# Patient Record
Sex: Female | Born: 1960
Health system: Southern US, Community
[De-identification: ages and names within clinical notes are randomized; demographics above are authoritative.]

## PROBLEM LIST (undated history)

## (undated) DIAGNOSIS — C4490 Unspecified malignant neoplasm of skin, unspecified: Secondary | ICD-10-CM

## (undated) DIAGNOSIS — G709 Myoneural disorder, unspecified: Secondary | ICD-10-CM

## (undated) DIAGNOSIS — R2681 Unsteadiness on feet: Secondary | ICD-10-CM

## (undated) DIAGNOSIS — F329 Major depressive disorder, single episode, unspecified: Secondary | ICD-10-CM

## (undated) DIAGNOSIS — F32A Depression, unspecified: Secondary | ICD-10-CM

## (undated) DIAGNOSIS — G35D Multiple sclerosis, unspecified: Secondary | ICD-10-CM

## (undated) DIAGNOSIS — Z9889 Other specified postprocedural states: Secondary | ICD-10-CM

## (undated) DIAGNOSIS — Z92241 Personal history of systemic steroid therapy: Secondary | ICD-10-CM

## (undated) DIAGNOSIS — G35 Multiple sclerosis: Secondary | ICD-10-CM

## (undated) DIAGNOSIS — R112 Nausea with vomiting, unspecified: Secondary | ICD-10-CM

## (undated) DIAGNOSIS — F419 Anxiety disorder, unspecified: Secondary | ICD-10-CM

## (undated) HISTORY — PX: BREAST EXCISIONAL BIOPSY: SUR124

## (undated) HISTORY — DX: Multiple sclerosis, unspecified: G35.D

## (undated) HISTORY — DX: Other specified postprocedural states: Z98.890

## (undated) HISTORY — PX: MELANOMA EXCISION: SHX5266

## (undated) HISTORY — PX: PARTIAL HYSTERECTOMY: SHX80

## (undated) HISTORY — PX: LEG SURGERY: SHX1003

## (undated) HISTORY — DX: Multiple sclerosis: G35

## (undated) HISTORY — DX: Unspecified malignant neoplasm of skin, unspecified: C44.90

## (undated) HISTORY — PX: TONSILLECTOMY: SUR1361

## (undated) HISTORY — PX: ABDOMINAL HYSTERECTOMY: SHX81

---

## 1998-09-11 ENCOUNTER — Other Ambulatory Visit: Admission: RE | Admit: 1998-09-11 | Discharge: 1998-09-11 | Payer: Self-pay | Admitting: *Deleted

## 2000-03-22 ENCOUNTER — Encounter: Admission: RE | Admit: 2000-03-22 | Discharge: 2000-04-21 | Payer: Self-pay | Admitting: Neurology

## 2000-09-02 ENCOUNTER — Other Ambulatory Visit: Admission: RE | Admit: 2000-09-02 | Discharge: 2000-09-02 | Payer: Self-pay | Admitting: *Deleted

## 2002-07-03 ENCOUNTER — Encounter: Admission: RE | Admit: 2002-07-03 | Discharge: 2002-07-03 | Payer: Self-pay | Admitting: Neurology

## 2002-07-03 ENCOUNTER — Encounter: Payer: Self-pay | Admitting: Neurology

## 2003-05-29 ENCOUNTER — Other Ambulatory Visit: Admission: RE | Admit: 2003-05-29 | Discharge: 2003-05-29 | Payer: Self-pay | Admitting: Obstetrics and Gynecology

## 2003-07-13 ENCOUNTER — Observation Stay (HOSPITAL_COMMUNITY): Admission: AD | Admit: 2003-07-13 | Discharge: 2003-07-14 | Payer: Self-pay | Admitting: Orthopedic Surgery

## 2003-07-13 ENCOUNTER — Encounter (INDEPENDENT_AMBULATORY_CARE_PROVIDER_SITE_OTHER): Payer: Self-pay | Admitting: *Deleted

## 2003-07-13 HISTORY — PX: DEBRIDEMENT SKIN / SQ / MUSCLE OF ARM: SUR392

## 2005-07-23 ENCOUNTER — Other Ambulatory Visit: Admission: RE | Admit: 2005-07-23 | Discharge: 2005-07-23 | Payer: Self-pay | Admitting: Obstetrics and Gynecology

## 2005-09-01 ENCOUNTER — Encounter: Admission: RE | Admit: 2005-09-01 | Discharge: 2005-09-01 | Payer: Self-pay | Admitting: Obstetrics and Gynecology

## 2006-08-26 ENCOUNTER — Encounter: Admission: RE | Admit: 2006-08-26 | Discharge: 2006-08-26 | Payer: Self-pay | Admitting: Obstetrics and Gynecology

## 2006-09-17 ENCOUNTER — Encounter: Admission: RE | Admit: 2006-09-17 | Discharge: 2006-09-17 | Payer: Self-pay | Admitting: Obstetrics and Gynecology

## 2007-10-21 ENCOUNTER — Encounter: Admission: RE | Admit: 2007-10-21 | Discharge: 2007-10-21 | Payer: Self-pay | Admitting: Obstetrics and Gynecology

## 2007-10-25 ENCOUNTER — Encounter: Admission: RE | Admit: 2007-10-25 | Discharge: 2007-10-25 | Payer: Self-pay | Admitting: Obstetrics and Gynecology

## 2008-12-11 ENCOUNTER — Encounter: Admission: RE | Admit: 2008-12-11 | Discharge: 2008-12-11 | Payer: Self-pay | Admitting: Obstetrics and Gynecology

## 2010-09-23 ENCOUNTER — Other Ambulatory Visit: Payer: Self-pay | Admitting: Obstetrics and Gynecology

## 2010-09-23 DIAGNOSIS — Z1231 Encounter for screening mammogram for malignant neoplasm of breast: Secondary | ICD-10-CM

## 2010-10-06 ENCOUNTER — Ambulatory Visit
Admission: RE | Admit: 2010-10-06 | Discharge: 2010-10-06 | Disposition: A | Discharge status: Home / Self Care | Payer: Medicare Other | Source: Ambulatory Visit | Attending: Obstetrics and Gynecology | Admitting: Obstetrics and Gynecology

## 2010-10-06 DIAGNOSIS — Z1231 Encounter for screening mammogram for malignant neoplasm of breast: Secondary | ICD-10-CM

## 2010-11-13 ENCOUNTER — Other Ambulatory Visit: Payer: Self-pay | Admitting: Dermatology

## 2010-11-14 NOTE — Op Note (Signed)
NAMEELAJAH, Tonya Powers                          ACCOUNT NO.:  [id number]   MEDICAL RECORD NO.:  [id number]                   PATIENT TYPE:  AMB   LOCATION:  DAY                                  FACILITY:  [facility]   PHYSICIAN:  Madlyn Frankel. Charlann Boxer, M.D.               DATE OF BIRTH:  06/29/1961   DATE OF PROCEDURE:  07/13/2003  DATE OF DISCHARGE:                                 OPERATIVE REPORT   PREOPERATIVE DIAGNOSIS:  Right arm, rule out abscess versus myonecrosis of  the right triceps.   POSTOPERATIVE DIAGNOSIS/FINDINGS:  No evidence of pocketed abscess fluid.  There was, however, significant fibrotic response to the posterior aspect of  the triceps muscle deep to the fascia and incorporating the fascia, most  likely a response to muscle injury and possible myonecrosis.   PROCEDURE:  I&D of right arm with debridement of 6 x 2 cm fibrotic band of  tissue within the triceps muscle.   SURGEON:  Durene Romans, MD   ASSISTANT:  Clarene Reamer, P.A.-C.   ANESTHESIA:  General.   BLOOD LOSS:  Minimal.   IV FLUIDS:  800.   INDICATION FOR PROCEDURE:  Tonya Powers is a very pleasant 50 year old female  with a medical history significant for multiple sclerosis, requiring  intramuscular injections with her current medication regimen.  She developed  in her right arm over the past 4-6 weeks pain and swelling that was not  necessarily associated with fever nor chills.  She had significant reduction  in her range of motion that was associated with the pain.  She was initially  evaluated, and there was concern for infection in the arm given her symptoms  and history.  Based on this, an MRI was ordered and revealed a rim-enhancing  mass, complex fluid collection, that was about 6 x 2 cm.  Based on this  information and lack of healing response, surgical intervention was  discussed for I&D of right triceps.  She consented for I&D.   PROCEDURE IN DETAIL:  The patient was brought to the operative  theatre.  Once adequate anesthesia was established, the patient was positioned in left  lateral position on the beanbag.  Right upper extremity was then prepped and  draped in a sterile fashion.  Based on the position of the mass which  correlated with her physical exam findings, a longitudinal incision was made  posteriorly along this area to allow for exposure.  Blunt dissection using  the Stevens scissors was carried down to the level of the fascia.  It was  immediately evident that there was significantly abnormal fascia at this  point.  An initial area of fascia was excised which allowed for exposure of  the triceps muscle.  Subfascial dissection was carried out to allow for  adequate exposure.  Upon entry of the fascia, a plane was developed in the  fascia and the triceps muscle to further allow  for identification of this  tissue.  Note that there was no release of any fluids indicating abscess.  More of this was a significant fibrotic band.  There was a large band of  this tissue within the triceps muscle itself.  Using the Metzenbaum  scissors, this area was excised and sent to pathology as well as for Gram  stain culture for evaluation.  Following debridement of all the contents and  confirming the location of this on the MRI, the wound was copiously  irrigated with 3 liters of normal saline solution with pulse lavage followed  by 500 mL Bacitracin-laden normal saline solution.  A medium Hemovac drain  was placed and taken out proximal in the arm.  The remaining fascial layer  was loosely reapproximated using a 2-0 PDS followed by further irrigation  and reapproximating the subcutaneous layer with a 2-0 PDS and 4-0 nylon on  the skin.  Based on the lack of significant abscess and concern for  infection, the whole wound was closed with a medium Hemovac drain placed.  The patient's wound was then dressed sterilely with Adaptic dressing sponges  and placed into a posterior splint.  A  posterior splint will be applied to  allow for the surgical wound to heal.  This will be in place for about 10  days.  The drain will be removed on postop day one.  Gram stain culture will  be followed in the hospital.   The patient tolerated the procedure without complication and was transferred  to the recovery room.                                               Madlyn Frankel Charlann Boxer, M.D.    MDO/MEDQ  D:  07/13/2003  T:  07/13/2003  Job:  [id number]

## 2011-05-15 ENCOUNTER — Other Ambulatory Visit: Payer: Self-pay | Admitting: Dermatology

## 2011-10-23 ENCOUNTER — Other Ambulatory Visit: Payer: Self-pay | Admitting: Obstetrics and Gynecology

## 2011-10-23 DIAGNOSIS — Z1231 Encounter for screening mammogram for malignant neoplasm of breast: Secondary | ICD-10-CM

## 2011-11-05 ENCOUNTER — Ambulatory Visit
Admission: RE | Admit: 2011-11-05 | Discharge: 2011-11-05 | Disposition: A | Discharge status: Home / Self Care | Payer: Medicare Other | Source: Ambulatory Visit | Attending: Obstetrics and Gynecology | Admitting: Obstetrics and Gynecology

## 2011-11-05 ENCOUNTER — Other Ambulatory Visit: Payer: Self-pay | Admitting: Obstetrics and Gynecology

## 2011-11-05 DIAGNOSIS — Z1231 Encounter for screening mammogram for malignant neoplasm of breast: Secondary | ICD-10-CM

## 2012-01-06 ENCOUNTER — Other Ambulatory Visit: Payer: Self-pay | Admitting: Obstetrics and Gynecology

## 2012-04-05 ENCOUNTER — Other Ambulatory Visit: Payer: Self-pay | Admitting: Obstetrics and Gynecology

## 2012-04-05 DIAGNOSIS — D249 Benign neoplasm of unspecified breast: Secondary | ICD-10-CM

## 2012-05-10 ENCOUNTER — Ambulatory Visit
Admission: RE | Admit: 2012-05-10 | Discharge: 2012-05-10 | Disposition: A | Discharge status: Home / Self Care | Payer: Medicare Other | Source: Ambulatory Visit | Attending: Obstetrics and Gynecology | Admitting: Obstetrics and Gynecology

## 2012-05-10 DIAGNOSIS — D249 Benign neoplasm of unspecified breast: Secondary | ICD-10-CM

## 2012-06-10 ENCOUNTER — Other Ambulatory Visit: Payer: Self-pay | Admitting: Dermatology

## 2012-08-02 ENCOUNTER — Ambulatory Visit
Admission: RE | Admit: 2012-08-02 | Discharge: 2012-08-02 | Disposition: A | Discharge status: Home / Self Care | Payer: Medicare Other | Source: Ambulatory Visit | Attending: Family Medicine | Admitting: Family Medicine

## 2012-08-02 ENCOUNTER — Other Ambulatory Visit: Payer: Self-pay | Admitting: Family Medicine

## 2012-08-02 DIAGNOSIS — J189 Pneumonia, unspecified organism: Secondary | ICD-10-CM

## 2012-09-05 ENCOUNTER — Ambulatory Visit (INDEPENDENT_AMBULATORY_CARE_PROVIDER_SITE_OTHER): Payer: Medicare Other | Admitting: General Surgery

## 2012-09-05 ENCOUNTER — Encounter (INDEPENDENT_AMBULATORY_CARE_PROVIDER_SITE_OTHER): Payer: Self-pay | Admitting: General Surgery

## 2012-09-05 VITALS — BP 118/76 | HR 82 | Resp 18 | Ht 65.0 in | Wt 131.0 lb

## 2012-09-05 DIAGNOSIS — N63 Unspecified lump in unspecified breast: Secondary | ICD-10-CM | POA: Insufficient documentation

## 2012-09-05 NOTE — Progress Notes (Signed)
Patient ID: Tonya Powers, female   DOB: [birth year], 52 y.o.   MRN: [id number]  Chief Complaint  Patient presents with  . Other    Eval right br mass    HPI Tonya Powers is a 52 y.o. female.  Referred by Dr Vincente Poli HPI This is a 52 year old female with a history of multiple sclerosis. She requires a cane and most of her symptoms are in her lower extremities. She has had a history of a right breast mass. This was noted in the past and she underwent in May of 2013 an ultrasound as well as a biopsy of a 1.6 cm solid mass. This biopsy was a fibroadenoma. A clip was placed at the same time. She then underwent followup ultrasound 6 months later which showed no significant change in the previously biopsied right breast fibroadenoma. She was recommended bilateral screening mammogram 6 months later. This area to her feels like it is gotten bigger and it is causing her some pain. She is also concerned about the clip that is left in place due to the fact that she has multiple sclerosis and she's had problems with any sort of implant in the past. She comes in today to discuss excision and this is what she desires. Past Medical History  Diagnosis Date  . Multiple sclerosis   . Skin cancer   . History of breast biopsy     Past Surgical History  Procedure Laterality Date  . Leg surgery      sx as a child  . Tonsillectomy      as a child  . Scar tissue removed from arm    . Partial hysterectomy      History reviewed. No pertinent family history.  Social History History  Substance Use Topics  . Smoking status: Never Smoker   . Smokeless tobacco: Not on file  . Alcohol Use: Yes    Allergies  Allergen Reactions  . Codeine Nausea And Vomiting    Current Outpatient Prescriptions  Medication Sig Dispense Refill  . ALPRAZolam (XANAX) 0.5 MG tablet       . amphetamine-dextroamphetamine (ADDERALL) 20 MG tablet       . diazepam (VALIUM) 5 MG tablet Take 5 mg by mouth every 6 (six) hours as  needed for anxiety.      . Dimethyl Fumarate (TECFIDERA) 240 MG CPDR Take by mouth.      . estradiol (VIVELLE-DOT) 0.05 MG/24HR Place 1 patch onto the skin once a week.      . gabapentin (NEURONTIN) 600 MG tablet Take 600 mg by mouth 3 (three) times daily.      . promethazine (PHENERGAN) 25 MG tablet Take 25 mg by mouth every 6 (six) hours as needed for nausea.      . tapentadol (NUCYNTA) 50 MG TABS Take by mouth.      . traMADol (ULTRAM) 50 MG tablet       . traZODone (DESYREL) 50 MG tablet Take 50 mg by mouth at bedtime.      Marland Kitchen zolpidem (AMBIEN) 10 MG tablet Take 10 mg by mouth at bedtime as needed for sleep.       No current facility-administered medications for this visit.    Review of Systems Review of Systems  Constitutional: Positive for fatigue. Negative for fever, chills and unexpected weight change.  HENT: Negative for hearing loss, congestion, sore throat, trouble swallowing and voice change.   Eyes: Negative for visual disturbance.  Respiratory: Negative for cough and  wheezing.   Cardiovascular: Negative for chest pain, palpitations and leg swelling.  Gastrointestinal: Negative for nausea, vomiting, abdominal pain, diarrhea, constipation, blood in stool, abdominal distention and anal bleeding.  Genitourinary: Positive for difficulty urinating. Negative for hematuria and vaginal bleeding.  Musculoskeletal: Negative for arthralgias.  Skin: Negative for rash and wound.  Neurological: Positive for dizziness and headaches. Negative for seizures and syncope.  Hematological: Negative for adenopathy. Bruises/bleeds easily.  Psychiatric/Behavioral: Negative for confusion.    Blood pressure 118/76, pulse 82, resp. rate 18, height 5\' 5"  (1.651 m), weight 131 lb (59.421 kg).  Physical Exam Physical Exam  Vitals reviewed. Constitutional: She appears well-developed and well-nourished.  Neck: Neck supple.  Cardiovascular: Normal rate, regular rhythm and normal heart sounds.     Pulmonary/Chest: Effort normal and breath sounds normal. She has no wheezes. She has no rales. Right breast exhibits mass. Right breast exhibits no inverted nipple, no nipple discharge, no skin change and no tenderness. Left breast exhibits no inverted nipple, no mass, no nipple discharge, no skin change and no tenderness. Breasts are symmetrical.    Lymphadenopathy:    She has no cervical adenopathy.    She has no axillary adenopathy.       Right: No supraclavicular adenopathy present.       Left: No supraclavicular adenopathy present.    Data Reviewed RIGHT BREAST ULTRASOUND  Comparison: Previous examinations, including the right breast  ultrasound and ultrasound guided core needle biopsy dated  11/05/2011.  On physical exam, there is an approximately 1.5 x 1.2 cm oval,  palpable mass in the 12:30 o'clock position of the right breast, 6  cm from the nipple.  Findings: Ultrasound is performed, showing a 1.6 x 1.4 x 0.9 cm  oval, horizontally oriented, smoothly marginated, macrolobulated  solid mass in the 12:30 o'clock position of the right breast, 6 cm  from the nipple. This contains two thin internal septations as  well as a biopsy marker clip artifact. This previously measured  1.6 x 1.3 x 0.9 cm.  IMPRESSION:  No significant change in the previously biopsied right breast  fibroadenoma. No evidence of malignancy. Annual screening  mammography is recommended. The patient will be due for her next  screening mammogram in 6 months.  RECOMMENDATION:  Bilateral screening mammogram in 6 months.   Assessment    Right breast mass     Plan    This area does appear to be a stable fibroadenoma. She thinks it has gotten bigger and it is symptomatic however though. She also is concerned that the clip is causing her some trouble. I told her today that I did not think a clip was causing her symptoms and would be very unlikely that this would cause anything. I do think it is reasonable  to consider excising this area as she is 52 years old with a breast mass that she thinks may be enlarging. I think he would be best to use a wire to ensure I removed the clip at the same time and she very much would like this to be removed also. We discussed a right breast wire localized excision of the mass as well as the clip. We discussed risks including bleeding and infection which is somewhat higher for her given her medical history.       WAKEFIELD,MATTHEW 09/05/2012, 3:53 PM

## 2012-09-13 ENCOUNTER — Encounter (HOSPITAL_BASED_OUTPATIENT_CLINIC_OR_DEPARTMENT_OTHER): Payer: Self-pay | Admitting: *Deleted

## 2012-09-14 ENCOUNTER — Other Ambulatory Visit: Payer: Self-pay

## 2012-09-14 ENCOUNTER — Encounter (HOSPITAL_BASED_OUTPATIENT_CLINIC_OR_DEPARTMENT_OTHER): Payer: Self-pay | Admitting: *Deleted

## 2012-09-14 ENCOUNTER — Encounter (HOSPITAL_BASED_OUTPATIENT_CLINIC_OR_DEPARTMENT_OTHER)
Admission: RE | Admit: 2012-09-14 | Discharge: 2012-09-14 | Disposition: A | Discharge status: Home / Self Care | Payer: Medicare Other | Source: Ambulatory Visit | Attending: General Surgery | Admitting: General Surgery

## 2012-09-14 LAB — CBC
HCT: 39.9 % (ref 36.0–46.0)
MCHC: 35.6 g/dL (ref 30.0–36.0)
Platelets: 229 10*3/uL (ref 150–400)
RDW: 12.6 % (ref 11.5–15.5)
WBC: 6.7 10*3/uL (ref 4.0–10.5)

## 2012-09-14 LAB — BASIC METABOLIC PANEL
BUN: 16 mg/dL (ref 6–23)
Chloride: 100 mEq/L (ref 96–112)
Creatinine, Ser: 0.42 mg/dL — ABNORMAL LOW (ref 0.50–1.10)
GFR calc Af Amer: >90 mL/min (ref >90)
GFR calc non Af Amer: >90 mL/min (ref >90)
Potassium: 3.4 mEq/L — ABNORMAL LOW (ref 3.5–5.1)

## 2012-09-20 ENCOUNTER — Encounter (HOSPITAL_BASED_OUTPATIENT_CLINIC_OR_DEPARTMENT_OTHER): Payer: Self-pay

## 2012-09-20 ENCOUNTER — Encounter (HOSPITAL_BASED_OUTPATIENT_CLINIC_OR_DEPARTMENT_OTHER)
Admission: RE | Disposition: A | Discharge status: Home / Self Care | Payer: Self-pay | Source: Ambulatory Visit | Attending: General Surgery

## 2012-09-20 ENCOUNTER — Ambulatory Visit
Admission: RE | Admit: 2012-09-20 | Discharge: 2012-09-20 | Disposition: A | Discharge status: Home / Self Care | Payer: Medicare Other | Source: Ambulatory Visit

## 2012-09-20 ENCOUNTER — Ambulatory Visit (HOSPITAL_BASED_OUTPATIENT_CLINIC_OR_DEPARTMENT_OTHER)
Admission: RE | Admit: 2012-09-20 | Discharge: 2012-09-20 | Disposition: A | Discharge status: Home / Self Care | Payer: Medicare Other | Source: Ambulatory Visit | Attending: General Surgery | Admitting: General Surgery

## 2012-09-20 ENCOUNTER — Ambulatory Visit (HOSPITAL_BASED_OUTPATIENT_CLINIC_OR_DEPARTMENT_OTHER): Payer: Medicare Other | Admitting: Anesthesiology

## 2012-09-20 ENCOUNTER — Other Ambulatory Visit (INDEPENDENT_AMBULATORY_CARE_PROVIDER_SITE_OTHER): Payer: Self-pay | Admitting: General Surgery

## 2012-09-20 ENCOUNTER — Ambulatory Visit
Admission: RE | Admit: 2012-09-20 | Discharge: 2012-09-20 | Disposition: A | Discharge status: Home / Self Care | Payer: Medicare Other | Source: Ambulatory Visit | Attending: General Surgery | Admitting: General Surgery

## 2012-09-20 ENCOUNTER — Encounter (HOSPITAL_BASED_OUTPATIENT_CLINIC_OR_DEPARTMENT_OTHER): Payer: Self-pay | Admitting: Anesthesiology

## 2012-09-20 DIAGNOSIS — N6019 Diffuse cystic mastopathy of unspecified breast: Secondary | ICD-10-CM | POA: Insufficient documentation

## 2012-09-20 DIAGNOSIS — D249 Benign neoplasm of unspecified breast: Secondary | ICD-10-CM | POA: Insufficient documentation

## 2012-09-20 DIAGNOSIS — N63 Unspecified lump in unspecified breast: Secondary | ICD-10-CM

## 2012-09-20 DIAGNOSIS — N6310 Unspecified lump in the right breast, unspecified quadrant: Secondary | ICD-10-CM

## 2012-09-20 DIAGNOSIS — Z79899 Other long term (current) drug therapy: Secondary | ICD-10-CM | POA: Insufficient documentation

## 2012-09-20 DIAGNOSIS — Z885 Allergy status to narcotic agent status: Secondary | ICD-10-CM | POA: Insufficient documentation

## 2012-09-20 DIAGNOSIS — Z85828 Personal history of other malignant neoplasm of skin: Secondary | ICD-10-CM | POA: Insufficient documentation

## 2012-09-20 DIAGNOSIS — G35 Multiple sclerosis: Secondary | ICD-10-CM | POA: Insufficient documentation

## 2012-09-20 HISTORY — DX: Myoneural disorder, unspecified: G70.9

## 2012-09-20 HISTORY — DX: Depression, unspecified: F32.A

## 2012-09-20 HISTORY — PX: BREAST BIOPSY: SHX20

## 2012-09-20 HISTORY — DX: Nausea with vomiting, unspecified: R11.2

## 2012-09-20 HISTORY — DX: Nausea with vomiting, unspecified: Z98.890

## 2012-09-20 HISTORY — DX: Anxiety disorder, unspecified: F41.9

## 2012-09-20 HISTORY — DX: Major depressive disorder, single episode, unspecified: F32.9

## 2012-09-20 SURGERY — BREAST BIOPSY WITH NEEDLE LOCALIZATION
Anesthesia: General | Site: Breast | Laterality: Right | Wound class: Clean

## 2012-09-20 MED ORDER — MEPERIDINE HCL 50 MG PO TABS: 50.0000 mg | ORAL_TABLET | Freq: Once | ORAL | Status: DC

## 2012-09-20 MED ORDER — BUPIVACAINE HCL (PF) 0.25 % IJ SOLN
INTRAMUSCULAR | Status: DC | PRN
  Administered 2012-09-20: 10 mL

## 2012-09-20 MED ORDER — PROMETHAZINE HCL 12.5 MG PO TABS: 12.5000 mg | ORAL_TABLET | Freq: Four times a day (QID) | ORAL | Status: DC | PRN

## 2012-09-20 MED ORDER — DEXAMETHASONE SODIUM PHOSPHATE 4 MG/ML IJ SOLN
INTRAMUSCULAR | Status: DC | PRN
  Administered 2012-09-20: 10 mg via INTRAVENOUS

## 2012-09-20 MED ORDER — MIDAZOLAM HCL 2 MG/2ML IJ SOLN: 1.0000 mg | INTRAMUSCULAR | Status: DC | PRN

## 2012-09-20 MED ORDER — ONDANSETRON HCL 4 MG/2ML IJ SOLN
INTRAMUSCULAR | Status: DC | PRN
  Administered 2012-09-20: 4 mg via INTRAVENOUS

## 2012-09-20 MED ORDER — ONDANSETRON HCL 4 MG/2ML IJ SOLN: 4.0000 mg | Freq: Once | INTRAMUSCULAR | Status: DC | PRN

## 2012-09-20 MED ORDER — LIDOCAINE HCL (CARDIAC) 20 MG/ML IV SOLN
INTRAVENOUS | Status: DC | PRN
  Administered 2012-09-20: 80 mg via INTRAVENOUS

## 2012-09-20 MED ORDER — FENTANYL CITRATE 0.05 MG/ML IJ SOLN
INTRAMUSCULAR | Status: DC | PRN
  Administered 2012-09-20: 50 ug via INTRAVENOUS

## 2012-09-20 MED ORDER — PROPOFOL 10 MG/ML IV BOLUS
INTRAVENOUS | Status: DC | PRN
  Administered 2012-09-20: 180 mg via INTRAVENOUS

## 2012-09-20 MED ORDER — LACTATED RINGERS IV SOLN
INTRAVENOUS | Status: DC
  Administered 2012-09-20 (×2): via INTRAVENOUS

## 2012-09-20 MED ORDER — OXYCODONE HCL 5 MG PO TABS: 5.0000 mg | ORAL_TABLET | Freq: Once | ORAL | Status: DC | PRN

## 2012-09-20 MED ORDER — MIDAZOLAM HCL 5 MG/5ML IJ SOLN
INTRAMUSCULAR | Status: DC | PRN
  Administered 2012-09-20: 2 mg via INTRAVENOUS

## 2012-09-20 MED ORDER — SCOPOLAMINE 1 MG/3DAYS TD PT72
1.0000 | MEDICATED_PATCH | TRANSDERMAL | Status: DC
  Administered 2012-09-20: 1.5 mg via TRANSDERMAL

## 2012-09-20 MED ORDER — EPHEDRINE SULFATE 50 MG/ML IJ SOLN
INTRAMUSCULAR | Status: DC | PRN
  Administered 2012-09-20: 10 mg via INTRAVENOUS

## 2012-09-20 MED ORDER — MEPERIDINE HCL 50 MG PO TABS: 50.0000 mg | ORAL_TABLET | Freq: Four times a day (QID) | ORAL | Status: DC | PRN

## 2012-09-20 MED ORDER — PROMETHAZINE HCL 25 MG PO TABS: 25.0000 mg | ORAL_TABLET | Freq: Four times a day (QID) | ORAL | Status: DC | PRN

## 2012-09-20 MED ORDER — FENTANYL CITRATE 0.05 MG/ML IJ SOLN: 50.0000 ug | INTRAMUSCULAR | Status: DC | PRN

## 2012-09-20 MED ORDER — OXYCODONE HCL 5 MG/5ML PO SOLN: 5.0000 mg | Freq: Once | ORAL | Status: DC | PRN

## 2012-09-20 MED ORDER — HYDROMORPHONE HCL PF 1 MG/ML IJ SOLN: 0.2500 mg | INTRAMUSCULAR | Status: DC | PRN

## 2012-09-20 MED ORDER — CEFAZOLIN SODIUM-DEXTROSE 2-3 GM-% IV SOLR
2.0000 g | INTRAVENOUS | Status: AC
  Administered 2012-09-20: 2 g via INTRAVENOUS

## 2012-09-20 SURGICAL SUPPLY — 54 items
ADH SKN CLS APL DERMABOND .7 (GAUZE/BANDAGES/DRESSINGS) ×1
APL SKNCLS STERI-STRIP NONHPOA (GAUZE/BANDAGES/DRESSINGS)
APPLIER CLIP 9.375 MED OPEN (MISCELLANEOUS)
APR CLP MED 9.3 20 MLT OPN (MISCELLANEOUS)
BENZOIN TINCTURE PRP APPL 2/3 (GAUZE/BANDAGES/DRESSINGS) ×1 IMPLANT
BINDER BREAST LRG (GAUZE/BANDAGES/DRESSINGS) IMPLANT
BINDER BREAST MEDIUM (GAUZE/BANDAGES/DRESSINGS) ×1 IMPLANT
BINDER BREAST XLRG (GAUZE/BANDAGES/DRESSINGS) IMPLANT
BINDER BREAST XXLRG (GAUZE/BANDAGES/DRESSINGS) IMPLANT
BLADE SURG 15 STRL LF DISP TIS (BLADE) ×1 IMPLANT
BLADE SURG 15 STRL SS (BLADE) ×2
CANISTER SUCTION 1200CC (MISCELLANEOUS) ×1 IMPLANT
CHLORAPREP W/TINT 26ML (MISCELLANEOUS) ×2 IMPLANT
CLIP APPLIE 9.375 MED OPEN (MISCELLANEOUS) IMPLANT
CLOTH BEACON ORANGE TIMEOUT ST (SAFETY) ×2 IMPLANT
COVER MAYO STAND STRL (DRAPES) ×2 IMPLANT
COVER TABLE BACK 60X90 (DRAPES) ×2 IMPLANT
DECANTER SPIKE VIAL GLASS SM (MISCELLANEOUS) IMPLANT
DERMABOND ADVANCED (GAUZE/BANDAGES/DRESSINGS) ×1
DERMABOND ADVANCED .7 DNX12 (GAUZE/BANDAGES/DRESSINGS) IMPLANT
DEVICE DUBIN W/COMP PLATE 8390 (MISCELLANEOUS) ×1 IMPLANT
DRAPE PED LAPAROTOMY (DRAPES) ×2 IMPLANT
DRSG TEGADERM 4X4.75 (GAUZE/BANDAGES/DRESSINGS) ×1 IMPLANT
ELECT COATED BLADE 2.86 ST (ELECTRODE) ×2 IMPLANT
ELECT REM PT RETURN 9FT ADLT (ELECTROSURGICAL) ×2
ELECTRODE REM PT RTRN 9FT ADLT (ELECTROSURGICAL) ×1 IMPLANT
GAUZE SPONGE 4X4 12PLY STRL LF (GAUZE/BANDAGES/DRESSINGS) ×2 IMPLANT
GLOVE BIO SURGEON STRL SZ7 (GLOVE) ×3 IMPLANT
GLOVE BIOGEL PI IND STRL 7.5 (GLOVE) ×1 IMPLANT
GLOVE BIOGEL PI INDICATOR 7.5 (GLOVE) ×1
GOWN PREVENTION PLUS XLARGE (GOWN DISPOSABLE) ×2 IMPLANT
GOWN PREVENTION PLUS XXLARGE (GOWN DISPOSABLE) ×1 IMPLANT
NDL HYPO 25X1 1.5 SAFETY (NEEDLE) ×1 IMPLANT
NEEDLE HYPO 25X1 1.5 SAFETY (NEEDLE) ×2 IMPLANT
NS IRRIG 1000ML POUR BTL (IV SOLUTION) IMPLANT
PACK BASIN DAY SURGERY FS (CUSTOM PROCEDURE TRAY) ×2 IMPLANT
PENCIL BUTTON HOLSTER BLD 10FT (ELECTRODE) ×2 IMPLANT
SLEEVE SCD COMPRESS KNEE MED (MISCELLANEOUS) ×2 IMPLANT
SPONGE LAP 4X18 X RAY DECT (DISPOSABLE) ×2 IMPLANT
STRIP CLOSURE SKIN 1/2X4 (GAUZE/BANDAGES/DRESSINGS) ×2 IMPLANT
SUT MNCRL AB 4-0 PS2 18 (SUTURE) IMPLANT
SUT MON AB 5-0 PS2 18 (SUTURE) IMPLANT
SUT SILK 2 0 SH (SUTURE) ×2 IMPLANT
SUT VIC AB 2-0 SH 27 (SUTURE) ×2
SUT VIC AB 2-0 SH 27XBRD (SUTURE) ×1 IMPLANT
SUT VIC AB 3-0 SH 27 (SUTURE) ×2
SUT VIC AB 3-0 SH 27X BRD (SUTURE) ×1 IMPLANT
SUT VIC AB 5-0 PS2 18 (SUTURE) IMPLANT
SUT VICRYL AB 3 0 TIES (SUTURE) IMPLANT
SYR CONTROL 10ML LL (SYRINGE) ×2 IMPLANT
TOWEL OR 17X24 6PK STRL BLUE (TOWEL DISPOSABLE) ×2 IMPLANT
TOWEL OR NON WOVEN STRL DISP B (DISPOSABLE) ×2 IMPLANT
TUBE CONNECTING 20X1/4 (TUBING) ×1 IMPLANT
YANKAUER SUCT BULB TIP NO VENT (SUCTIONS) ×1 IMPLANT

## 2012-09-20 NOTE — Anesthesia Preprocedure Evaluation (Addendum)
Anesthesia Evaluation  Patient identified by MRN, date of birth, ID band Patient awake    Reviewed: Allergy & Precautions, H&P , NPO status , Patient's Chart, lab work & pertinent test results  History of Anesthesia Complications (+) PONV  Airway Mallampati: I TM Distance: >3 FB Neck ROM: Full    Dental  (+) Teeth Intact and Dental Advisory Given   Pulmonary  breath sounds clear to auscultation        Cardiovascular Rhythm:Regular Rate:Normal     Neuro/Psych    GI/Hepatic   Endo/Other    Renal/GU      Musculoskeletal   Abdominal   Peds  Hematology   Anesthesia Other Findings   Reproductive/Obstetrics                           Anesthesia Physical Anesthesia Plan  ASA: II  Anesthesia Plan: General   Post-op Pain Management:    Induction: Intravenous  Airway Management Planned: LMA  Additional Equipment:   Intra-op Plan:   Post-operative Plan: Extubation in OR  Informed Consent: I have reviewed the patients History and Physical, chart, labs and discussed the procedure including the risks, benefits and alternatives for the proposed anesthesia with the patient or authorized representative who has indicated his/her understanding and acceptance.   Dental advisory given  Plan Discussed with: CRNA, Anesthesiologist and Surgeon  Anesthesia Plan Comments:         Anesthesia Quick Evaluation  

## 2012-09-20 NOTE — Anesthesia Postprocedure Evaluation (Signed)
  Anesthesia Post-op Note  Patient: Tonya Powers  Procedure(s) Performed: Procedure(s): BREAST BIOPSY WITH NEEDLE LOCALIZATION (Right)  Patient Location: PACU  Anesthesia Type:General  Level of Consciousness: awake, alert  and oriented  Airway and Oxygen Therapy: Patient Spontanous Breathing  Post-op Pain: mild  Post-op Assessment: Post-op Vital signs reviewed  Post-op Vital Signs: Reviewed  Complications: No apparent anesthesia complications

## 2012-09-20 NOTE — Interval H&P Note (Signed)
History and Physical Interval Note:  09/20/2012 10:51 AM  Tonya Powers  has presented today for surgery, with the diagnosis of right breast mass  The various methods of treatment have been discussed with the patient and family. After consideration of risks, benefits and other options for treatment, the patient has consented to  Procedure(s): BREAST BIOPSY WITH NEEDLE LOCALIZATION (Right) as a surgical intervention .  The patient's history has been reviewed, patient examined, no change in status, stable for surgery.  I have reviewed the patient's chart and labs.  Questions were answered to the patient's satisfaction.     Manas Hickling

## 2012-09-20 NOTE — Transfer of Care (Signed)
Immediate Anesthesia Transfer of Care Note  Patient: Tonya Powers  Procedure(s) Performed: Procedure(s): BREAST BIOPSY WITH NEEDLE LOCALIZATION (Right)  Patient Location: PACU  Anesthesia Type:General  Level of Consciousness: awake, oriented and patient cooperative  Airway & Oxygen Therapy: Patient Spontanous Breathing and Patient connected to face mask oxygen  Post-op Assessment: Report given to PACU RN and Post -op Vital signs reviewed and stable  Post vital signs: Reviewed and stable  Complications: No apparent anesthesia complications

## 2012-09-20 NOTE — Anesthesia Procedure Notes (Signed)
Procedure Name: LMA Insertion Date/Time: 09/20/2012 11:17 AM Performed by: Gar Gibbon Pre-anesthesia Checklist: Patient identified, Emergency Drugs available, Suction available and Patient being monitored Patient Re-evaluated:Patient Re-evaluated prior to inductionOxygen Delivery Method: Circle System Utilized Preoxygenation: Pre-oxygenation with 100% oxygen Intubation Type: IV induction Ventilation: Mask ventilation without difficulty LMA: LMA inserted LMA Size: 4.0 Number of attempts: 1 Airway Equipment and Method: bite block Placement Confirmation: positive ETCO2 Tube secured with: Tape Dental Injury: Teeth and Oropharynx as per pre-operative assessment

## 2012-09-20 NOTE — Op Note (Signed)
Preoperative diagnosis: Right breast mass with prior core biopsy Postoperative diagnosis: Same as above Procedure: Right breast mass wire-guided excisional biopsy Surgeon: Dr. Harden Mo Anesthesia: Gen. With LMA Estimated blood loss: Minimal Specimens: Right breast tissue with a short stitch superior, long stitch lateral, double stitch deep Complications: None Drains: None Sponge needle count correct at end of operation Disposition to recovery room in stable condition  Indications: This is a 52 year old female had a right breast mass noted as a prior benign biopsy with clip placement. She also has a history of multiple sclerosis and was concerned about the clip being in her We had a long discussion about her options and she very much would like both the mass and the clip removed at the same time. We elected to do a wire-guided biopsy to insure that the clip was removed at the same time.  Procedure: After informed consent was obtained the patient first had a wire placed at the breast center. I had these mammograms available for my review. She was administered 2 g of intravenous cefazolin. Sequential compression devices were placed on her legs. She then underwent general anesthesia with an LMA. Her right breast was prepped and draped in the standard sterile surgical fashion. Surgical timeout was performed.  I made a curvilinear incision overlying the mass. I then brought the wire into about 5 cm from away. I then used cautery to remove the mass and the surrounding tissue. The wire I cut off at the specimen. Once I had removed this I obtained a mammogram. Radiology also reviewed this. The clip, mass, and wire were all present in the sample. Hemostasis is then obtained. I closed the breast tissue with 2-0 Vicryl. The dermis was closed with 3-0 Vicryl and the skin with 4-0 Monocryl. I infiltrated 10 cc of quarter percent Marcaine. I then placed Dermabond and Steri-Strips. She tolerated this well was  extubated and transferred to the recovery room in stable condition.

## 2012-09-20 NOTE — H&P (View-Only) (Signed)
Patient ID: Tonya Powers, female   DOB: [birth year], 52 y.o.   MRN: [id number]  Chief Complaint  Patient presents with  . Other    Eval right br mass    HPI Tonya Powers is a 52 y.o. female.  Referred by Dr Vincente Poli HPI This is a 52 year old female with a history of multiple sclerosis. She requires a cane and most of her symptoms are in her lower extremities. She has had a history of a right breast mass. This was noted in the past and she underwent in May of 2013 an ultrasound as well as a biopsy of a 1.6 cm solid mass. This biopsy was a fibroadenoma. A clip was placed at the same time. She then underwent followup ultrasound 6 months later which showed no significant change in the previously biopsied right breast fibroadenoma. She was recommended bilateral screening mammogram 6 months later. This area to her feels like it is gotten bigger and it is causing her some pain. She is also concerned about the clip that is left in place due to the fact that she has multiple sclerosis and she's had problems with any sort of implant in the past. She comes in today to discuss excision and this is what she desires. Past Medical History  Diagnosis Date  . Multiple sclerosis   . Skin cancer   . History of breast biopsy     Past Surgical History  Procedure Laterality Date  . Leg surgery      sx as a child  . Tonsillectomy      as a child  . Scar tissue removed from arm    . Partial hysterectomy      History reviewed. No pertinent family history.  Social History History  Substance Use Topics  . Smoking status: Never Smoker   . Smokeless tobacco: Not on file  . Alcohol Use: Yes    Allergies  Allergen Reactions  . Codeine Nausea And Vomiting    Current Outpatient Prescriptions  Medication Sig Dispense Refill  . ALPRAZolam (XANAX) 0.5 MG tablet       . amphetamine-dextroamphetamine (ADDERALL) 20 MG tablet       . diazepam (VALIUM) 5 MG tablet Take 5 mg by mouth every 6 (six) hours as  needed for anxiety.      . Dimethyl Fumarate (TECFIDERA) 240 MG CPDR Take by mouth.      . estradiol (VIVELLE-DOT) 0.05 MG/24HR Place 1 patch onto the skin once a week.      . gabapentin (NEURONTIN) 600 MG tablet Take 600 mg by mouth 3 (three) times daily.      . promethazine (PHENERGAN) 25 MG tablet Take 25 mg by mouth every 6 (six) hours as needed for nausea.      . tapentadol (NUCYNTA) 50 MG TABS Take by mouth.      . traMADol (ULTRAM) 50 MG tablet       . traZODone (DESYREL) 50 MG tablet Take 50 mg by mouth at bedtime.      Marland Kitchen zolpidem (AMBIEN) 10 MG tablet Take 10 mg by mouth at bedtime as needed for sleep.       No current facility-administered medications for this visit.    Review of Systems Review of Systems  Constitutional: Positive for fatigue. Negative for fever, chills and unexpected weight change.  HENT: Negative for hearing loss, congestion, sore throat, trouble swallowing and voice change.   Eyes: Negative for visual disturbance.  Respiratory: Negative for cough and  wheezing.   Cardiovascular: Negative for chest pain, palpitations and leg swelling.  Gastrointestinal: Negative for nausea, vomiting, abdominal pain, diarrhea, constipation, blood in stool, abdominal distention and anal bleeding.  Genitourinary: Positive for difficulty urinating. Negative for hematuria and vaginal bleeding.  Musculoskeletal: Negative for arthralgias.  Skin: Negative for rash and wound.  Neurological: Positive for dizziness and headaches. Negative for seizures and syncope.  Hematological: Negative for adenopathy. Bruises/bleeds easily.  Psychiatric/Behavioral: Negative for confusion.    Blood pressure 118/76, pulse 82, resp. rate 18, height 5\' 5"  (1.651 m), weight 131 lb (59.421 kg).  Physical Exam Physical Exam  Vitals reviewed. Constitutional: She appears well-developed and well-nourished.  Neck: Neck supple.  Cardiovascular: Normal rate, regular rhythm and normal heart sounds.     Pulmonary/Chest: Effort normal and breath sounds normal. She has no wheezes. She has no rales. Right breast exhibits mass. Right breast exhibits no inverted nipple, no nipple discharge, no skin change and no tenderness. Left breast exhibits no inverted nipple, no mass, no nipple discharge, no skin change and no tenderness. Breasts are symmetrical.    Lymphadenopathy:    She has no cervical adenopathy.    She has no axillary adenopathy.       Right: No supraclavicular adenopathy present.       Left: No supraclavicular adenopathy present.    Data Reviewed RIGHT BREAST ULTRASOUND  Comparison: Previous examinations, including the right breast  ultrasound and ultrasound guided core needle biopsy dated  11/05/2011.  On physical exam, there is an approximately 1.5 x 1.2 cm oval,  palpable mass in the 12:30 o'clock position of the right breast, 6  cm from the nipple.  Findings: Ultrasound is performed, showing a 1.6 x 1.4 x 0.9 cm  oval, horizontally oriented, smoothly marginated, macrolobulated  solid mass in the 12:30 o'clock position of the right breast, 6 cm  from the nipple. This contains two thin internal septations as  well as a biopsy marker clip artifact. This previously measured  1.6 x 1.3 x 0.9 cm.  IMPRESSION:  No significant change in the previously biopsied right breast  fibroadenoma. No evidence of malignancy. Annual screening  mammography is recommended. The patient will be due for her next  screening mammogram in 6 months.  RECOMMENDATION:  Bilateral screening mammogram in 6 months.   Assessment    Right breast mass     Plan    This area does appear to be a stable fibroadenoma. She thinks it has gotten bigger and it is symptomatic however though. She also is concerned that the clip is causing her some trouble. I told her today that I did not think a clip was causing her symptoms and would be very unlikely that this would cause anything. I do think it is reasonable  to consider excising this area as she is 52 years old with a breast mass that she thinks may be enlarging. I think he would be best to use a wire to ensure I removed the clip at the same time and she very much would like this to be removed also. We discussed a right breast wire localized excision of the mass as well as the clip. We discussed risks including bleeding and infection which is somewhat higher for her given her medical history.       WAKEFIELD,MATTHEW 09/05/2012, 3:53 PM

## 2012-09-21 ENCOUNTER — Other Ambulatory Visit (INDEPENDENT_AMBULATORY_CARE_PROVIDER_SITE_OTHER): Payer: Self-pay | Admitting: General Surgery

## 2012-09-21 ENCOUNTER — Encounter (HOSPITAL_BASED_OUTPATIENT_CLINIC_OR_DEPARTMENT_OTHER): Payer: Self-pay | Admitting: General Surgery

## 2012-09-21 DIAGNOSIS — N63 Unspecified lump in unspecified breast: Secondary | ICD-10-CM

## 2012-09-22 ENCOUNTER — Other Ambulatory Visit (INDEPENDENT_AMBULATORY_CARE_PROVIDER_SITE_OTHER): Payer: Self-pay | Admitting: General Surgery

## 2012-09-22 ENCOUNTER — Telehealth (INDEPENDENT_AMBULATORY_CARE_PROVIDER_SITE_OTHER): Payer: Self-pay

## 2012-09-22 DIAGNOSIS — N6310 Unspecified lump in the right breast, unspecified quadrant: Secondary | ICD-10-CM

## 2012-09-22 NOTE — Telephone Encounter (Signed)
Called pt to let her know that her pathology report shows a fibroadenoma per Dr Dwain Sarna.

## 2012-09-30 ENCOUNTER — Ambulatory Visit (INDEPENDENT_AMBULATORY_CARE_PROVIDER_SITE_OTHER): Payer: Medicare Other | Admitting: General Surgery

## 2012-09-30 ENCOUNTER — Encounter (INDEPENDENT_AMBULATORY_CARE_PROVIDER_SITE_OTHER): Payer: Self-pay | Admitting: General Surgery

## 2012-09-30 VITALS — BP 112/70 | HR 84 | Temp 98.4°F | Resp 16 | Ht 65.0 in | Wt 130.0 lb

## 2012-09-30 DIAGNOSIS — Z09 Encounter for follow-up examination after completed treatment for conditions other than malignant neoplasm: Secondary | ICD-10-CM

## 2012-09-30 NOTE — Patient Instructions (Signed)

## 2012-09-30 NOTE — Progress Notes (Signed)
Subjective:     Patient ID: Tonya Powers, female   DOB: [birth year], 52 y.o.   MRN: [id number]  HPI 69 yof with right breast mass and clip from prior biopsy with history of ms.  I did wire localized biopsy of this area with removal of mass and clip.  She returns doing well today without any real complaints.  Her ms has flared postop.  Review of Systems     Objective:   Physical Exam Healing incision without infection    Assessment:     S/p right breast biopsy     Plan:     We discussed pathology and that clip was removed.  She can do all normal activity. Continue regular breast cancer screening and I will see back as needed

## 2013-01-19 ENCOUNTER — Other Ambulatory Visit: Payer: Self-pay | Admitting: Dermatology

## 2013-05-04 ENCOUNTER — Ambulatory Visit
Admission: RE | Admit: 2013-05-04 | Discharge: 2013-05-04 | Disposition: A | Discharge status: Home / Self Care | Payer: Medicare Other | Source: Ambulatory Visit | Attending: Family Medicine | Admitting: Family Medicine

## 2013-05-04 ENCOUNTER — Other Ambulatory Visit: Payer: Self-pay | Admitting: Family Medicine

## 2013-05-04 DIAGNOSIS — R05 Cough: Secondary | ICD-10-CM

## 2013-05-04 DIAGNOSIS — R059 Cough, unspecified: Secondary | ICD-10-CM

## 2014-02-02 ENCOUNTER — Other Ambulatory Visit: Payer: Self-pay

## 2014-02-02 DIAGNOSIS — Z1231 Encounter for screening mammogram for malignant neoplasm of breast: Secondary | ICD-10-CM

## 2014-02-09 ENCOUNTER — Ambulatory Visit
Admission: RE | Admit: 2014-02-09 | Discharge: 2014-02-09 | Disposition: A | Discharge status: Home / Self Care | Payer: Medicare Other | Source: Ambulatory Visit

## 2014-02-09 DIAGNOSIS — Z1231 Encounter for screening mammogram for malignant neoplasm of breast: Secondary | ICD-10-CM

## 2014-02-16 ENCOUNTER — Other Ambulatory Visit: Payer: Self-pay | Admitting: Dermatology

## 2014-06-12 ENCOUNTER — Other Ambulatory Visit: Payer: Self-pay | Admitting: Obstetrics and Gynecology

## 2014-06-13 LAB — CYTOLOGY - PAP

## 2014-06-15 ENCOUNTER — Other Ambulatory Visit: Payer: Self-pay | Admitting: Gastroenterology

## 2014-06-28 ENCOUNTER — Encounter (HOSPITAL_COMMUNITY): Payer: Self-pay | Admitting: *Deleted

## 2014-07-02 ENCOUNTER — Encounter (HOSPITAL_COMMUNITY): Payer: Self-pay | Admitting: *Deleted

## 2014-07-03 ENCOUNTER — Encounter (HOSPITAL_COMMUNITY): Payer: Self-pay | Admitting: *Deleted

## 2014-07-13 ENCOUNTER — Ambulatory Visit (HOSPITAL_COMMUNITY)
Admission: RE | Admit: 2014-07-13 | Discharge: 2014-07-13 | Disposition: A | Discharge status: Home / Self Care | Payer: Medicare Other | Source: Ambulatory Visit | Attending: Gastroenterology | Admitting: Gastroenterology

## 2014-07-13 ENCOUNTER — Ambulatory Visit (HOSPITAL_COMMUNITY): Payer: Medicare Other | Admitting: Certified Registered"

## 2014-07-13 ENCOUNTER — Encounter (HOSPITAL_COMMUNITY): Payer: Self-pay | Admitting: *Deleted

## 2014-07-13 ENCOUNTER — Encounter (HOSPITAL_COMMUNITY)
Admission: RE | Disposition: A | Discharge status: Home / Self Care | Payer: Self-pay | Source: Ambulatory Visit | Attending: Gastroenterology

## 2014-07-13 DIAGNOSIS — D120 Benign neoplasm of cecum: Secondary | ICD-10-CM | POA: Diagnosis not present

## 2014-07-13 DIAGNOSIS — R195 Other fecal abnormalities: Secondary | ICD-10-CM | POA: Diagnosis present

## 2014-07-13 DIAGNOSIS — K6389 Other specified diseases of intestine: Secondary | ICD-10-CM | POA: Diagnosis not present

## 2014-07-13 DIAGNOSIS — F419 Anxiety disorder, unspecified: Secondary | ICD-10-CM | POA: Diagnosis not present

## 2014-07-13 DIAGNOSIS — G35 Multiple sclerosis: Secondary | ICD-10-CM | POA: Diagnosis not present

## 2014-07-13 DIAGNOSIS — Z885 Allergy status to narcotic agent status: Secondary | ICD-10-CM | POA: Diagnosis not present

## 2014-07-13 DIAGNOSIS — Z85828 Personal history of other malignant neoplasm of skin: Secondary | ICD-10-CM | POA: Insufficient documentation

## 2014-07-13 DIAGNOSIS — F329 Major depressive disorder, single episode, unspecified: Secondary | ICD-10-CM | POA: Diagnosis not present

## 2014-07-13 DIAGNOSIS — D123 Benign neoplasm of transverse colon: Secondary | ICD-10-CM | POA: Diagnosis not present

## 2014-07-13 DIAGNOSIS — R2689 Other abnormalities of gait and mobility: Secondary | ICD-10-CM | POA: Insufficient documentation

## 2014-07-13 HISTORY — DX: Unsteadiness on feet: R26.81

## 2014-07-13 HISTORY — DX: Personal history of systemic steroid therapy: Z92.241

## 2014-07-13 HISTORY — PX: COLONOSCOPY WITH PROPOFOL: SHX5780

## 2014-07-13 SURGERY — COLONOSCOPY WITH PROPOFOL
Anesthesia: Monitor Anesthesia Care

## 2014-07-13 MED ORDER — SODIUM CHLORIDE 0.9 % IV SOLN: INTRAVENOUS | Status: DC

## 2014-07-13 MED ORDER — PROPOFOL 10 MG/ML IV BOLUS
INTRAVENOUS | Status: AC
  Filled 2014-07-13: qty 20

## 2014-07-13 MED ORDER — PROPOFOL 10 MG/ML IV BOLUS
INTRAVENOUS | Status: AC
Start: 2014-07-13 — End: 2014-07-13
  Filled 2014-07-13: qty 20

## 2014-07-13 MED ORDER — LIDOCAINE HCL (PF) 2 % IJ SOLN
INTRAMUSCULAR | Status: DC | PRN
  Administered 2014-07-13: 20 mg via INTRADERMAL

## 2014-07-13 MED ORDER — LACTATED RINGERS IV SOLN
INTRAVENOUS | Status: DC
  Administered 2014-07-13: 1000 mL via INTRAVENOUS

## 2014-07-13 MED ORDER — PROPOFOL 10 MG/ML IV BOLUS
INTRAVENOUS | Status: DC | PRN
  Administered 2014-07-13: 100 mg via INTRAVENOUS
  Administered 2014-07-13 (×2): 50 mg via INTRAVENOUS

## 2014-07-13 SURGICAL SUPPLY — 22 items

## 2014-07-13 NOTE — Anesthesia Postprocedure Evaluation (Signed)
  Anesthesia Post-op Note  Patient: Tonya Powers  Procedure(s) Performed: Procedure(s): COLONOSCOPY WITH PROPOFOL (N/A) Patient is awake and responsive. Pain and nausea are reasonably well controlled. Vital signs are stable and clinically acceptable. Oxygen saturation is clinically acceptable. There are no apparent anesthetic complications at this time. Patient is ready for discharge.

## 2014-07-13 NOTE — H&P (Signed)
  Tonya Powers HPI: This is a 54 year old female who was identified to have a positive stool DNA test on routine testing.  She is here today to undergo further evaluation with a colonoscopy.  Past Medical History  Diagnosis Date  . Multiple sclerosis   . Skin cancer   . History of breast biopsy   . PONV (postoperative nausea and vomiting)   . Anxiety   . Depression   . Neuromuscular disorder     MS  . Gait instability     uses wheelchair or assistance  . H/O steroid therapy     IV infusion every 6 to 8 weeks- last 05-31-14 [facility]    Past Surgical History  Procedure Laterality Date  . Leg surgery      sx as a child  . Tonsillectomy      as a child  . Partial hysterectomy    . Debridement skin / sq / muscle of arm Right 07/13/2003    I & D; debridement of tissue within triceps muscle  . Abdominal hysterectomy    . Melanoma excision    . Cesarean section    . Breast biopsy Right 09/20/2012    Procedure: BREAST BIOPSY WITH NEEDLE LOCALIZATION;  Surgeon: Rolm Bookbinder, MD;  Location: [place];  Service: General;  Laterality: Right;    History reviewed. No pertinent family history.  Social History:  reports that she has never smoked. She does not have any smokeless tobacco history on file. She reports that she drinks alcohol. She reports that she does not use illicit drugs.  Allergies:  Allergies  Allergen Reactions  . Codeine Nausea And Vomiting    Medications:  Scheduled:  Continuous: . sodium chloride    . lactated ringers 1,000 mL (07/13/14 0941)    No results found for this or any previous visit (from the past 24 hour(s)).   No results found.  ROS:  As stated above in the HPI otherwise negative.  Blood pressure 144/79, pulse 67, temperature 98.2 F (36.8 C), temperature source Oral, resp. rate 13, height 5\' 5"  (1.651 m), weight 58.968 kg (130 lb), SpO2 99 %.    PE: Gen: NAD, Alert and Oriented HEENT:  [place]/AT, EOMI Neck:  Supple, no LAD Lungs: CTA Bilaterally CV: RRR without M/G/R ABM: Soft, NTND, +BS Ext: No C/C/E  Assessment/Plan: 1) Positive stool DNA test.  Plan: 1) Colonoscopy.  Zackarey Holleman D 07/13/2014, 9:57 AM

## 2014-07-13 NOTE — Transfer of Care (Signed)
Immediate Anesthesia Transfer of Care Note  Patient: Tonya Powers  Procedure(s) Performed: Procedure(s) (LRB): COLONOSCOPY WITH PROPOFOL (N/A)  Patient Location: PACU  Anesthesia Type: MAC  Level of Consciousness: sedated, patient cooperative and responds to stimulation  Airway & Oxygen Therapy: Patient Spontanous Breathing and Patient connected to face mask oxgen  Post-op Assessment: Report given to PACU RN and Post -op Vital signs reviewed and stable  Post vital signs: Reviewed and stable  Complications: No apparent anesthesia complications

## 2014-07-13 NOTE — Op Note (Signed)
[place], [id number]   COLONOSCOPY PROCEDURE REPORT  PATIENT: [name], [name]  MR#: [id number] BIRTHDATE: [birth year] , 72  yrs. old GENDER: female ENDOSCOPIST: Carol Ada, MD REFERRED BY: PROCEDURE DATE:  2014/07/21 PROCEDURE:   Colonoscopy with snare polypectomy ASA CLASS:   Class III INDICATIONS: Positive Stool DNA MEDICATIONS: Monitored anesthesia care  DESCRIPTION OF PROCEDURE:   After the risks and benefits and of the procedure were explained, informed consent was obtained.  revealed no abnormalities of the rectum.    The EC-[id number] ([id number]) endoscope was introduced through the anus and advanced to the cecum, which was identified by both the appendix and ileocecal valve .  The quality of the prep was excellent. .  The instrument was then slowly withdrawn as the colon was fully examined.     FINDINGS: A 4 mm sessile cecal polyp was removed with a cold snare. A 2-3 mm sessile transverse colon polyp was removed with a cold snare.  There was evidence of a mild melanosis coli.  No evidence of any masses, ulcerations, erosions, or vascular abnormalities. Retroflexed views revealed no abnormalities.     The scope was then withdrawn from the patient and the procedure completed.  WITHDRAWAL TIME: 15 minutes 0 seconds  COMPLICATIONS: There were no immediate complications. ENDOSCOPIC IMPRESSION: 1) Polyps. 2) Mild melanosis coli.  RECOMMENDATIONS: 1) Await biopsy results. 2) Repeat the colonoscopy in 5 years.  REPEAT EXAM:  cc:  _______________________________ eSignedCarol Ada, MD 21-Jul-2014 10:39 AM   CPT CODES: ICD CODES:  The ICD and CPT codes recommended by this software are interpretations from the data that the clinical staff has captured with the software.  The verification of the translation of this report to the ICD and CPT codes and modifiers is the sole responsibility of the health care institution and  practicing physician where this report was generated.  [facility]. will not be held responsible for the validity of the ICD and CPT codes included on this report.  AMA assumes no liability for data contained or not contained herein. CPT is a Designer, television/film set of the [employer].   PATIENT NAME:  Tonya Powers, Tonya Powers MR#: [id number]

## 2014-07-13 NOTE — Anesthesia Preprocedure Evaluation (Signed)
Anesthesia Evaluation  Patient identified by MRN, date of birth, ID band Patient awake    Reviewed: Allergy & Precautions, H&P , NPO status , Patient's Chart, lab work & pertinent test results, reviewed documented beta blocker date and time   Airway Mallampati: II  TM Distance: >3 FB Neck ROM: full    Dental no notable dental hx. (+) Teeth Intact, Dental Advisory Given   Pulmonary  breath sounds clear to auscultation  Pulmonary exam normal       Cardiovascular Rhythm:regular Rate:Normal     Neuro/Psych  Neuromuscular disease    GI/Hepatic   Endo/Other    Renal/GU      Musculoskeletal   Abdominal   Peds  Hematology   Anesthesia Other Findings MS  Reproductive/Obstetrics                             Anesthesia Physical  Anesthesia Plan  ASA: II  Anesthesia Plan: MAC   Post-op Pain Management:    Induction: Intravenous  Airway Management Planned: Mask and Natural Airway  Additional Equipment:   Intra-op Plan:   Post-operative Plan: Extubation in OR  Informed Consent: I have reviewed the patients History and Physical, chart, labs and discussed the procedure including the risks, benefits and alternatives for the proposed anesthesia with the patient or authorized representative who has indicated his/her understanding and acceptance.   Dental Advisory Given  Plan Discussed with: CRNA and Surgeon  Anesthesia Plan Comments: (Discussed sedation and potential to need to place airway or ETT if warranted by clinical changes intra-operatively. We will start procedure as MAC.)        Anesthesia Quick Evaluation

## 2014-07-16 ENCOUNTER — Encounter (HOSPITAL_COMMUNITY): Payer: Self-pay | Admitting: Gastroenterology

## 2014-07-24 ENCOUNTER — Ambulatory Visit (INDEPENDENT_AMBULATORY_CARE_PROVIDER_SITE_OTHER): Payer: Medicare Other | Admitting: Neurology

## 2014-07-24 ENCOUNTER — Ambulatory Visit (INDEPENDENT_AMBULATORY_CARE_PROVIDER_SITE_OTHER): Payer: Medicare Other | Admitting: *Deleted

## 2014-07-24 ENCOUNTER — Encounter: Payer: Self-pay | Admitting: Neurology

## 2014-07-24 VITALS — BP 106/58 | HR 64 | Resp 12 | Wt 130.0 lb

## 2014-07-24 DIAGNOSIS — G35 Multiple sclerosis: Secondary | ICD-10-CM

## 2014-07-24 DIAGNOSIS — F5104 Psychophysiologic insomnia: Secondary | ICD-10-CM

## 2014-07-24 DIAGNOSIS — G4700 Insomnia, unspecified: Secondary | ICD-10-CM

## 2014-07-24 DIAGNOSIS — R5382 Chronic fatigue, unspecified: Secondary | ICD-10-CM

## 2014-07-24 DIAGNOSIS — F418 Other specified anxiety disorders: Secondary | ICD-10-CM | POA: Diagnosis not present

## 2014-07-24 DIAGNOSIS — R3919 Other difficulties with micturition: Secondary | ICD-10-CM

## 2014-07-24 DIAGNOSIS — R260 Ataxic gait: Secondary | ICD-10-CM | POA: Diagnosis not present

## 2014-07-24 DIAGNOSIS — R39198 Other difficulties with micturition: Secondary | ICD-10-CM

## 2014-07-24 DIAGNOSIS — R531 Weakness: Secondary | ICD-10-CM | POA: Insufficient documentation

## 2014-07-24 MED ORDER — SODIUM CHLORIDE 0.9 % IV SOLN
1000.0000 mg | INTRAVENOUS | Status: DC
  Administered 2014-07-24: 1000 mg via INTRAVENOUS

## 2014-07-24 MED ORDER — AMPHETAMINE-DEXTROAMPHETAMINE 20 MG PO TABS: 20.0000 mg | ORAL_TABLET | Freq: Two times a day (BID) | ORAL | Status: DC

## 2014-07-24 MED ORDER — BACLOFEN 10 MG PO TABS: 10.0000 mg | ORAL_TABLET | Freq: Three times a day (TID) | ORAL | Status: DC

## 2014-07-24 MED ORDER — DIAZEPAM 5 MG PO TABS: 5.0000 mg | ORAL_TABLET | Freq: Four times a day (QID) | ORAL | Status: DC | PRN

## 2014-07-24 NOTE — Patient Instructions (Signed)
Pt tolerated infusion well.  Will call back prn/fim

## 2014-07-24 NOTE — Progress Notes (Signed)
[facility]  PATIENT: Tonya Powers DOB: 1960/07/11  REFERRING CLINICIAN: Mayra Neer is PCP HISTORY FROM: Patient  REASON FOR VISIT: MS and poor gait   HISTORICAL  CHIEF COMPLAINT:  Chief Complaint  Patient presents with  . Multiple Sclerosis    Sts. fatigue, gait/balance, lbp and bilat leg pain are worse.  Also sts. having more frequent "adderall h/a's"/fim    HISTORY OF PRESENT ILLNESS:  Tonya Powers is a 54 year old woman who presented with optic neuritis in 1991 followed shortly by difficulties with leg weakness and by right trigeminal neuralgia. In retrospect, couple years earlier when she had her daughter, has some difficulties with her legs and needed to go on short-term disability. At first she was not diagnosed with MS but after more of the symptoms she underwent MRI testing and had a lumbar puncture by Dr. Johnnye Sima. The imaging and the CSF was consistent with multiple sclerosis. When Betaseron became available she was placed on that. She was on Betaseron for about 10 years. She felt that she did not have too many exacerbations during that time but she had a lot of difficulty tolerating the Betaseron due to skin reactions. Around 12-15 years ago, she started to use a cane and she has had progressive gait disturbance over the last decade. Around the house for short distances, she uses a walker but uses her scooter for longer distances. Outside she uses a wheelchair pushed by others.  Her main problems with the MS include take weakness in her legs leading to a poor gait and pain in the large muscles of the leg. She also has a lot of fatigue and difficulties with bladder and bowels.  Leg issues began in 1989 when she had some difficulties with gait after delivering. Noted that there is stiffness in her legs since that time. The stiffness has progressively worsened over the last decade. Leg weakness and spasticity is fairly symmetric. Pain has escalated quite a  bit over the last 10 years and is located mostly around the hip and buttock regions and down her leg somewhat. When he do the makes pain worse pain increases when she stands and is generally best when she is bearing no weight.  She does take some benzodiazepines. She takes clonazepam 1 mg every night. This helps her leg spasms as well as helps her to fall asleep. She takes Valium only every day or 2 as needed when she has more pain. She takes Xanax 0.5 mg when she has more anxiety. She does not take all 3 medicines in 1 day. Nucynta 50 mg after lunch has helped the pain some. She does not take more because of its expense. She takes tramadol up to 3 times a day. She does not tolerate opiates because of nausea. She has been on gabapentin for many years and is currently on 800 mg 3 or 4 times a day with benefit.  She reports latter frequency and urgency but also has urinary hesitancy and needs to push on her bladder to empty better. She feels she never completely empties. She gets frequent urinary tract infections. Taking desmopressin has greatly helped her nocturia and that helps her sleep better. She has also had constipation for many years. She has tried Linzess for her constipation but finds it only helps her for a few days. Senokot helps him as well.  She does not report any major problems with her vision. She needs to wear reading glasses but does not note any significant difficulty  with acuity. She does not have color desaturation in either eye. She does not have eye pain.  She also has a lot of difficulties with fatigue. This is helped best with Solu-Medrol infusions intermittently.  She also takes Adderall 20 mg, once most days but can take up to 2 a day. She reports that her fatigue is more physical and cognitive.  She notes both depression and anxiety. Her depression is usually worse when she has more pain and she becomes irritable. He has been an issue off and on and is usually worse when there is  more stress going on. Her anxiety was especially bad around the holidays when there were out-of-town guests.  She notes difficulty with insomnia, both sleep onset and sleep maintenance. He used to be able to sleep well with just Ambien but now needs to take both Ambien and clonazepam before bedtime. She does not report any problems with a hangover the next day.   REVIEW OF SYSTEMS:  Constitutional: No fevers, chills, sweats, or change in appetite.  Has Fatigue Eyes: No visual changes, double vision, eye pain Ear, nose and throat: No hearing loss, ear pain, nasal congestion, sore throat Cardiovascular: No chest pain, palpitations Respiratory:  No shortness of breath at rest or with exertion.   No wheezes GastrointestinaI: Mild dysphagia.  Constipation.  No nausea, vomiting, diarrhea.   Genitourinary:  No dysuria but has urinary retention and frequency.  Desmopressin helps nocturia. Musculoskeletal:  No neck pain,   Has lower back pain and buttocks.  Hips ans other joints ok Integumentary: No rash, pruritus, skin lesions Neurological: as above Psychiatric: see above Endocrine: No palpitations, diaphoresis, change in appetite, change in weigh or increased thirst Hematologic/Lymphatic:  No anemia, purpura, petechiae. Allergic/Immunologic: No itchy/runny eyes, nasal congestion, recent allergic reactions, rashes  ALLERGIES: Allergies  Allergen Reactions  . Codeine Nausea And Vomiting    HOME MEDICATIONS: Outpatient Prescriptions Prior to Visit  Medication Sig Dispense Refill  . ALPRAZolam (XANAX) 0.5 MG tablet Take 0.5 mg by mouth daily as needed for anxiety.     Marland Kitchen amphetamine-dextroamphetamine (ADDERALL) 20 MG tablet Take 20 mg by mouth every morning.     . Cholecalciferol (VITAMIN D PO) Take 1 tablet by mouth every morning.    . clonazePAM (KLONOPIN) 1 MG tablet Take 1 mg by mouth at bedtime as needed for anxiety (sleep).    Marland Kitchen desmopressin (DDAVP) 0.1 MG tablet Take 0.1 mg by mouth at  bedtime as needed (bladder.).    Marland Kitchen diazepam (VALIUM) 5 MG tablet Take 5 mg by mouth every 6 (six) hours as needed for anxiety.    . gabapentin (NEURONTIN) 800 MG tablet Take 800 mg by mouth 4 (four) times daily.    . MethylPREDNISolone Sodium Succ (SOLU-MEDROL IJ) Inject 1 each as directed. Infusion every 6-8 weeks    . promethazine (PHENERGAN) 25 MG tablet Take 25 mg by mouth every 6 (six) hours as needed for nausea.    . tapentadol (NUCYNTA) 50 MG TABS Take 50 mg by mouth 2 (two) times daily as needed for moderate pain.     . traMADol (ULTRAM) 50 MG tablet Take 50 mg by mouth 3 (three) times daily as needed for moderate pain.     Marland Kitchen zolpidem (AMBIEN) 10 MG tablet Take 10 mg by mouth at bedtime as needed for sleep.    Marland Kitchen estradiol (VIVELLE-DOT) 0.05 MG/24HR Place 1 patch onto the skin once a week. Tuesday.    Marland Kitchen ibuprofen (ADVIL,MOTRIN) 200 MG  tablet Take 400 mg by mouth every 6 (six) hours as needed.    . meperidine (DEMEROL) 50 MG tablet Take 1 tablet (50 mg total) by mouth every 6 (six) hours as needed for pain. (Patient not taking: Reported on 06/25/2014) 10 tablet 0  . promethazine (PHENERGAN) 12.5 MG tablet Take 1 tablet (12.5 mg total) by mouth every 6 (six) hours as needed for nausea. (Patient not taking: Reported on 06/25/2014) 10 tablet 0   No facility-administered medications prior to visit.    PAST MEDICAL HISTORY: Past Medical History  Diagnosis Date  . Multiple sclerosis   . Skin cancer   . History of breast biopsy   . PONV (postoperative nausea and vomiting)   . Anxiety   . Depression   . Neuromuscular disorder     MS  . Gait instability     uses wheelchair or assistance  . H/O steroid therapy     IV infusion every 6 to 8 weeks- last 05-31-14 [facility]    PAST SURGICAL HISTORY: Past Surgical History  Procedure Laterality Date  . Leg surgery      sx as a child  . Tonsillectomy      as a child  . Partial hysterectomy    . Debridement skin / sq / muscle  of arm Right 07/13/2003    I & D; debridement of tissue within triceps muscle  . Abdominal hysterectomy    . Melanoma excision    . Cesarean section    . Breast biopsy Right 09/20/2012    Procedure: BREAST BIOPSY WITH NEEDLE LOCALIZATION;  Surgeon: Rolm Bookbinder, MD;  Location: [place];  Service: General;  Laterality: Right;  . Colonoscopy with propofol N/A 07/13/2014    Procedure: COLONOSCOPY WITH PROPOFOL;  Surgeon: Beryle Beams, MD;  Location: [facility];  Service: Endoscopy;  Laterality: N/A;    FAMILY HISTORY: Family History  Problem Relation Age of Onset  . Hyperlipidemia Mother   . Hypertension Mother   . Hypertension Father   . Diabetes type II Father     SOCIAL HISTORY:  History   Social History  . Marital Status: Married    Spouse Name: N/A    Number of Children: N/A  . Years of Education: N/A   Occupational History  . Not on file.   Social History Main Topics  . Smoking status: Never Smoker   . Smokeless tobacco: Not on file  . Alcohol Use: Yes     Comment: social  . Drug Use: No  . Sexual Activity: Not on file   Other Topics Concern  . Not on file   Social History Narrative     PHYSICAL EXAM  Filed Vitals:   07/24/14 1542  BP: 106/58  Pulse: 64  Resp: 12  Weight: 130 lb (58.968 kg)    Body mass index is 21.63 kg/(m^2).   General: The patient is well-developed and well-nourished and in no acute distress  Eyes:  Funduscopic exam shows normal optic discs and retinal vessels.  Neck: The neck is supple, no carotid bruits are noted.  The neck is nontender.  Respiratory: The respiratory examination is clear.  Cardiovascular: The cardiovascular examination reveals a regular rate and rhythm, no murmurs, gallops or rubs are noted.  Skin: Extremities are without significant edema.  Neurologic Exam  Mental status: The patient is alert and oriented x 3 at the time of the examination. The patient has apparent normal  recent and remote memory, with an  apparently normal attention span and concentration ability.   Speech is normal.  Cranial nerves: Extraocular movements are full. Pupils are equal, round, and reactive to light and accomodation.  Visual fields are full.  Facial symmetry is present. There is good facial sensation to soft touch bilaterally.Facial strength is normal.  Trapezius and sternocleidomastoid strength is normal. No dysarthria is noted.  The tongue is midline, and the patient has symmetric elevation of the soft palate. No obvious hearing deficits are noted.  Motor:  Muscle bulk and tone are normal. Strength is  3 / 5 in proximal legs and 4/5 distally.   Sensory: Sensory testing is intact to pinprick, soft touch, vibration sensation, and position sense i arms but mild decreased touch in left leg.   .  Coordination: Cerebellar testing reveals reduced finger-nose-finger and poor heel-to-shin bilaterally.  Gait and station: She needs to use arms to stand.    Station is unsteady and gait requires support.  She can not tandem.  Romberg is negative.   Reflexes: Deep tendon reflexes are symmetric and increased in legs bilaterally. Plantar responses are normal.    DIAGNOSTIC DATA (LABS, IMAGING, TESTING) - I reviewed patient records, labs, notes, testing and imaging myself where available.  Lab Results  Component Value Date   WBC 6.7 09/14/2012   HGB 14.2 09/14/2012   HCT 39.9 09/14/2012   MCV 91.3 09/14/2012   PLT 229 09/14/2012      Component Value Date/Time   NA 139 09/14/2012 1500   K 3.4* 09/14/2012 1500   CL 100 09/14/2012 1500   CO2 30 09/14/2012 1500   GLUCOSE 107* 09/14/2012 1500   BUN 16 09/14/2012 1500   CREATININE 0.42* 09/14/2012 1500   CALCIUM 9.9 09/14/2012 1500   GFRNONAA >90 09/14/2012 1500   GFRAA >90 09/14/2012 1500      ASSESSMENT AND PLAN  Multiple sclerosis  Ataxic gait  Chronic fatigue  Urinary dysfunction  Chronic insomnia  Depression with  anxiety  In summary, Laquiesha Piacente is a 54 year old woman with multiple sclerosis who has had worsening fatigue and mood issues over the past few months. In the past, her fatigue has benefited some from a dose of IV Solu-Medrol and I will have her get 1 g of Solu-Medrol while she is here today. She is advised to stay active but to use the walker or her scooter while in the house for safety.    I will refill her medications and she will continue the stimulant for her fatigue and for decreased attention.  She will continue Nucynta for pain and knows not to take more than the prescribed dose. I'll also add baclofen to see if I can spasticity will improve. This may also help her insomnia at night.  She will return to see me in 4 months or sooner if she has new or worsening neurologic symptoms.   Richard A. Felecia Shelling, MD, PhD 9/39/0300, 9:23 PM Certified in Neurology, Clinical Neurophysiology, Sleep Medicine, Pain Medicine and Neuroimaging  [facility] [address] [place], [place] [address] [phone]

## 2014-07-25 ENCOUNTER — Ambulatory Visit: Payer: Self-pay | Admitting: Neurology

## 2014-08-02 ENCOUNTER — Ambulatory Visit: Payer: Self-pay | Admitting: Neurology

## 2014-08-16 ENCOUNTER — Inpatient Hospital Stay (HOSPITAL_COMMUNITY)
Admission: EM | Admit: 2014-08-16 | Discharge: 2014-08-18 | DRG: 392 | Disposition: A | Discharge status: Home / Self Care | Payer: Medicare Other | Attending: Internal Medicine | Admitting: Internal Medicine

## 2014-08-16 ENCOUNTER — Encounter (HOSPITAL_COMMUNITY): Payer: Self-pay | Admitting: Internal Medicine

## 2014-08-16 ENCOUNTER — Telehealth: Payer: Self-pay | Admitting: Neurology

## 2014-08-16 DIAGNOSIS — R32 Unspecified urinary incontinence: Secondary | ICD-10-CM | POA: Diagnosis present

## 2014-08-16 DIAGNOSIS — Z79899 Other long term (current) drug therapy: Secondary | ICD-10-CM | POA: Diagnosis not present

## 2014-08-16 DIAGNOSIS — G35 Multiple sclerosis: Secondary | ICD-10-CM | POA: Diagnosis present

## 2014-08-16 DIAGNOSIS — Z85820 Personal history of malignant melanoma of skin: Secondary | ICD-10-CM | POA: Diagnosis not present

## 2014-08-16 DIAGNOSIS — R197 Diarrhea, unspecified: Secondary | ICD-10-CM

## 2014-08-16 DIAGNOSIS — E860 Dehydration: Secondary | ICD-10-CM | POA: Diagnosis present

## 2014-08-16 DIAGNOSIS — E876 Hypokalemia: Secondary | ICD-10-CM

## 2014-08-16 DIAGNOSIS — Z993 Dependence on wheelchair: Secondary | ICD-10-CM

## 2014-08-16 DIAGNOSIS — R112 Nausea with vomiting, unspecified: Secondary | ICD-10-CM | POA: Diagnosis present

## 2014-08-16 DIAGNOSIS — Z833 Family history of diabetes mellitus: Secondary | ICD-10-CM

## 2014-08-16 DIAGNOSIS — A084 Viral intestinal infection, unspecified: Principal | ICD-10-CM | POA: Diagnosis present

## 2014-08-16 DIAGNOSIS — Z8249 Family history of ischemic heart disease and other diseases of the circulatory system: Secondary | ICD-10-CM | POA: Diagnosis not present

## 2014-08-16 DIAGNOSIS — K529 Noninfective gastroenteritis and colitis, unspecified: Secondary | ICD-10-CM

## 2014-08-16 DIAGNOSIS — R109 Unspecified abdominal pain: Secondary | ICD-10-CM

## 2014-08-16 LAB — BASIC METABOLIC PANEL
Anion gap: 8 (ref 5–15)
BUN: 19 mg/dL (ref 6–23)
CALCIUM: 8.7 mg/dL (ref 8.4–10.5)
CO2: 22 mmol/L (ref 19–32)
Chloride: 110 mmol/L (ref 96–112)
Creatinine, Ser: 0.42 mg/dL — ABNORMAL LOW (ref 0.50–1.10)
GFR calc Af Amer: >90 mL/min (ref >90)
GFR calc non Af Amer: >90 mL/min (ref >90)
GLUCOSE: 122 mg/dL — AB (ref 70–99)
POTASSIUM: 2.8 mmol/L — AB (ref 3.5–5.1)
SODIUM: 140 mmol/L (ref 135–145)

## 2014-08-16 LAB — CBC WITH DIFFERENTIAL/PLATELET
Basophils Absolute: 0 10*3/uL (ref 0.0–0.1)
Basophils Relative: 0 % (ref 0–1)
EOS ABS: 0 10*3/uL (ref 0.0–0.7)
EOS PCT: 0 % (ref 0–5)
HEMATOCRIT: 42.2 % (ref 36.0–46.0)
Hemoglobin: 14.1 g/dL (ref 12.0–15.0)
LYMPHS ABS: 0.3 10*3/uL — AB (ref 0.7–4.0)
LYMPHS PCT: 2 % — AB (ref 12–46)
MCH: 31.3 pg (ref 26.0–34.0)
MCHC: 33.4 g/dL (ref 30.0–36.0)
MCV: 93.6 fL (ref 78.0–100.0)
MONO ABS: 0.3 10*3/uL (ref 0.1–1.0)
MONOS PCT: 3 % (ref 3–12)
Neutro Abs: 10.5 10*3/uL — ABNORMAL HIGH (ref 1.7–7.7)
Neutrophils Relative %: 95 % — ABNORMAL HIGH (ref 43–77)
Platelets: 208 10*3/uL (ref 150–400)
RBC: 4.51 MIL/uL (ref 3.87–5.11)
RDW: 12.4 % (ref 11.5–15.5)
WBC: 11.1 10*3/uL — AB (ref 4.0–10.5)

## 2014-08-16 MED ORDER — SODIUM CHLORIDE 0.9 % IV BOLUS (SEPSIS)
1000.0000 mL | Freq: Once | INTRAVENOUS | Status: AC
  Administered 2014-08-16: 1000 mL via INTRAVENOUS

## 2014-08-16 MED ORDER — ONDANSETRON HCL 4 MG/2ML IJ SOLN
4.0000 mg | Freq: Once | INTRAMUSCULAR | Status: AC
  Administered 2014-08-16: 4 mg via INTRAVENOUS
  Filled 2014-08-16: qty 2

## 2014-08-16 MED ORDER — POTASSIUM CHLORIDE CRYS ER 20 MEQ PO TBCR
40.0000 meq | EXTENDED_RELEASE_TABLET | Freq: Once | ORAL | Status: AC
  Administered 2014-08-16: 40 meq via ORAL
  Filled 2014-08-16: qty 2

## 2014-08-16 MED ORDER — FENTANYL CITRATE 0.05 MG/ML IJ SOLN
50.0000 ug | Freq: Once | INTRAMUSCULAR | Status: AC
  Administered 2014-08-16: 50 ug via INTRAVENOUS
  Filled 2014-08-16: qty 2

## 2014-08-16 MED ORDER — PANTOPRAZOLE SODIUM 40 MG IV SOLR
40.0000 mg | Freq: Once | INTRAVENOUS | Status: AC
  Administered 2014-08-16: 40 mg via INTRAVENOUS
  Filled 2014-08-16: qty 40

## 2014-08-16 NOTE — ED Notes (Signed)
MD at bedside. 

## 2014-08-16 NOTE — ED Notes (Signed)
Pt tolerating fluids.   

## 2014-08-16 NOTE — ED Provider Notes (Addendum)
CSN: [id number]     Arrival date & time 08/16/14  1605 History   First MD Initiated Contact with Patient 08/16/14 1609     Chief Complaint  Patient presents with  . Nausea  . Diarrhea     (Consider location/radiation/quality/duration/timing/severity/associated sxs/prior Treatment) HPI.....multiple bouts of nausea and diarrhea since approximately 0500.  Patient has multiple sclerosis. She has been so weak today it has been difficult for her to stand and walk. Severity is moderate to severe. Nothing makes symptoms better or worse. Filed Vitals:   08/16/14 1605 08/16/14 1610 08/16/14 1847  BP:  130/77 142/77  Pulse:  106 101  Temp:  98.5 F (36.9 C) 98.9 F (37.2 C)  TempSrc:  Oral Oral  Resp:  18 20  SpO2: 94% 96% 95%    Past Medical History  Diagnosis Date  . Multiple sclerosis   . Skin cancer   . History of breast biopsy   . PONV (postoperative nausea and vomiting)   . Anxiety   . Depression   . Neuromuscular disorder     MS  . Gait instability     uses wheelchair or assistance  . H/O steroid therapy     IV infusion every 6 to 8 weeks- last 05-31-14 [facility]   Past Surgical History  Procedure Laterality Date  . Leg surgery      sx as a child  . Tonsillectomy      as a child  . Partial hysterectomy    . Debridement skin / sq / muscle of arm Right 07/13/2003    I & D; debridement of tissue within triceps muscle  . Abdominal hysterectomy    . Melanoma excision    . Cesarean section    . Breast biopsy Right 09/20/2012    Procedure: BREAST BIOPSY WITH NEEDLE LOCALIZATION;  Surgeon: Rolm Bookbinder, MD;  Location: [place];  Service: General;  Laterality: Right;  . Colonoscopy with propofol N/A 07/13/2014    Procedure: COLONOSCOPY WITH PROPOFOL;  Surgeon: Beryle Beams, MD;  Location: [facility];  Service: Endoscopy;  Laterality: N/A;   Family History  Problem Relation Age of Onset  . Hyperlipidemia Mother   . Hypertension Mother    . Hypertension Father   . Diabetes type II Father    History  Substance Use Topics  . Smoking status: Never Smoker   . Smokeless tobacco: Not on file  . Alcohol Use: Yes     Comment: social   OB History    No data available     Review of Systems  All other systems reviewed and are negative.     Allergies  Codeine  Home Medications   Prior to Admission medications   Medication Sig Start Date End Date Taking? Authorizing Provider  acetaminophen (TYLENOL) 500 MG tablet Take 1,000 mg by mouth every 6 (six) hours as needed for headache (headache).   Yes Historical Provider, MD  ALPRAZolam Duanne Moron) 0.5 MG tablet Take 0.5 mg by mouth daily as needed for anxiety (anxiety).  06/24/12  Yes Historical Provider, MD  amphetamine-dextroamphetamine (ADDERALL) 20 MG tablet Take 1 tablet (20 mg total) by mouth 2 (two) times daily. 07/24/14  Yes Richard A. Sater, MD  Cholecalciferol (VITAMIN D PO) Take 1 tablet by mouth every morning.   Yes Historical Provider, MD  clonazePAM (KLONOPIN) 1 MG tablet Take 1 mg by mouth at bedtime as needed for anxiety (sleep).   Yes Historical Provider, MD  desmopressin (DDAVP) 0.1 MG  tablet Take 0.1 mg by mouth at bedtime as needed (bladder.).   Yes Historical Provider, MD  diazepam (VALIUM) 5 MG tablet Take 1 tablet (5 mg total) by mouth every 6 (six) hours as needed for anxiety. 07/24/14  Yes Richard A. Sater, MD  gabapentin (NEURONTIN) 800 MG tablet Take 800 mg by mouth 4 (four) times daily.   Yes Historical Provider, MD  ibuprofen (ADVIL,MOTRIN) 200 MG tablet Take 400 mg by mouth every 6 (six) hours as needed for headache (headache).   Yes Historical Provider, MD  promethazine (PHENERGAN) 25 MG tablet Take 25 mg by mouth every 6 (six) hours as needed for nausea (nausea).    Yes Historical Provider, MD  tapentadol (NUCYNTA) 50 MG TABS Take 50 mg by mouth 2 (two) times daily as needed for moderate pain or severe pain (pain).    Yes Historical Provider, MD   traMADol (ULTRAM) 50 MG tablet Take 50 mg by mouth 3 (three) times daily as needed for moderate pain (pain).  08/01/12  Yes Historical Provider, MD  zolpidem (AMBIEN) 10 MG tablet Take 10 mg by mouth at bedtime as needed for sleep (sleep).    Yes Historical Provider, MD  baclofen (LIORESAL) 10 MG tablet Take 1 tablet (10 mg total) by mouth 3 (three) times daily. Patient not taking: Reported on 08/16/2014 07/24/14   Richard A. Sater, MD  estradiol (CLIMARA - DOSED IN MG/24 HR) 0.075 mg/24hr patch Place 0.075 mg onto the skin once a week. Put on Saturdays. 07/02/14   Historical Provider, MD   BP 142/77 mmHg  Pulse 101  Temp(Src) 98.9 F (37.2 C) (Oral)  Resp 20  SpO2 95% Physical Exam  Constitutional: She is oriented to person, place, and time.  Pale, dehydrated  HENT:  Head: Normocephalic and atraumatic.  Eyes: Conjunctivae and EOM are normal. Pupils are equal, round, and reactive to light.  Neck: Normal range of motion. Neck supple.  Cardiovascular: Normal rate and regular rhythm.   Pulmonary/Chest: Effort normal and breath sounds normal.  Abdominal: Soft. Bowel sounds are normal.  Musculoskeletal: Normal range of motion.  Neurological: She is alert and oriented to person, place, and time.  Skin: Skin is warm and dry.  Psychiatric: She has a normal mood and affect. Her behavior is normal.  Nursing note and vitals reviewed.   ED Course  Procedures (including critical care time) Labs Review Labs Reviewed  BASIC METABOLIC PANEL - Abnormal; Notable for the following:    Potassium 2.8 (*)    Glucose, Bld 122 (*)    Creatinine, Ser 0.42 (*)    All other components within normal limits  CBC WITH DIFFERENTIAL/PLATELET - Abnormal; Notable for the following:    WBC 11.1 (*)    Neutrophils Relative % 95 (*)    Neutro Abs 10.5 (*)    Lymphocytes Relative 2 (*)    Lymphs Abs 0.3 (*)    All other components within normal limits    Imaging Review No results found.   EKG  Interpretation None      MDM   Final diagnoses:  Gastroenteritis  Hypokalemia  Multiple sclerosis    Patient has had several bouts of diarrhea in the emergency department. She is still weak after IV fluids. Hypokalemia noted. Admit.    Nat Christen, MD 08/16/14 3235  Nat Christen, MD 08/16/14 2241

## 2014-08-16 NOTE — ED Notes (Signed)
Bed: MK10 Expected date:  Expected time:  Means of arrival:  Comments: EMS- n/v/d, Hx of MS

## 2014-08-16 NOTE — H&P (Signed)
[facility] History and Physical  Tonya Powers PXT:[id number] DOB: 1961/01/12 DOA: 08/16/2014  Referring physician: ER physician. PCP: Mayra Neer, MD   Chief Complaint: Nausea vomiting and diarrhea.  HPI: Tonya Powers is a 54 y.o. female with history of multiple sclerosis who is largely wheelchair-bound presents to the ER because of persistent nausea and vomiting and diarrhea. Patient has been having these symptoms since morning today. Patient has had multiple bouts of vomiting with diarrhea. Denies any blood in the vomitus or diarrhea. Denies any recent travel sick contacts or eating any old food. In the ER patient was found to be hypokalemic and patient also had at least 3 episodes of diarrhea in the ER. Patient at this time is been admitted for dehydration and possible gastroenteritis. Patient also has crampy abdominal pain during diarrhea.   Review of Systems: As presented in the history of presenting illness, rest negative.  Past Medical History  Diagnosis Date  . Multiple sclerosis   . Skin cancer   . History of breast biopsy   . PONV (postoperative nausea and vomiting)   . Anxiety   . Depression   . Neuromuscular disorder     MS  . Gait instability     uses wheelchair or assistance  . H/O steroid therapy     IV infusion every 6 to 8 weeks- last 05-31-14 [facility]   Past Surgical History  Procedure Laterality Date  . Leg surgery      sx as a child  . Tonsillectomy      as a child  . Partial hysterectomy    . Debridement skin / sq / muscle of arm Right 07/13/2003    I & D; debridement of tissue within triceps muscle  . Abdominal hysterectomy    . Melanoma excision    . Cesarean section    . Breast biopsy Right 09/20/2012    Procedure: BREAST BIOPSY WITH NEEDLE LOCALIZATION;  Surgeon: Rolm Bookbinder, MD;  Location: [place];  Service: General;  Laterality: Right;  . Colonoscopy with propofol N/A 07/13/2014    Procedure:  COLONOSCOPY WITH PROPOFOL;  Surgeon: Beryle Beams, MD;  Location: [facility];  Service: Endoscopy;  Laterality: N/A;   Social History:  reports that she has never smoked. She does not have any smokeless tobacco history on file. She reports that she drinks alcohol. She reports that she does not use illicit drugs. Where does patient live home. Can patient participate in ADLs? No.  Allergies  Allergen Reactions  . Codeine Nausea And Vomiting    Family History:  Family History  Problem Relation Age of Onset  . Hyperlipidemia Mother   . Hypertension Mother   . Hypertension Father   . Diabetes type II Father       Prior to Admission medications   Medication Sig Start Date End Date Taking? Authorizing Provider  acetaminophen (TYLENOL) 500 MG tablet Take 1,000 mg by mouth every 6 (six) hours as needed for headache (headache).   Yes Historical Provider, MD  ALPRAZolam Duanne Moron) 0.5 MG tablet Take 0.5 mg by mouth daily as needed for anxiety (anxiety).  06/24/12  Yes Historical Provider, MD  amphetamine-dextroamphetamine (ADDERALL) 20 MG tablet Take 1 tablet (20 mg total) by mouth 2 (two) times daily. 07/24/14  Yes Richard A. Sater, MD  Cholecalciferol (VITAMIN D PO) Take 1 tablet by mouth every morning.   Yes Historical Provider, MD  clonazePAM (KLONOPIN) 1 MG tablet Take 1 mg by mouth at bedtime  as needed for anxiety (sleep).   Yes Historical Provider, MD  desmopressin (DDAVP) 0.1 MG tablet Take 0.1 mg by mouth at bedtime as needed (bladder.).   Yes Historical Provider, MD  diazepam (VALIUM) 5 MG tablet Take 1 tablet (5 mg total) by mouth every 6 (six) hours as needed for anxiety. 07/24/14  Yes Richard A. Sater, MD  gabapentin (NEURONTIN) 800 MG tablet Take 800 mg by mouth 4 (four) times daily.   Yes Historical Provider, MD  ibuprofen (ADVIL,MOTRIN) 200 MG tablet Take 400 mg by mouth every 6 (six) hours as needed for headache (headache).   Yes Historical Provider, MD  promethazine (PHENERGAN)  25 MG tablet Take 25 mg by mouth every 6 (six) hours as needed for nausea (nausea).    Yes Historical Provider, MD  tapentadol (NUCYNTA) 50 MG TABS Take 50 mg by mouth 2 (two) times daily as needed for moderate pain or severe pain (pain).    Yes Historical Provider, MD  traMADol (ULTRAM) 50 MG tablet Take 50 mg by mouth 3 (three) times daily as needed for moderate pain (pain).  08/01/12  Yes Historical Provider, MD  zolpidem (AMBIEN) 10 MG tablet Take 10 mg by mouth at bedtime as needed for sleep (sleep).    Yes Historical Provider, MD  baclofen (LIORESAL) 10 MG tablet Take 1 tablet (10 mg total) by mouth 3 (three) times daily. Patient not taking: Reported on 08/16/2014 07/24/14   Richard A. Sater, MD  estradiol (CLIMARA - DOSED IN MG/24 HR) 0.075 mg/24hr patch Place 0.075 mg onto the skin once a week. Put on Saturdays. 07/02/14   Historical Provider, MD    Physical Exam: Filed Vitals:   08/16/14 1605 08/16/14 1610 08/16/14 1847 08/16/14 2246  BP:  130/77 142/77 142/82  Pulse:  106 101 105  Temp:  98.5 F (36.9 C) 98.9 F (37.2 C)   TempSrc:  Oral Oral   Resp:  18 20 18   SpO2: 94% 96% 95% 95%     General:  Moderately built and nourished.  Eyes: Anicteric no pallor.  ENT: No discharge from the ears eyes nose and mouth.  Neck: No mass felt.  Cardiovascular: S1-S2 heard.  Respiratory: No rhonchi or crepitations.  Abdomen: Soft nontender bowel sounds present.  Skin: No rash.  Musculoskeletal: No edema.  Psychiatric: Appears normal.  Neurologic: Alert awake oriented to time place and person. Paraplegic.  Labs on Admission:  Basic Metabolic Panel:  Recent Labs Lab 08/16/14 1905  NA 140  K 2.8*  CL 110  CO2 22  GLUCOSE 122*  BUN 19  CREATININE 0.42*  CALCIUM 8.7   Liver Function Tests: No results for input(s): AST, ALT, ALKPHOS, BILITOT, PROT, ALBUMIN in the last 168 hours. No results for input(s): LIPASE, AMYLASE in the last 168 hours. No results for input(s):  AMMONIA in the last 168 hours. CBC:  Recent Labs Lab 08/16/14 1905  WBC 11.1*  NEUTROABS 10.5*  HGB 14.1  HCT 42.2  MCV 93.6  PLT 208   Cardiac Enzymes: No results for input(s): CKTOTAL, CKMB, CKMBINDEX, TROPONINI in the last 168 hours.  BNP (last 3 results) No results for input(s): BNP in the last 8760 hours.  ProBNP (last 3 results) No results for input(s): PROBNP in the last 8760 hours.  CBG: No results for input(s): GLUCAP in the last 168 hours.  Radiological Exams on Admission: No results found.  Assessment/Plan Active Problems:   Multiple sclerosis   Gastroenteritis   Nausea vomiting and diarrhea  Hypokalemia   1. Nausea vomiting and diarrhea - most likely from viral gastroenteritis. At this time we will check stool studies. Patient has been placed on clear liquid diet and gently hydrate. Check x-rays. 2. Hypokalemia from vomiting and diarrhea - replace and recheck. Check magnesium levels. 3. Multiple sclerosis - continue present medications. Patient is not on any specific multiple sclerosis medication at this time.   DVT Prophylaxis Lovenox.  Code Status: Full code.  Family Communication: Patient's daughter at the bedside.  Disposition Plan: Admit to inpatient.    Colum Colt N. [facility] Pager [phone].  If 7PM-7AM, please contact night-coverage [email or web address] Password [facility] 08/16/2014, 10:54 PM

## 2014-08-16 NOTE — ED Notes (Signed)
Per EMS, Pt has hx MS, N/V/D started this am around 5 o'clock. She currently c/o headache. Pt has taken phenergan for nausea which has not relieved nausea. Zofran 4 mg IV given at 1531

## 2014-08-16 NOTE — Telephone Encounter (Signed)
Pt's sister is calling stating pt has a virus and MS and she is really sick she can hardly manage her.  She would like to speak with the nurse.  She has diarrhea, throwing up and can't keep anything or her medicines down.  She can't stand up.  This all started about 5 am this morning.  Not sure if she should take her to the hospital or not.  Please call back as soon as possible.

## 2014-08-16 NOTE — Telephone Encounter (Signed)
Pt. with n/v/d, unable to keep fluids down despite taking antiemetic.  Onset 24 hrs. ago.  Sx. are not related to MS--sister will follow up with pt's pcp, possible the ED for fluid replacement/fim

## 2014-08-17 ENCOUNTER — Inpatient Hospital Stay (HOSPITAL_COMMUNITY): Payer: Medicare Other

## 2014-08-17 LAB — COMPREHENSIVE METABOLIC PANEL
ALT: 13 U/L (ref 0–35)
AST: 18 U/L (ref 0–37)
Albumin: 3.2 g/dL — ABNORMAL LOW (ref 3.5–5.2)
Alkaline Phosphatase: 44 U/L (ref 39–117)
Anion gap: 8 (ref 5–15)
BUN: 15 mg/dL (ref 6–23)
CALCIUM: 7.8 mg/dL — AB (ref 8.4–10.5)
CO2: 22 mmol/L (ref 19–32)
CREATININE: 0.56 mg/dL (ref 0.50–1.10)
Chloride: 114 mmol/L — ABNORMAL HIGH (ref 96–112)
GFR calc non Af Amer: >90 mL/min (ref >90)
GLUCOSE: 104 mg/dL — AB (ref 70–99)
Potassium: 3.4 mmol/L — ABNORMAL LOW (ref 3.5–5.1)
SODIUM: 144 mmol/L (ref 135–145)
Total Bilirubin: 0.6 mg/dL (ref 0.3–1.2)
Total Protein: 5.9 g/dL — ABNORMAL LOW (ref 6.0–8.3)

## 2014-08-17 LAB — CBC WITH DIFFERENTIAL/PLATELET
Basophils Absolute: 0 10*3/uL (ref 0.0–0.1)
Basophils Relative: 0 % (ref 0–1)
EOS PCT: 0 % (ref 0–5)
Eosinophils Absolute: 0 10*3/uL (ref 0.0–0.7)
HCT: 38 % (ref 36.0–46.0)
Hemoglobin: 12.5 g/dL (ref 12.0–15.0)
LYMPHS ABS: 0.6 10*3/uL — AB (ref 0.7–4.0)
Lymphocytes Relative: 8 % — ABNORMAL LOW (ref 12–46)
MCH: 31.1 pg (ref 26.0–34.0)
MCHC: 32.9 g/dL (ref 30.0–36.0)
MCV: 94.5 fL (ref 78.0–100.0)
MONOS PCT: 6 % (ref 3–12)
Monocytes Absolute: 0.4 10*3/uL (ref 0.1–1.0)
Neutro Abs: 6.3 10*3/uL (ref 1.7–7.7)
Neutrophils Relative %: 86 % — ABNORMAL HIGH (ref 43–77)
Platelets: 178 10*3/uL (ref 150–400)
RBC: 4.02 MIL/uL (ref 3.87–5.11)
RDW: 12.6 % (ref 11.5–15.5)
WBC: 7.3 10*3/uL (ref 4.0–10.5)

## 2014-08-17 LAB — MAGNESIUM: Magnesium: 1.7 mg/dL (ref 1.5–2.5)

## 2014-08-17 MED ORDER — TAPENTADOL HCL 50 MG PO TABS: 50.0000 mg | ORAL_TABLET | Freq: Two times a day (BID) | ORAL | Status: DC | PRN

## 2014-08-17 MED ORDER — ALPRAZOLAM 0.5 MG PO TABS
0.5000 mg | ORAL_TABLET | Freq: Every day | ORAL | Status: DC | PRN
  Administered 2014-08-17: 0.5 mg via ORAL
  Filled 2014-08-17: qty 1

## 2014-08-17 MED ORDER — POTASSIUM CHLORIDE IN NACL 20-0.9 MEQ/L-% IV SOLN
INTRAVENOUS | Status: AC
  Administered 2014-08-17 (×3): via INTRAVENOUS
  Filled 2014-08-17 (×3): qty 1000

## 2014-08-17 MED ORDER — PROMETHAZINE HCL 25 MG PO TABS
25.0000 mg | ORAL_TABLET | Freq: Four times a day (QID) | ORAL | Status: DC | PRN
  Administered 2014-08-17: 25 mg via ORAL
  Filled 2014-08-17: qty 1

## 2014-08-17 MED ORDER — ACETAMINOPHEN 325 MG PO TABS: 650.0000 mg | ORAL_TABLET | Freq: Four times a day (QID) | ORAL | Status: DC | PRN

## 2014-08-17 MED ORDER — ACETAMINOPHEN 650 MG RE SUPP: 650.0000 mg | Freq: Four times a day (QID) | RECTAL | Status: DC | PRN

## 2014-08-17 MED ORDER — DIAZEPAM 5 MG PO TABS: 5.0000 mg | ORAL_TABLET | Freq: Four times a day (QID) | ORAL | Status: DC | PRN

## 2014-08-17 MED ORDER — POTASSIUM CHLORIDE CRYS ER 20 MEQ PO TBCR
40.0000 meq | EXTENDED_RELEASE_TABLET | Freq: Once | ORAL | Status: AC
  Administered 2014-08-17: 40 meq via ORAL
  Filled 2014-08-17: qty 2

## 2014-08-17 MED ORDER — ONDANSETRON HCL 4 MG/2ML IJ SOLN
4.0000 mg | Freq: Four times a day (QID) | INTRAMUSCULAR | Status: DC | PRN
  Administered 2014-08-17: 4 mg via INTRAVENOUS
  Filled 2014-08-17: qty 2

## 2014-08-17 MED ORDER — ZOLPIDEM TARTRATE 5 MG PO TABS
5.0000 mg | ORAL_TABLET | Freq: Every evening | ORAL | Status: DC | PRN
  Administered 2014-08-17 (×2): 5 mg via ORAL
  Filled 2014-08-17 (×2): qty 1

## 2014-08-17 MED ORDER — GABAPENTIN 400 MG PO CAPS
800.0000 mg | ORAL_CAPSULE | Freq: Four times a day (QID) | ORAL | Status: DC
  Administered 2014-08-17 – 2014-08-18 (×5): 800 mg via ORAL
  Filled 2014-08-17 (×5): qty 2

## 2014-08-17 MED ORDER — POTASSIUM CHLORIDE 10 MEQ/100ML IV SOLN
INTRAVENOUS | Status: AC
  Filled 2014-08-17: qty 100

## 2014-08-17 MED ORDER — SODIUM CHLORIDE 0.9 % IJ SOLN: 3.0000 mL | Freq: Two times a day (BID) | INTRAMUSCULAR | Status: DC

## 2014-08-17 MED ORDER — CHLORHEXIDINE GLUCONATE 0.12 % MT SOLN
15.0000 mL | Freq: Two times a day (BID) | OROMUCOSAL | Status: DC
  Administered 2014-08-17: 15 mL via OROMUCOSAL
  Filled 2014-08-17: qty 15

## 2014-08-17 MED ORDER — ONDANSETRON HCL 4 MG PO TABS: 4.0000 mg | ORAL_TABLET | Freq: Four times a day (QID) | ORAL | Status: DC | PRN

## 2014-08-17 MED ORDER — CETYLPYRIDINIUM CHLORIDE 0.05 % MT LIQD
7.0000 mL | Freq: Two times a day (BID) | OROMUCOSAL | Status: DC
  Administered 2014-08-17: 7 mL via OROMUCOSAL

## 2014-08-17 MED ORDER — AMPHETAMINE-DEXTROAMPHETAMINE 10 MG PO TABS
20.0000 mg | ORAL_TABLET | Freq: Two times a day (BID) | ORAL | Status: DC
  Administered 2014-08-17 – 2014-08-18 (×2): 20 mg via ORAL
  Filled 2014-08-17 (×2): qty 2

## 2014-08-17 MED ORDER — DESMOPRESSIN ACETATE 0.1 MG PO TABS
0.1000 mg | ORAL_TABLET | Freq: Every evening | ORAL | Status: DC | PRN
  Filled 2014-08-17: qty 1

## 2014-08-17 MED ORDER — MAGNESIUM SULFATE IN D5W 10-5 MG/ML-% IV SOLN
1.0000 g | Freq: Once | INTRAVENOUS | Status: AC
  Administered 2014-08-17: 1 g via INTRAVENOUS
  Filled 2014-08-17: qty 100

## 2014-08-17 MED ORDER — TRAMADOL HCL 50 MG PO TABS
50.0000 mg | ORAL_TABLET | Freq: Three times a day (TID) | ORAL | Status: DC | PRN
  Administered 2014-08-17 – 2014-08-18 (×3): 50 mg via ORAL
  Filled 2014-08-17 (×3): qty 1

## 2014-08-17 MED ORDER — ENOXAPARIN SODIUM 40 MG/0.4ML ~~LOC~~ SOLN
40.0000 mg | SUBCUTANEOUS | Status: DC
  Administered 2014-08-17 – 2014-08-18 (×2): 40 mg via SUBCUTANEOUS
  Filled 2014-08-17 (×2): qty 0.4

## 2014-08-17 MED ORDER — POTASSIUM CHLORIDE 10 MEQ/100ML IV SOLN
10.0000 meq | INTRAVENOUS | Status: AC
  Administered 2014-08-17 (×4): 10 meq via INTRAVENOUS
  Filled 2014-08-17 (×3): qty 100

## 2014-08-17 MED ORDER — MORPHINE SULFATE 2 MG/ML IJ SOLN
1.0000 mg | INTRAMUSCULAR | Status: DC | PRN
  Administered 2014-08-17 (×2): 1 mg via INTRAVENOUS
  Filled 2014-08-17 (×2): qty 1

## 2014-08-17 MED ORDER — CLONAZEPAM 1 MG PO TABS
1.0000 mg | ORAL_TABLET | Freq: Every evening | ORAL | Status: DC | PRN
  Administered 2014-08-17 (×2): 1 mg via ORAL
  Filled 2014-08-17 (×2): qty 1

## 2014-08-17 NOTE — Progress Notes (Signed)
Utilization review completed.  

## 2014-08-17 NOTE — Progress Notes (Signed)
[facility] PROGRESS NOTE  Tonya Powers ENI:[id number] DOB: 23-Apr-1961 DOA: 08/16/2014 PCP: Mayra Neer, MD  Brief narrative 54 year old female with multiple sclerosis, mainly wheelchair bound with bowel and urinary incontinence admitted with acute gastroenteritis and severe hypokalemia.   Assessment/Plan: Acute viral gastroenteritis No further nausea and vomiting. Has not had diarrhea since admission. Pending stool studies. Continue IV hydration with potassium replenishment. Would advance diet to regular.  Hypokalemia Secondary to GI loss. Replenish with IV and by mouth KCl. replenish low magnesium.  Multiple sclerosis Follows with neurology as outpatient. Continue home medications.  PT prophylaxis: Subcutaneous Lovenox  Diet: Regular  Code Status: Full code Family Communication: None at bedside Disposition Plan: Home possibly tomorrow if continues to improve   Consultants:  None  Procedures:  None  Antibiotics:  None  HPI/Subjective: Patient seen and  examined. Denies nausea or vomiting at this time. Has not had bowel movement since midnight  Objective: Filed Vitals:   08/17/14 0903  BP: 109/91  Pulse: 91  Temp: 98.6 F (37 C)  Resp: 18    Intake/Output Summary (Last 24 hours) at 08/17/14 1403 Last data filed at 08/17/14 1402  Gross per 24 hour  Intake      0 ml  Output    151 ml  Net   -151 ml   Filed Weights   08/17/14 0012  Weight: 60.2 kg (132 lb 11.5 oz)    Exam:   General:  Middle aged thin built female in no acute distress  HEENT: No pallor, no icterus, dry oral mucosa, neck supple  Cardiovascular: Normal S1 and S2, no murmurs  Respiratory: Clear to auscultation bilaterally  Abdomen: Soft, Nondistended, nontender, bowel sounds present  Musculoskeletal: Warm, no edema  CNS: Alert and oriented  Data Reviewed: Basic Metabolic Panel:  Recent Labs Lab 08/16/14 1905 08/17/14 0540  NA 140 144  K 2.8* 3.4*  CL 110  114*  CO2 22 22  GLUCOSE 122* 104*  BUN 19 15  CREATININE 0.42* 0.56  CALCIUM 8.7 7.8*  MG  --  1.7   Liver Function Tests:  Recent Labs Lab 08/17/14 0540  AST 18  ALT 13  ALKPHOS 44  BILITOT 0.6  PROT 5.9*  ALBUMIN 3.2*   No results for input(s): LIPASE, AMYLASE in the last 168 hours. No results for input(s): AMMONIA in the last 168 hours. CBC:  Recent Labs Lab 08/16/14 1905 08/17/14 0540  WBC 11.1* 7.3  NEUTROABS 10.5* 6.3  HGB 14.1 12.5  HCT 42.2 38.0  MCV 93.6 94.5  PLT 208 178   Cardiac Enzymes: No results for input(s): CKTOTAL, CKMB, CKMBINDEX, TROPONINI in the last 168 hours. BNP (last 3 results) No results for input(s): BNP in the last 8760 hours.  ProBNP (last 3 results) No results for input(s): PROBNP in the last 8760 hours.  CBG: No results for input(s): GLUCAP in the last 168 hours.  No results found for this or any previous visit (from the past 240 hour(s)).   Studies: Dg Abd Acute W/chest  08/17/2014   CLINICAL DATA:  Abdominal pain and weakness  EXAM: ACUTE ABDOMEN SERIES (ABDOMEN 2 VIEW & CHEST 1 VIEW)  COMPARISON:  05/04/2013  FINDINGS: Gaseous distention of colon with multiple fluid levels. No convincing bowel obstruction or perforation. No concerning intra-abdominal mass effect or calcification.  Normal heart size and mediastinal contours. No acute infiltrate or edema. No effusion or pneumothorax. No acute osseous findings.  IMPRESSION: Prominent colonic fluid and distention, question colitis/diarrheal illness. No  convincing bowel obstruction.   Electronically Signed   By: Monte Fantasia M.D.   On: 08/17/2014 10:19    Scheduled Meds: . amphetamine-dextroamphetamine  20 mg Oral BID WC  . antiseptic oral rinse  7 mL Mouth Rinse q12n4p  . enoxaparin (LOVENOX) injection  40 mg Subcutaneous Q24H  . gabapentin  800 mg Oral QID  . sodium chloride  3 mL Intravenous Q12H   Continuous Infusions: . 0.9 % NaCl with KCl 20 mEq / L 100 mL/hr at  08/17/14 1237      Time spent: 25 minutes    Kadeisha Betsch, [place]  [facility] Pager [phone]. If 7PM-7AM, please contact night-coverage at [email or web address], password [facility] 08/17/2014, 2:03 PM  LOS: 1 day

## 2014-08-18 LAB — BASIC METABOLIC PANEL
ANION GAP: 4 — AB (ref 5–15)
BUN: 7 mg/dL (ref 6–23)
CHLORIDE: 105 mmol/L (ref 96–112)
CO2: 28 mmol/L (ref 19–32)
Calcium: 8.1 mg/dL — ABNORMAL LOW (ref 8.4–10.5)
Creatinine, Ser: 0.43 mg/dL — ABNORMAL LOW (ref 0.50–1.10)
GFR calc Af Amer: >90 mL/min (ref >90)
Glucose, Bld: 91 mg/dL (ref 70–99)
Potassium: 4 mmol/L (ref 3.5–5.1)
Sodium: 137 mmol/L (ref 135–145)

## 2014-08-18 LAB — CLOSTRIDIUM DIFFICILE BY PCR: Toxigenic C. Difficile by PCR: NEGATIVE

## 2014-08-18 MED ORDER — BACLOFEN 10 MG PO TABS
10.0000 mg | ORAL_TABLET | Freq: Three times a day (TID) | ORAL | Status: DC
  Administered 2014-08-18: 10 mg via ORAL
  Filled 2014-08-18: qty 1

## 2014-08-18 NOTE — Discharge Summary (Signed)
Discharge Summary  Tonya Powers MR#: [id number]  DOB:Apr 04, 1961  Date of Admission: 08/16/2014 Date of Discharge: 08/18/2014  Patient's PCP: Mayra Neer, MD  Attending Physician:Earnestine Tuohey A  Consults: None  Discharge Diagnoses: Active Problems:   Multiple sclerosis   Gastroenteritis   Nausea vomiting and diarrhea   Hypokalemia   Brief Admitting History and Physical On admission: "Tonya Powers is a 54 y.o. female with history of multiple sclerosis who is largely wheelchair-bound presents to the ER because of persistent nausea and vomiting and diarrhea. Patient has been having these symptoms since morning today. Patient has had multiple bouts of vomiting with diarrhea. Denies any blood in the vomitus or diarrhea. Denies any recent travel sick contacts or eating any old food. In the ER patient was found to be hypokalemic and patient also had at least 3 episodes of diarrhea in the ER. Patient at this time is been admitted for dehydration and possible gastroenteritis. Patient also has crampy abdominal pain during diarrhea."  Discharge Medications   Medication List    TAKE these medications        acetaminophen 500 MG tablet  Commonly known as:  TYLENOL  Take 1,000 mg by mouth every 6 (six) hours as needed for headache (headache).     ALPRAZolam 0.5 MG tablet  Commonly known as:  XANAX  Take 0.5 mg by mouth daily as needed for anxiety (anxiety).     amphetamine-dextroamphetamine 20 MG tablet  Commonly known as:  ADDERALL  Take 1 tablet (20 mg total) by mouth 2 (two) times daily.     baclofen 10 MG tablet  Commonly known as:  LIORESAL  Take 1 tablet (10 mg total) by mouth 3 (three) times daily.     clonazePAM 1 MG tablet  Commonly known as:  KLONOPIN  Take 1 mg by mouth at bedtime as needed for anxiety (sleep).     desmopressin 0.1 MG tablet  Commonly known as:  DDAVP  Take 0.1 mg by mouth at bedtime as needed (bladder.).     diazepam 5 MG tablet  Commonly  known as:  VALIUM  Take 1 tablet (5 mg total) by mouth every 6 (six) hours as needed for anxiety.     estradiol 0.075 mg/24hr patch  Commonly known as:  CLIMARA - Dosed in mg/24 hr  Place 0.075 mg onto the skin once a week. Put on Saturdays.     gabapentin 800 MG tablet  Commonly known as:  NEURONTIN  Take 800 mg by mouth 4 (four) times daily.     ibuprofen 200 MG tablet  Commonly known as:  ADVIL,MOTRIN  Take 400 mg by mouth every 6 (six) hours as needed for headache (headache).     promethazine 25 MG tablet  Commonly known as:  PHENERGAN  Take 25 mg by mouth every 6 (six) hours as needed for nausea (nausea).     tapentadol 50 MG Tabs tablet  Commonly known as:  NUCYNTA  Take 50 mg by mouth 2 (two) times daily as needed for moderate pain or severe pain (pain).     traMADol 50 MG tablet  Commonly known as:  ULTRAM  Take 50 mg by mouth 3 (three) times daily as needed for moderate pain (pain).     VITAMIN D PO  Take 1 tablet by mouth every morning.     zolpidem 10 MG tablet  Commonly known as:  AMBIEN  Take 10 mg by mouth at bedtime as needed for sleep (sleep).  Hospital Course: <principal problem not specified> Present on Admission:  . Gastroenteritis . Nausea vomiting and diarrhea . Hypokalemia . Multiple sclerosis   Acute viral gastroenteritis No further nausea and vomiting.  Diarrhea improved during the hospital stay, no episodes of diarrhea for 24 hours prior to discharge.  C. difficile PCR and stool culture pending at the time of discharge, low likelihood for infectious etiology, given improvement in symptoms.  Abdominal x-ray showed prominent colonic fluid and distention, question colitis/diarrheal illness.  Do not suspect patient have colitis at this time given improvement in symptoms.  Likely due to a viral gastritis process.  Patient received supportive management with IV hydration with potassium replenishment.  Diet advanced to regular diet prior to  discharge, which she tolerated well.  Hypokalemia Resolved with replacement.  Multiple sclerosis Follows with neurology as outpatient. Continue home medications.  Family Communication: Daughter at bedside.  Consultants:  None  Procedures:  None  Antibiotics:  None  Day of Discharge BP 117/68 mmHg  Pulse 74  Temp(Src) 98.2 F (36.8 C) (Oral)  Resp 24  Ht _0  (1.676 m)  Wt 60.2 kg (132 lb 11.5 oz)  BMI 21.43 kg/m2  SpO2 98%   Physical Exam: General: Awake, Oriented, No acute distress. HEENT: EOMI. Neck: Supple CV: S1 and S2 Lungs: Clear to ascultation bilaterally Abdomen: Soft, Nontender, Nondistended, +bowel sounds. Ext: Good pulses. Trace edema.  Results for orders placed or performed during the hospital encounter of 08/16/14 (from the past 48 hour(s))  Basic metabolic panel     Status: Abnormal   Collection Time: 08/16/14  7:05 PM  Result Value Ref Range   Sodium 140 135 - 145 mmol/L   Potassium 2.8 (L) 3.5 - 5.1 mmol/L   Chloride 110 96 - 112 mmol/L   CO2 22 19 - 32 mmol/L   Glucose, Bld 122 (H) 70 - 99 mg/dL   BUN 19 6 - 23 mg/dL   Creatinine, Ser 0.42 (L) 0.50 - 1.10 mg/dL   Calcium 8.7 8.4 - 10.5 mg/dL   GFR calc non Af Amer >90 >90 mL/min   GFR calc Af Amer >90 >90 mL/min    Comment: (NOTE) The eGFR has been calculated using the CKD EPI equation. This calculation has not been validated in all clinical situations. eGFR's persistently <90 mL/min signify possible Chronic Kidney Disease.    Anion gap 8 5 - 15  CBC with Differential/Platelet     Status: Abnormal   Collection Time: 08/16/14  7:05 PM  Result Value Ref Range   WBC 11.1 (H) 4.0 - 10.5 K/uL   RBC 4.51 3.87 - 5.11 MIL/uL   Hemoglobin 14.1 12.0 - 15.0 g/dL   HCT 42.2 36.0 - 46.0 %   MCV 93.6 78.0 - 100.0 fL   MCH 31.3 26.0 - 34.0 pg   MCHC 33.4 30.0 - 36.0 g/dL   RDW 12.4 11.5 - 15.5 %   Platelets 208 150 - 400 K/uL   Neutrophils Relative % 95 (H) 43 - 77 %   Neutro Abs 10.5 (H)  1.7 - 7.7 K/uL   Lymphocytes Relative 2 (L) 12 - 46 %   Lymphs Abs 0.3 (L) 0.7 - 4.0 K/uL   Monocytes Relative 3 3 - 12 %   Monocytes Absolute 0.3 0.1 - 1.0 K/uL   Eosinophils Relative 0 0 - 5 %   Eosinophils Absolute 0.0 0.0 - 0.7 K/uL   Basophils Relative 0 0 - 1 %   Basophils Absolute 0.0 0.0 -  0.1 K/uL  Comprehensive metabolic panel     Status: Abnormal   Collection Time: 08/17/14  5:40 AM  Result Value Ref Range   Sodium 144 135 - 145 mmol/L   Potassium 3.4 (L) 3.5 - 5.1 mmol/L    Comment: DELTA CHECK NOTED REPEATED TO VERIFY NO VISIBLE HEMOLYSIS    Chloride 114 (H) 96 - 112 mmol/L   CO2 22 19 - 32 mmol/L   Glucose, Bld 104 (H) 70 - 99 mg/dL   BUN 15 6 - 23 mg/dL   Creatinine, Ser 0.56 0.50 - 1.10 mg/dL   Calcium 7.8 (L) 8.4 - 10.5 mg/dL   Total Protein 5.9 (L) 6.0 - 8.3 g/dL   Albumin 3.2 (L) 3.5 - 5.2 g/dL   AST 18 0 - 37 U/L   ALT 13 0 - 35 U/L   Alkaline Phosphatase 44 39 - 117 U/L   Total Bilirubin 0.6 0.3 - 1.2 mg/dL   GFR calc non Af Amer >90 >90 mL/min   GFR calc Af Amer >90 >90 mL/min    Comment: (NOTE) The eGFR has been calculated using the CKD EPI equation. This calculation has not been validated in all clinical situations. eGFR's persistently <90 mL/min signify possible Chronic Kidney Disease.    Anion gap 8 5 - 15  CBC with Differential/Platelet     Status: Abnormal   Collection Time: 08/17/14  5:40 AM  Result Value Ref Range   WBC 7.3 4.0 - 10.5 K/uL   RBC 4.02 3.87 - 5.11 MIL/uL   Hemoglobin 12.5 12.0 - 15.0 g/dL   HCT 38.0 36.0 - 46.0 %   MCV 94.5 78.0 - 100.0 fL   MCH 31.1 26.0 - 34.0 pg   MCHC 32.9 30.0 - 36.0 g/dL   RDW 12.6 11.5 - 15.5 %   Platelets 178 150 - 400 K/uL   Neutrophils Relative % 86 (H) 43 - 77 %   Neutro Abs 6.3 1.7 - 7.7 K/uL   Lymphocytes Relative 8 (L) 12 - 46 %   Lymphs Abs 0.6 (L) 0.7 - 4.0 K/uL   Monocytes Relative 6 3 - 12 %   Monocytes Absolute 0.4 0.1 - 1.0 K/uL   Eosinophils Relative 0 0 - 5 %   Eosinophils  Absolute 0.0 0.0 - 0.7 K/uL   Basophils Relative 0 0 - 1 %   Basophils Absolute 0.0 0.0 - 0.1 K/uL  Magnesium     Status: None   Collection Time: 08/17/14  5:40 AM  Result Value Ref Range   Magnesium 1.7 1.5 - 2.5 mg/dL  Basic metabolic panel     Status: Abnormal   Collection Time: 08/18/14  6:57 AM  Result Value Ref Range   Sodium 137 135 - 145 mmol/L    Comment: RESULT REPEATED AND VERIFIED DELTA CHECK NOTED    Potassium 4.0 3.5 - 5.1 mmol/L   Chloride 105 96 - 112 mmol/L    Comment: RESULT REPEATED AND VERIFIED DELTA CHECK NOTED    CO2 28 19 - 32 mmol/L   Glucose, Bld 91 70 - 99 mg/dL   BUN 7 6 - 23 mg/dL   Creatinine, Ser 0.43 (L) 0.50 - 1.10 mg/dL   Calcium 8.1 (L) 8.4 - 10.5 mg/dL   GFR calc non Af Amer >90 >90 mL/min   GFR calc Af Amer >90 >90 mL/min    Comment: (NOTE) The eGFR has been calculated using the CKD EPI equation. This calculation has not been validated in all  clinical situations. eGFR's persistently <90 mL/min signify possible Chronic Kidney Disease.    Anion gap 4 (L) 5 - 15    Dg Abd Acute W/chest  08/17/2014   CLINICAL DATA:  Abdominal pain and weakness  EXAM: ACUTE ABDOMEN SERIES (ABDOMEN 2 VIEW & CHEST 1 VIEW)  COMPARISON:  05/04/2013  FINDINGS: Gaseous distention of colon with multiple fluid levels. No convincing bowel obstruction or perforation. No concerning intra-abdominal mass effect or calcification.  Normal heart size and mediastinal contours. No acute infiltrate or edema. No effusion or pneumothorax. No acute osseous findings.  IMPRESSION: Prominent colonic fluid and distention, question colitis/diarrheal illness. No convincing bowel obstruction.   Electronically Signed   By: Monte Fantasia M.D.   On: 08/17/2014 10:19     Disposition: Home  Diet: Heart healthy diet  Activity: Resume as tolerated.   Follow-up Appts:     Discharge Instructions    Diet - low sodium heart healthy    Complete by:  As directed      Discharge instructions     Complete by:  As directed   Please followup with SHAW,KIMBERLEE, MD (PCP) in 1 week. If any worsening symptoms, please come back to the emergency department or to your primary care physician for further management.     Increase activity slowly    Complete by:  As directed            TESTS THAT NEED FOLLOW-UP Stool culture and C. difficile PCR.  Time spent on discharge, talking to the patient, and coordinating care: 25 mins.   Signed: Bynum Bellows, MD 08/18/2014, 12:19 PM

## 2014-08-21 LAB — STOOL CULTURE

## 2014-09-05 ENCOUNTER — Telehealth: Payer: Self-pay | Admitting: *Deleted

## 2014-09-05 NOTE — Telephone Encounter (Signed)
Patient form on [facility]  Desk.

## 2014-09-06 DIAGNOSIS — Z0289 Encounter for other administrative examinations: Secondary | ICD-10-CM

## 2014-09-11 ENCOUNTER — Other Ambulatory Visit: Payer: Self-pay | Admitting: *Deleted

## 2014-09-11 MED ORDER — CLONAZEPAM 1 MG PO TABS: 1.0000 mg | ORAL_TABLET | Freq: Every evening | ORAL | Status: DC | PRN

## 2014-09-11 MED ORDER — GABAPENTIN 800 MG PO TABS: 800.0000 mg | ORAL_TABLET | Freq: Four times a day (QID) | ORAL | Status: DC

## 2014-09-11 MED ORDER — TRAMADOL HCL 50 MG PO TABS
50.0000 mg | ORAL_TABLET | Freq: Three times a day (TID) | ORAL | Status: DC | PRN
Start: 2014-09-11 — End: 2015-01-22

## 2014-09-11 MED ORDER — AMPHETAMINE-DEXTROAMPHETAMINE 20 MG PO TABS: 20.0000 mg | ORAL_TABLET | Freq: Two times a day (BID) | ORAL | Status: DC

## 2014-09-11 NOTE — Telephone Encounter (Signed)
Rx's given to pt. while she was here for infusion/fim

## 2014-10-11 ENCOUNTER — Telehealth: Payer: Self-pay | Admitting: Neurology

## 2014-10-11 MED ORDER — TAPENTADOL HCL 50 MG PO TABS: 50.0000 mg | ORAL_TABLET | Freq: Three times a day (TID) | ORAL | Status: DC | PRN

## 2014-10-11 NOTE — Telephone Encounter (Signed)
Rx. last filled in Jan.   It  has been printed, signed, and is up front [facility].  I have spoken with [name] and let her know rx. is available for pick up/fim

## 2014-10-11 NOTE — Telephone Encounter (Signed)
Patient requesting refill for Rx tapentadol (NUCYNTA) 50 MG TABS.  Please call when ready for pick up.

## 2014-10-22 ENCOUNTER — Telehealth: Payer: Self-pay | Admitting: Neurology

## 2014-10-22 MED ORDER — ZOLPIDEM TARTRATE 10 MG PO TABS: 10.0000 mg | ORAL_TABLET | Freq: Every evening | ORAL | Status: DC | PRN

## 2014-10-22 NOTE — Telephone Encounter (Signed)
Patient is calling for a written Rx Zolpidem 10 mg.  Please call for pickup.

## 2014-10-22 NOTE — Telephone Encounter (Signed)
Spoke with Summar and advised rx. for Ambien has been [facility]ed to [facility]/fim

## 2014-10-25 NOTE — Telephone Encounter (Signed)
Patient called stating that [facility] [address] has not received the fax for the Zolpidem 10mg . She would like for the order to be [facility] to [facility]. Pt's c/b [phone]

## 2014-10-25 NOTE — Telephone Encounter (Signed)
LMOM for Tanvi that I have refaxed rx. to [facility] [address]/fim

## 2014-11-23 ENCOUNTER — Telehealth: Payer: Self-pay | Admitting: Neurology

## 2014-11-23 MED ORDER — AMPHETAMINE-DEXTROAMPHETAMINE 20 MG PO TABS: 20.0000 mg | ORAL_TABLET | Freq: Two times a day (BID) | ORAL | Status: DC

## 2014-11-23 NOTE — Telephone Encounter (Signed)
Pt called and requested a refill on Rx. amphetamine-dextroamphetamine (ADDERALL) 20 MG tablet. Informed the pt it would be ready in 24 hours unless told otherwise by the nurse, and informed her we were not open on Monday.

## 2014-11-23 NOTE — Telephone Encounter (Signed)
Rx. printed, signed, up front [facility].  I have spoken with Tonya Powers this morning and let her know rx. is ready if she needs it today/fim

## 2015-01-22 ENCOUNTER — Other Ambulatory Visit: Payer: Self-pay | Admitting: Neurology

## 2015-01-22 MED ORDER — TRAMADOL HCL 50 MG PO TABS: 50.0000 mg | ORAL_TABLET | Freq: Three times a day (TID) | ORAL | Status: DC | PRN

## 2015-01-22 MED ORDER — AMPHETAMINE-DEXTROAMPHETAMINE 20 MG PO TABS: 20.0000 mg | ORAL_TABLET | Freq: Two times a day (BID) | ORAL | Status: DC

## 2015-01-22 MED ORDER — CLONAZEPAM 1 MG PO TABS: 1.0000 mg | ORAL_TABLET | Freq: Every evening | ORAL | Status: DC | PRN

## 2015-01-22 NOTE — Telephone Encounter (Signed)
Requests entered, forwarded to provide for review.

## 2015-01-22 NOTE — Telephone Encounter (Signed)
Patient called requesting refill for amphetamine-dextroamphetamine (ADDERALL) 20 MG tablet and clonazePAM (KLONOPIN) 1 MG tablet and traMADol (ULTRAM) 50 MG tablet . Patient advised RX will be ready within 24 hours unless otherwise informed by RN. Patient can be reached at [phone].

## 2015-01-23 ENCOUNTER — Encounter: Payer: Self-pay | Admitting: *Deleted

## 2015-01-23 NOTE — Progress Notes (Signed)
Adderall, Clonazepam and Tramadol rx's up front [facility]/fim

## 2015-01-23 NOTE — Progress Notes (Unsigned)
Subjective:    Patient ID: Tonya Powers is a 54 y.o. female.  HPI {Common ambulatory SmartLinks:[id number]}  Review of Systems  Objective:  Neurologic Exam  Physical Exam  Assessment:   ***  Plan:   ***

## 2015-02-18 ENCOUNTER — Telehealth: Payer: Self-pay | Admitting: Neurology

## 2015-02-18 NOTE — Telephone Encounter (Signed)
Message given to Otila Kluver to call Elen to schedule SM infusion./fim

## 2015-02-18 NOTE — Telephone Encounter (Signed)
Patient is calling to schedule a tysabri infusion.  Please call.  Thanks!

## 2015-02-19 ENCOUNTER — Other Ambulatory Visit: Payer: Self-pay | Admitting: *Deleted

## 2015-02-19 MED ORDER — TAPENTADOL HCL 50 MG PO TABS: 50.0000 mg | ORAL_TABLET | Freq: Three times a day (TID) | ORAL | Status: DC | PRN

## 2015-02-19 MED ORDER — DIAZEPAM 5 MG PO TABS: 5.0000 mg | ORAL_TABLET | Freq: Four times a day (QID) | ORAL | Status: DC | PRN

## 2015-02-19 MED ORDER — AMPHETAMINE-DEXTROAMPHETAMINE 20 MG PO TABS: 20.0000 mg | ORAL_TABLET | Freq: Two times a day (BID) | ORAL | Status: DC

## 2015-02-19 NOTE — Telephone Encounter (Signed)
Pt. requested refills while in the office for SoluMedrol infusion, but had to leave before rx's were ready.  Rx.'s printed, signed, up front [facility]/fim

## 2015-03-12 ENCOUNTER — Ambulatory Visit (INDEPENDENT_AMBULATORY_CARE_PROVIDER_SITE_OTHER): Payer: Medicare Other | Admitting: Neurology

## 2015-03-12 ENCOUNTER — Encounter: Payer: Self-pay | Admitting: Neurology

## 2015-03-12 VITALS — BP 118/80 | HR 74 | Resp 14 | Ht 66.0 in | Wt 130.0 lb

## 2015-03-12 DIAGNOSIS — F5104 Psychophysiologic insomnia: Secondary | ICD-10-CM

## 2015-03-12 DIAGNOSIS — G4700 Insomnia, unspecified: Secondary | ICD-10-CM

## 2015-03-12 DIAGNOSIS — R39198 Other difficulties with micturition: Secondary | ICD-10-CM

## 2015-03-12 DIAGNOSIS — R3919 Other difficulties with micturition: Secondary | ICD-10-CM | POA: Diagnosis not present

## 2015-03-12 DIAGNOSIS — G35 Multiple sclerosis: Secondary | ICD-10-CM | POA: Diagnosis not present

## 2015-03-12 DIAGNOSIS — R5382 Chronic fatigue, unspecified: Secondary | ICD-10-CM

## 2015-03-12 DIAGNOSIS — F418 Other specified anxiety disorders: Secondary | ICD-10-CM

## 2015-03-12 DIAGNOSIS — R260 Ataxic gait: Secondary | ICD-10-CM | POA: Diagnosis not present

## 2015-03-12 MED ORDER — LAMOTRIGINE 100 MG PO TABS: 100.0000 mg | ORAL_TABLET | Freq: Every day | ORAL | Status: DC

## 2015-03-12 MED ORDER — LAMOTRIGINE 25 MG PO TABS
ORAL_TABLET | ORAL | Status: DC
Start: 2015-03-12 — End: 2015-07-09

## 2015-03-12 MED ORDER — TRAMADOL HCL 50 MG PO TABS: 50.0000 mg | ORAL_TABLET | Freq: Three times a day (TID) | ORAL | Status: DC | PRN

## 2015-03-12 MED ORDER — CLONAZEPAM 1 MG PO TABS
1.0000 mg | ORAL_TABLET | Freq: Every evening | ORAL | Status: DC | PRN
Start: 2015-03-12 — End: 2015-09-17

## 2015-03-12 NOTE — Patient Instructions (Signed)
The pharmacy has the prescription of lamotrigine 25 mg. Please take it as follows: For 1 week take 1 pill once a day. The second week, take 1 pill twice a day. The third week, take 1 pill in the morning and 2 at bedtime or evening. The fourth week take 2 pills twice a day.  I have also give you a paper prescription for lamotrigine 100 mg tablets after the first 4 weeks you should take 100 mg twice a day.  Most people tolerate lamotrigine very well. Some will get a rash. If you do get a significant rash stop the medicine immediately and let us know. 

## 2015-03-12 NOTE — Progress Notes (Signed)
[facility]  PATIENT: Tonya Powers DOB: February 02, 1961  REFERRING CLINICIAN: Mayra Powers is PCP HISTORY FROM: Patient  REASON FOR VISIT: MS and poor gait   HISTORICAL  CHIEF COMPLAINT:  Chief Complaint  Patient presents with  . Multiple Sclerosis    She is not on any MS therapy--just has occasional SoluMedrol infusions for fatigue, right sided lbp, buttock pain.  Sts. she also has increased sensitivity around bra line.  She is under more stress planning dtr Sarah's wedding./fim  . Back Pain    Sts. Nucynta ER does not help pain, but ins. will not approve regular Nucynta.  She need a letter of med. nec. for Nucynta.  She needs r/f of Clonazepam and Tramadol/fim    HISTORY OF PRESENT ILLNESS:  Tonya Powers is a 54 year old woman with MS.   She feels her MS is doing ok but she has had a lot more stress with her daughter's wedding in a couple weeks.    Over the past year, she has had more difficulty with her gait and is much more sensitive to touch feeling like pain.   Her clothes feel like sandpaper from the bra level on down to her toes.   She felt Tysabri helped her the most but she still progressed while on it. Tecfidera was poorly tolerated and she stopped.     Gait/strength/sensation:    Gait is poor and she uses a walker or an Transport planner in the house and two walking sticks / canes when outside short distances.      She has numbness from the lower chest down and in her legs.  More recently mild tingling has become a much more painful allodynia.     Bladder/bowel:  She reports severe constipation. Linzess did not help.    Nucynta does not worsen it.    Bladder is poor with both hesitancy and urgency.    Taking desmopressin has greatly helped her nocturia and that helps her sleep better. She has also had constipation for many years.   Vision:  She denies MS related eye issues.  Fatigue/sleep:   Fatigue is also a problem.  This is helped best with Solu-Medrol  infusions intermittently.  She also takes Adderall 20 mg, once most days but can take up to 2 a day. She reports that her fatigue is more physical and cognitive.. She takes clonazepam 1 mg and Ambien every night due to difficulty falling and staying asleep. Clonazepam also helps her leg spasms as well as helps her to fall asleep.  Leg pain:   Pain is around the hip and buttock regions and down her leg somewhat.  Spasticity is painful and she has dysesthesias.    Nucynta helps as much as opiate and is better tolerated (takes instead of tramadol at lunch).  She takes gabapentin 800 mg 3-4 a day.       Mood/cognition:   She notes both depression and anxiety. Her depression is usually worse when she has more pain   She is sometimes irritable.  She rarely takes Xanax 0.5 mg when she has more anxiety. She notes more trouble with cognition, especially short term memory.   MS History:  She presented with optic neuritis in 1991 followed shortly by difficulties with leg weakness and by right trigeminal neuralgia. In retrospect, couple years earlier when she had her daughter, has some difficulties with her legs and needed to go on short-term disability. At first she was not diagnosed with MS but  after more of the symptoms she underwent MRI testing and had a lumbar puncture by Dr. Johnnye Sima. The imaging and the CSF was consistent with multiple sclerosis. When Betaseron became available she was placed on that. She was on Betaseron for about 10 years. She felt that she did not have too many exacerbations during that time but she had a lot of difficulty tolerating the Betaseron due to skin reactions. Around 12-15 years ago, she started to use a cane and she has had progressive gait disturbance over the last decade. Around the house for short distances, she uses a walker but uses her scooter for longer distances. Outside she uses a wheelchair pushed by others   REVIEW OF SYSTEMS:  Constitutional: No fevers, chills, sweats,  or change in appetite.  She has Fatigue Eyes: No visual changes, double vision, eye pain Ear, nose and throat: No hearing loss, ear pain, nasal congestion, sore throat Cardiovascular: No chest pain, palpitations Respiratory:  No shortness of breath at rest or with exertion.   No wheezes GastrointestinaI: Mild dysphagia.  Constipation.  No nausea, vomiting, diarrhea.   Genitourinary:  No dysuria but has urinary retention and frequency.  Desmopressin helps nocturia. Musculoskeletal:  No neck pain,   Has lower back pain and buttocks.  Hips ans other joints ok Integumentary: No rash, pruritus, skin lesions Neurological: as above Psychiatric: see above Endocrine: No palpitations, diaphoresis, change in appetite, change in weigh or increased thirst Hematologic/Lymphatic:  No anemia, purpura, petechiae. Allergic/Immunologic: No itchy/runny eyes, nasal congestion, recent allergic reactions, rashes  ALLERGIES: Allergies  Allergen Reactions  . Codeine Nausea And Vomiting    HOME MEDICATIONS: Outpatient Prescriptions Prior to Visit  Medication Sig Dispense Refill  . acetaminophen (TYLENOL) 500 MG tablet Take 1,000 mg by mouth every 6 (six) hours as needed for headache (headache).    . ALPRAZolam (XANAX) 0.5 MG tablet Take 0.5 mg by mouth daily as needed for anxiety (anxiety).     Marland Kitchen amphetamine-dextroamphetamine (ADDERALL) 20 MG tablet Take 1 tablet (20 mg total) by mouth 2 (two) times daily. 60 tablet 0  . Cholecalciferol (VITAMIN D PO) Take 1 tablet by mouth every morning.    . clonazePAM (KLONOPIN) 1 MG tablet Take 1 tablet (1 mg total) by mouth at bedtime as needed for anxiety (sleep). 30 tablet 0  . desmopressin (DDAVP) 0.1 MG tablet Take 0.1 mg by mouth at bedtime as needed (bladder.).    Marland Kitchen diazepam (VALIUM) 5 MG tablet Take 1 tablet (5 mg total) by mouth every 6 (six) hours as needed for anxiety. 90 tablet 3  . estradiol (CLIMARA - DOSED IN MG/24 HR) 0.075 mg/24hr patch Place 0.075 mg onto  the skin once a week. Put on Saturdays.  10  . gabapentin (NEURONTIN) 800 MG tablet Take 1 tablet (800 mg total) by mouth 4 (four) times daily. 120 tablet 11  . ibuprofen (ADVIL,MOTRIN) 200 MG tablet Take 400 mg by mouth every 6 (six) hours as needed for headache (headache).    . promethazine (PHENERGAN) 25 MG tablet Take 25 mg by mouth every 6 (six) hours as needed for nausea (nausea).     . tapentadol (NUCYNTA) 50 MG TABS tablet Take 1 tablet (50 mg total) by mouth 3 (three) times daily as needed for moderate pain or severe pain (pain). 90 tablet 0  . traMADol (ULTRAM) 50 MG tablet Take 1 tablet (50 mg total) by mouth 3 (three) times daily as needed for moderate pain (pain). 90 tablet 0  .  zolpidem (AMBIEN) 10 MG tablet Take 1 tablet (10 mg total) by mouth at bedtime as needed for sleep (sleep). 30 tablet 5  . baclofen (LIORESAL) 10 MG tablet Take 1 tablet (10 mg total) by mouth 3 (three) times daily. (Patient not taking: Reported on 08/16/2014) 905 each 5   No facility-administered medications prior to visit.    PAST MEDICAL HISTORY: Past Medical History  Diagnosis Date  . Multiple sclerosis   . Skin cancer   . History of breast biopsy   . PONV (postoperative nausea and vomiting)   . Anxiety   . Depression   . Neuromuscular disorder     MS  . Gait instability     uses wheelchair or assistance  . H/O steroid therapy     IV infusion every 6 to 8 weeks- last 05-31-14 [facility]    PAST SURGICAL HISTORY: Past Surgical History  Procedure Laterality Date  . Leg surgery      sx as a child  . Tonsillectomy      as a child  . Partial hysterectomy    . Debridement skin / sq / muscle of arm Right 07/13/2003    I & D; debridement of tissue within triceps muscle  . Abdominal hysterectomy    . Melanoma excision    . Cesarean section    . Breast biopsy Right 09/20/2012    Procedure: BREAST BIOPSY WITH NEEDLE LOCALIZATION;  Surgeon: Rolm Bookbinder, MD;  Location: [facility];  Service: General;  Laterality: Right;  . Colonoscopy with propofol N/A 07/13/2014    Procedure: COLONOSCOPY WITH PROPOFOL;  Surgeon: Beryle Beams, MD;  Location: [facility];  Service: Endoscopy;  Laterality: N/A;    FAMILY HISTORY: Family History  Problem Relation Age of Onset  . Hyperlipidemia Mother   . Hypertension Mother   . Hypertension Father   . Diabetes type II Father     SOCIAL HISTORY:  Social History   Social History  . Marital Status: Married    Spouse Name: N/A  . Number of Children: N/A  . Years of Education: N/A   Occupational History  . Not on file.   Social History Main Topics  . Smoking status: Never Smoker   . Smokeless tobacco: Not on file  . Alcohol Use: Yes     Comment: social  . Drug Use: No  . Sexual Activity: Not on file   Other Topics Concern  . Not on file   Social History Narrative     PHYSICAL EXAM  Filed Vitals:   03/12/15 1555  BP: 118/80  Pulse: 74  Resp: 14  Height: 5\' 6"  (1.676 m)  Weight: 130 lb (58.968 kg)    Body mass index is 20.99 kg/(m^2).   General: The patient is well-developed and well-nourished and in no acute distress  Eyes:  Funduscopic exam shows normal optic discs and retinal vessels.   Skin: Extremities are without significant edema.  Neurologic Exam  Mental status: The patient is alert and oriented x 3 at the time of the examination. The patient has apparent normal recent and remote memory, with an apparently normal attention span and concentration ability.   Speech is normal.  Cranial nerves: Extraocular movements are full. Pupils are equal, round, and reactive to light and accomodation.  Visual fields are full.  Facial symmetry is present. There is good facial sensation to soft touch bilaterally.Facial strength is normal.  Trapezius and sternocleidomastoid strength is normal. No dysarthria is noted.  No obvious hearing deficits are noted.  Motor:  Muscle bulk and tone are  normal. Strength is  3 / 5 in proximal legs and 4/5 distally.   Sensory: Sensory testing is intact to pinprick, soft touch, vibration sensation, and position sense i arms but mild decreased touch in left leg.   .  Coordination: Cerebellar testing reveals reduced finger-nose-finger and poor heel-to-shin bilaterally.  Gait and station: She needs to use arms to stand.    Station is unsteady and gait requires support.  She can not tandem.  Romberg is negative.   Reflexes: Deep tendon reflexes are symmetric and increased in legs bilaterally. Plantar responses are normal.    DIAGNOSTIC DATA (LABS, IMAGING, TESTING) - I reviewed patient records, labs, notes, testing and imaging myself where available.  Lab Results  Component Value Date   WBC 7.3 08/17/2014   HGB 12.5 08/17/2014   HCT 38.0 08/17/2014   MCV 94.5 08/17/2014   PLT 178 08/17/2014      Component Value Date/Time   NA 137 08/18/2014 0657   K 4.0 08/18/2014 0657   CL 105 08/18/2014 0657   CO2 28 08/18/2014 0657   GLUCOSE 91 08/18/2014 0657   BUN 7 08/18/2014 0657   CREATININE 0.43* 08/18/2014 0657   CALCIUM 8.1* 08/18/2014 0657   PROT 5.9* 08/17/2014 0540   ALBUMIN 3.2* 08/17/2014 0540   AST 18 08/17/2014 0540   ALT 13 08/17/2014 0540   ALKPHOS 44 08/17/2014 0540   BILITOT 0.6 08/17/2014 0540   GFRNONAA >90 08/18/2014 0657   GFRAA >90 08/18/2014 0657      ASSESSMENT AND PLAN  Multiple sclerosis  Ataxic gait  Chronic fatigue  Chronic insomnia  Depression with anxiety  Urinary dysfunction  1.   Renew clonazepam and tramadol.   Continue when necessary Nucynta once a day 2.    I started lamotrigine and she will titrate up to 100 mg by mouth twice a day. Hopefully this will help her allodynia. 3.   Continue other medications.  4.   We discussed getting an MRI of the brain and spinal cord to make sure that she is not having subclinical progression. If present, I will strongly urge her to get back on a disease  modifying therapy. Her daughter is getting married in a few weeks and she wants to wait till after that.  5.  She needs to exercise and stay active  She will return to see me in 4 months or sooner if she has new or worsening neurologic symptoms.   Michal Callicott A. Felecia Shelling, MD, PhD 7/93/9030, 0:92 PM Certified in Neurology, Clinical Neurophysiology, Sleep Medicine, Pain Medicine and Neuroimaging  [facility] [address] [place], [place] [address] [phone]

## 2015-03-18 ENCOUNTER — Telehealth: Payer: Self-pay | Admitting: Neurology

## 2015-03-18 NOTE — Telephone Encounter (Signed)
I have spoken with Tonya Powers this afternoon and advised [employer] non-formulary exception request form has been completed and faxed back to [facility], with fax confirmation received/fim

## 2015-03-18 NOTE — Telephone Encounter (Signed)
Patient called regarding tapentadol (NUCYNTA) 50 MG TABS tablet. Patient needs a letter for [facility] stating she has to have this medication vs extended release. ER does not work for her.

## 2015-03-19 ENCOUNTER — Telehealth: Payer: Self-pay | Admitting: Neurology

## 2015-03-19 NOTE — Telephone Encounter (Signed)
I have spoken with Leatta this morning and advised that [employer] has approved Nucynta./fim

## 2015-03-19 NOTE — Telephone Encounter (Signed)
Mark with Tonya Powers is calling and states that Rx Nucynta 50 mg has been approved eff 03/18/15 for a period of 1 year.  A letter will be sent out to patient.

## 2015-04-02 ENCOUNTER — Ambulatory Visit
Admission: RE | Admit: 2015-04-02 | Discharge: 2015-04-02 | Disposition: A | Discharge status: Home / Self Care | Payer: Medicare Other | Source: Ambulatory Visit | Attending: Neurology | Admitting: Neurology

## 2015-04-02 DIAGNOSIS — R260 Ataxic gait: Secondary | ICD-10-CM

## 2015-04-02 DIAGNOSIS — G35 Multiple sclerosis: Secondary | ICD-10-CM

## 2015-04-02 MED ORDER — GADOBENATE DIMEGLUMINE 529 MG/ML IV SOLN
10.0000 mL | Freq: Once | INTRAVENOUS | Status: AC | PRN
  Administered 2015-04-02: 10 mL via INTRAVENOUS

## 2015-04-08 ENCOUNTER — Encounter: Payer: Self-pay | Admitting: Neurology

## 2015-04-09 NOTE — Telephone Encounter (Signed)
-----   Message from Britt Bottom, MD sent at 04/08/2015  5:07 PM EDT ----- MRI of the spine and brain show a few MS plaques. None of these appeared to be acute. Let's see if we can get her old MRIs from [facility] ( cervical spine and brain) for comparison.

## 2015-04-22 ENCOUNTER — Telehealth: Payer: Self-pay | Admitting: Neurology

## 2015-04-22 MED ORDER — ZOLPIDEM TARTRATE 10 MG PO TABS: 10.0000 mg | ORAL_TABLET | Freq: Every evening | ORAL | Status: DC | PRN

## 2015-04-22 NOTE — Telephone Encounter (Signed)
Pt called and would like to know results of MRI. Please call and advise [phone]

## 2015-04-22 NOTE — Telephone Encounter (Signed)
I have spoken with Tonya Powers this afternoon and advised that we have not received old mri's from [place] for comparison yet.  I have requested them again/fim

## 2015-04-22 NOTE — Telephone Encounter (Signed)
Pt needs refill on zolpidem (AMBIEN) 10 MG tablet. Thank you

## 2015-04-23 ENCOUNTER — Encounter: Payer: Self-pay | Admitting: *Deleted

## 2015-04-23 NOTE — Telephone Encounter (Signed)
Per other encounter, Rx is ready for pick up.  I called back.  Got no answer.  Left message.

## 2015-04-23 NOTE — Progress Notes (Signed)
Ambien rx. up front [facility]/fim 

## 2015-04-23 NOTE — Telephone Encounter (Signed)
Pt called inquiring if refill is ready. Not a front yet.

## 2015-04-23 NOTE — Telephone Encounter (Signed)
Pt called to check on Rx. Not at front yet.

## 2015-04-26 ENCOUNTER — Other Ambulatory Visit: Payer: Self-pay

## 2015-04-26 DIAGNOSIS — Z1231 Encounter for screening mammogram for malignant neoplasm of breast: Secondary | ICD-10-CM

## 2015-05-13 ENCOUNTER — Other Ambulatory Visit: Payer: Self-pay | Admitting: Neurology

## 2015-05-13 ENCOUNTER — Encounter: Payer: Self-pay | Admitting: *Deleted

## 2015-05-13 MED ORDER — AMPHETAMINE-DEXTROAMPHETAMINE 20 MG PO TABS: 20.0000 mg | ORAL_TABLET | Freq: Two times a day (BID) | ORAL | Status: DC

## 2015-05-13 NOTE — Telephone Encounter (Signed)
Pt called requesting refill for amphetamine-dextroamphetamine (ADDERALL) 20 MG tablet . Pt advised RX will be ready within 24 hrs unless informed otherwise by RN

## 2015-05-13 NOTE — Progress Notes (Signed)
Adderall rx. up front [facility]/fim 

## 2015-05-13 NOTE — Telephone Encounter (Signed)
Request entered, forwarded to provider for approval.  

## 2015-05-29 ENCOUNTER — Telehealth: Payer: Self-pay | Admitting: *Deleted

## 2015-05-29 DIAGNOSIS — R39198 Other difficulties with micturition: Secondary | ICD-10-CM

## 2015-05-29 DIAGNOSIS — R8290 Unspecified abnormal findings in urine: Secondary | ICD-10-CM

## 2015-05-29 DIAGNOSIS — R3989 Other symptoms and signs involving the genitourinary system: Secondary | ICD-10-CM

## 2015-05-29 DIAGNOSIS — G35 Multiple sclerosis: Secondary | ICD-10-CM

## 2015-05-29 NOTE — Telephone Encounter (Signed)
I have spoken with Hargun this afternoon and advised that per RAS, ok for SM 1gm IV tomorrow--Tina can do infusion at 2pm.  She is agreeable, sts. will be here at 2.  She also c/o strong odor to urine, difficulty voiding, urine darker than normal.  Orders for u/a and urine c&s in epic and will check tomorrow/fim

## 2015-05-30 ENCOUNTER — Other Ambulatory Visit: Payer: Self-pay | Admitting: *Deleted

## 2015-05-30 ENCOUNTER — Other Ambulatory Visit (INDEPENDENT_AMBULATORY_CARE_PROVIDER_SITE_OTHER): Payer: Self-pay

## 2015-05-30 DIAGNOSIS — Z0289 Encounter for other administrative examinations: Secondary | ICD-10-CM

## 2015-05-30 DIAGNOSIS — G35 Multiple sclerosis: Secondary | ICD-10-CM

## 2015-05-30 DIAGNOSIS — R39198 Other difficulties with micturition: Secondary | ICD-10-CM

## 2015-05-30 DIAGNOSIS — R8290 Unspecified abnormal findings in urine: Secondary | ICD-10-CM

## 2015-05-30 DIAGNOSIS — R3989 Other symptoms and signs involving the genitourinary system: Secondary | ICD-10-CM

## 2015-05-30 MED ORDER — CIPROFLOXACIN HCL 500 MG PO TABS: 500.0000 mg | ORAL_TABLET | Freq: Two times a day (BID) | ORAL | Status: DC

## 2015-05-31 ENCOUNTER — Telehealth: Payer: Self-pay | Admitting: *Deleted

## 2015-05-31 LAB — URINALYSIS
Bilirubin, UA: NEGATIVE
GLUCOSE, UA: NEGATIVE
Ketones, UA: NEGATIVE
Leukocytes, UA: NEGATIVE
Nitrite, UA: NEGATIVE
PROTEIN UA: NEGATIVE
RBC, UA: NEGATIVE
Specific Gravity, UA: 1.023 (ref 1.005–1.030)
Urobilinogen, Ur: 0.2 mg/dL (ref 0.2–1.0)
pH, UA: 6.5 (ref 5.0–7.5)

## 2015-05-31 LAB — URINE CULTURE

## 2015-05-31 NOTE — Telephone Encounter (Signed)
I have spoken with Tonya Powers this morning and per RAS, advised u/a is ok, she can stop antibiotics.  She verbalized understanding of same/fim

## 2015-05-31 NOTE — Telephone Encounter (Signed)
-----   Message from Britt Bottom, MD sent at 05/31/2015  8:00 AM EST ----- Her UA looked good. She can stop the antibiotics.

## 2015-06-03 ENCOUNTER — Ambulatory Visit
Admission: RE | Admit: 2015-06-03 | Discharge: 2015-06-03 | Disposition: A | Discharge status: Home / Self Care | Payer: Medicare Other | Source: Ambulatory Visit

## 2015-06-03 DIAGNOSIS — Z1231 Encounter for screening mammogram for malignant neoplasm of breast: Secondary | ICD-10-CM

## 2015-06-12 ENCOUNTER — Telehealth: Payer: Self-pay | Admitting: Neurology

## 2015-06-12 MED ORDER — TAPENTADOL HCL 50 MG PO TABS: 50.0000 mg | ORAL_TABLET | Freq: Three times a day (TID) | ORAL | Status: DC | PRN

## 2015-06-12 NOTE — Telephone Encounter (Signed)
Request entered, forwarded to provider for approval.  

## 2015-06-12 NOTE — Telephone Encounter (Signed)
Patient is calling to get a written Rx for tapentadol (NUCYNTA) 50 MG TABS tablet. I advised the Rx will be ready in 24 hours unless otherwise advised by the nurse. Thank you.

## 2015-06-13 NOTE — Telephone Encounter (Signed)
Message sent to clinic to follow up.  

## 2015-06-13 NOTE — Telephone Encounter (Signed)
Patient called to check status of refill request.

## 2015-06-14 NOTE — Telephone Encounter (Signed)
Clinic verified this Rx was placed at the front desk for pickup yesterday,  I called the patient to advise.  She expressed understanding and appreciation.

## 2015-07-02 ENCOUNTER — Other Ambulatory Visit: Payer: Self-pay | Admitting: Neurology

## 2015-07-02 ENCOUNTER — Encounter: Payer: Self-pay | Admitting: *Deleted

## 2015-07-02 MED ORDER — AMPHETAMINE-DEXTROAMPHETAMINE 20 MG PO TABS: 20.0000 mg | ORAL_TABLET | Freq: Two times a day (BID) | ORAL | Status: DC

## 2015-07-02 NOTE — Telephone Encounter (Signed)
Patient called to request refill of amphetamine-dextroamphetamine (ADDERALL) 20 MG tablet °

## 2015-07-02 NOTE — Progress Notes (Signed)
Adderall rx. up front [facility]/fim 

## 2015-07-02 NOTE — Telephone Encounter (Signed)
Request entered, forwarded to provider for review.  

## 2015-07-09 ENCOUNTER — Encounter: Payer: Self-pay | Admitting: Neurology

## 2015-07-09 ENCOUNTER — Ambulatory Visit (INDEPENDENT_AMBULATORY_CARE_PROVIDER_SITE_OTHER): Payer: Medicare Other | Admitting: Neurology

## 2015-07-09 VITALS — BP 110/70 | HR 74 | Resp 14 | Ht 66.0 in | Wt 130.0 lb

## 2015-07-09 DIAGNOSIS — G4700 Insomnia, unspecified: Secondary | ICD-10-CM | POA: Diagnosis not present

## 2015-07-09 DIAGNOSIS — F5104 Psychophysiologic insomnia: Secondary | ICD-10-CM

## 2015-07-09 DIAGNOSIS — F418 Other specified anxiety disorders: Secondary | ICD-10-CM

## 2015-07-09 DIAGNOSIS — R39198 Other difficulties with micturition: Secondary | ICD-10-CM | POA: Diagnosis not present

## 2015-07-09 DIAGNOSIS — G35 Multiple sclerosis: Secondary | ICD-10-CM | POA: Diagnosis not present

## 2015-07-09 DIAGNOSIS — R5382 Chronic fatigue, unspecified: Secondary | ICD-10-CM

## 2015-07-09 DIAGNOSIS — R260 Ataxic gait: Secondary | ICD-10-CM | POA: Diagnosis not present

## 2015-07-09 MED ORDER — LAMOTRIGINE 100 MG PO TABS: 100.0000 mg | ORAL_TABLET | Freq: Two times a day (BID) | ORAL | Status: DC

## 2015-07-09 MED ORDER — AMPHETAMINE-DEXTROAMPHETAMINE 20 MG PO TABS: 20.0000 mg | ORAL_TABLET | Freq: Two times a day (BID) | ORAL | Status: DC

## 2015-07-09 MED ORDER — TAMSULOSIN HCL 0.4 MG PO CAPS: 0.4000 mg | ORAL_CAPSULE | Freq: Every day | ORAL | Status: DC

## 2015-07-09 MED ORDER — TAPENTADOL HCL 50 MG PO TABS: 50.0000 mg | ORAL_TABLET | Freq: Three times a day (TID) | ORAL | Status: DC | PRN

## 2015-07-09 NOTE — Progress Notes (Signed)
[facility]  PATIENT: Tonya Powers DOB: 1960/11/29  REFERRING CLINICIAN: Mayra Neer is PCP HISTORY FROM: Patient  REASON FOR VISIT: MS and poor gait   HISTORICAL  CHIEF COMPLAINT:  Chief Complaint  Patient presents with  . Multiple Sclerosis    She continues off of MS therapy--receives occasional IV SoluMedrol for sx. mx.  She would like to discuss Ocrelizumab/fim    HISTORY OF PRESENT ILLNESS:  Tonya Powers is a 55 year old woman with MS.   She feels her MS is stable.     She felt best on Tysabri but she still progressed while on it. Tecfidera was poorly tolerated and she stopped.     Gait/strength/sensation:    Gait is poor and she uses a walker or an Transport planner in the house and two walking sticks / canes when outside short distances.      She has numbness from the lower chest down and in her legs.  More recently mild tingling has become a much more painful allodynia.      Her clothes feel like sandpaper from the bra level on down to her toes.     We started lamotrigine for her pain at the last visit and it is helping a little.  We discussed a higher dose.    Pain is around the hip and buttock regions and down her leg somewhat.  Spasticity is painful and she has dysesthesias.    Nucynta helps as much as opiate and is better tolerated (takes instead of tramadol at lunch).  She takes gabapentin 800 mg 3-4 a day.       Vision:  She denies MS related eye issues.  Bladder/bowel:  Bladder is poor with both hesitancy and urgency.  She gets incontinence at times.    Taking desmopressin has greatly helped her nocturia and that helps her sleep better.  She reports severe constipation. Linzess did not help.    Nucynta does not worsen it.       Fatigue/sleep:   Fatigue is a daily problem.  She reports that her fatigue is more physical and cognitive.. This is helped best with Solu-Medrol infusions intermittently.  She also takes Adderall 20 mg, once most days but  can take up to 2 a day.  She reports sleep onset and sleep maintenance insomnia.   She takes clonazepam 1 mg and Ambien every night due to difficulty falling and staying asleep. Clonazepam also helps her leg spasms as well as helps her to fall asleep.  Mood/cognition:   She notes depression and anxiety. Stress is better since her daughter's wedding is behind her.  Her poor mobility depresses her.   Her depression is usually worse when she has more pain   She is sometimes irritable. She prefers not to take an antidepressant.    She rarely takes Xanax 0.5 mg when she has more anxiety. She notes more trouble with cognition, especially short term memory.   MS History:  She presented with optic neuritis in 1991 followed shortly by difficulties with leg weakness and by right trigeminal neuralgia. In retrospect, couple years earlier when she had her daughter, has some difficulties with her legs and needed to go on short-term disability. At first she was not diagnosed with MS but after more of the symptoms she underwent MRI testing and had a lumbar puncture by Dr. Johnnye Sima. The imaging and the CSF was consistent with multiple sclerosis. When Betaseron became available she was placed on that. She was on  Betaseron for about 10 years. She felt that she did not have too many exacerbations during that time but she had a lot of difficulty tolerating the Betaseron due to skin reactions. Around 12-15 years ago, she started to use a cane and she has had progressive gait disturbance over the last decade. Around the house for short distances, she uses a walker but uses her scooter for longer distances. Outside she uses a wheelchair pushed by others   REVIEW OF SYSTEMS:  Constitutional: No fevers, chills, sweats, or change in appetite.  She has Fatigue Eyes: No visual changes, double vision, eye pain Ear, nose and throat: No hearing loss, ear pain, nasal congestion, sore throat Cardiovascular: No chest pain,  palpitations Respiratory:  No shortness of breath at rest or with exertion.   No wheezes GastrointestinaI: Mild dysphagia.  Constipation.  No nausea, vomiting, diarrhea.   Genitourinary:  No dysuria but has urinary retention and frequency.  Desmopressin helps nocturia. Musculoskeletal:  No neck pain,   Has lower back pain and buttocks.  Hips ans other joints ok Integumentary: No rash, pruritus, skin lesions Neurological: as above Psychiatric: see above Endocrine: No palpitations, diaphoresis, change in appetite, change in weigh or increased thirst Hematologic/Lymphatic:  No anemia, purpura, petechiae. Allergic/Immunologic: No itchy/runny eyes, nasal congestion, recent allergic reactions, rashes  ALLERGIES: Allergies  Allergen Reactions  . Codeine Nausea And Vomiting    HOME MEDICATIONS: Outpatient Prescriptions Prior to Visit  Medication Sig Dispense Refill  . acetaminophen (TYLENOL) 500 MG tablet Take 1,000 mg by mouth every 6 (six) hours as needed for headache (headache).    . ALPRAZolam (XANAX) 0.5 MG tablet Take 0.5 mg by mouth daily as needed for anxiety (anxiety).     Marland Kitchen amphetamine-dextroamphetamine (ADDERALL) 20 MG tablet Take 1 tablet (20 mg total) by mouth 2 (two) times daily. 60 tablet 0  . Cholecalciferol (VITAMIN D PO) Take 1 tablet by mouth every morning.    . clonazePAM (KLONOPIN) 1 MG tablet Take 1 tablet (1 mg total) by mouth at bedtime as needed for anxiety (sleep). 30 tablet 5  . desmopressin (DDAVP) 0.1 MG tablet Take 0.1 mg by mouth at bedtime as needed (bladder.).    Marland Kitchen diazepam (VALIUM) 5 MG tablet Take 1 tablet (5 mg total) by mouth every 6 (six) hours as needed for anxiety. 90 tablet 3  . estradiol (CLIMARA - DOSED IN MG/24 HR) 0.075 mg/24hr patch Place 0.075 mg onto the skin once a week. Put on Saturdays.  10  . gabapentin (NEURONTIN) 800 MG tablet Take 1 tablet (800 mg total) by mouth 4 (four) times daily. 120 tablet 11  . ibuprofen (ADVIL,MOTRIN) 200 MG tablet  Take 400 mg by mouth every 6 (six) hours as needed for headache (headache).    . promethazine (PHENERGAN) 25 MG tablet Take 25 mg by mouth every 6 (six) hours as needed for nausea (nausea).     . tapentadol (NUCYNTA) 50 MG TABS tablet Take 1 tablet (50 mg total) by mouth 3 (three) times daily as needed for moderate pain or severe pain (pain). 90 tablet 0  . traMADol (ULTRAM) 50 MG tablet Take 1 tablet (50 mg total) by mouth 3 (three) times daily as needed for moderate pain (pain). 90 tablet 5  . zolpidem (AMBIEN) 10 MG tablet Take 1 tablet (10 mg total) by mouth at bedtime as needed for sleep (sleep). 30 tablet 5  . baclofen (LIORESAL) 10 MG tablet Take 1 tablet (10 mg total) by mouth 3 (  three) times daily. (Patient not taking: Reported on 08/16/2014) 905 each 5  . ciprofloxacin (CIPRO) 500 MG tablet Take 1 tablet (500 mg total) by mouth 2 (two) times daily. (Patient not taking: Reported on 07/09/2015) 14 tablet 0  . lamoTRIgine (LAMICTAL) 100 MG tablet Take 1 tablet (100 mg total) by mouth daily. (Patient not taking: Reported on 07/09/2015) 60 tablet 11  . lamoTRIgine (LAMICTAL) 25 MG tablet Take one pill qd x 1 wk, then one pill bid x 1 wk, then one pill tid x 1 wk, then 2 pills bid x 1 wk, then start other prescription (Patient not taking: Reported on 07/09/2015) 70 tablet 3   No facility-administered medications prior to visit.    PAST MEDICAL HISTORY: Past Medical History  Diagnosis Date  . Multiple sclerosis ([place])   . Skin cancer   . History of breast biopsy   . PONV (postoperative nausea and vomiting)   . Anxiety   . Depression   . Neuromuscular disorder ([place])     MS  . Gait instability     uses wheelchair or assistance  . H/O steroid therapy     IV infusion every 6 to 8 weeks- last 05-31-14 [facility]    PAST SURGICAL HISTORY: Past Surgical History  Procedure Laterality Date  . Leg surgery      sx as a child  . Tonsillectomy      as a child  . Partial hysterectomy     . Debridement skin / sq / muscle of arm Right 07/13/2003    I & D; debridement of tissue within triceps muscle  . Abdominal hysterectomy    . Melanoma excision    . Cesarean section    . Breast biopsy Right 09/20/2012    Procedure: BREAST BIOPSY WITH NEEDLE LOCALIZATION;  Surgeon: Rolm Bookbinder, MD;  Location: [place];  Service: General;  Laterality: Right;  . Colonoscopy with propofol N/A 07/13/2014    Procedure: COLONOSCOPY WITH PROPOFOL;  Surgeon: Beryle Beams, MD;  Location: [facility];  Service: Endoscopy;  Laterality: N/A;    FAMILY HISTORY: Family History  Problem Relation Age of Onset  . Hyperlipidemia Mother   . Hypertension Mother   . Hypertension Father   . Diabetes type II Father     SOCIAL HISTORY:  Social History   Social History  . Marital Status: Married    Spouse Name: N/A  . Number of Children: N/A  . Years of Education: N/A   Occupational History  . Not on file.   Social History Main Topics  . Smoking status: Never Smoker   . Smokeless tobacco: Not on file  . Alcohol Use: Yes     Comment: social  . Drug Use: No  . Sexual Activity: Not on file   Other Topics Concern  . Not on file   Social History Narrative     PHYSICAL EXAM  Filed Vitals:   07/09/15 1304  BP: 110/70  Pulse: 74  Resp: 14  Height: 5\' 6"  (1.676 m)  Weight: 130 lb (58.968 kg)    Body mass index is 20.99 kg/(m^2).   General: The patient is well-developed and well-nourished and in no acute distress  Skin: Extremities are without significant edema.  Neurologic Exam  Mental status: The patient is alert and oriented x 3 at the time of the examination. The patient has apparent normal recent and remote memory, with an apparently normal attention span and concentration ability.   Speech is  normal.  Cranial nerves: Extraocular movements are full. Pupils are equal, round, and reactive to light and accomodation.  Visual fields are full.  Facial  symmetry is present. There is good facial sensation to soft touch bilaterally.Facial strength is normal.  Trapezius and sternocleidomastoid strength is normal. No dysarthria is noted.  No obvious hearing deficits are noted.  Motor:  Muscle bulk and tone are normal. Strength is  3 / 5 in proximal legs and 4/5 distally.   Sensory: Sensory testing is intact to pinprick, soft touch, vibration sensation, and position sense i arms but mild decreased touch in left leg.   .  Coordination: Cerebellar testing reveals reduced finger-nose-finger and poor heel-to-shin bilaterally.  Gait and station: She needs to use arms to stand.    Station is unsteady and gait requires support.  She can not tandem.     Reflexes: Deep tendon reflexes are symmetric and increased in legs bilaterally with spread at the knees. Plantar responses are normal.    DIAGNOSTIC DATA (LABS, IMAGING, TESTING) - I reviewed patient records, labs, notes, testing and imaging myself where available.  Lab Results  Component Value Date   WBC 7.3 08/17/2014   HGB 12.5 08/17/2014   HCT 38.0 08/17/2014   MCV 94.5 08/17/2014   PLT 178 08/17/2014      Component Value Date/Time   NA 137 08/18/2014 0657   K 4.0 08/18/2014 0657   CL 105 08/18/2014 0657   CO2 28 08/18/2014 0657   GLUCOSE 91 08/18/2014 0657   BUN 7 08/18/2014 0657   CREATININE 0.43* 08/18/2014 0657   CALCIUM 8.1* 08/18/2014 0657   PROT 5.9* 08/17/2014 0540   ALBUMIN 3.2* 08/17/2014 0540   AST 18 08/17/2014 0540   ALT 13 08/17/2014 0540   ALKPHOS 44 08/17/2014 0540   BILITOT 0.6 08/17/2014 0540   GFRNONAA >90 08/18/2014 0657   GFRAA >90 08/18/2014 0657      ASSESSMENT AND PLAN  Multiple sclerosis ([facility])  Ataxic gait  Chronic fatigue  Chronic insomnia  Depression with anxiety  Urinary dysfunction  1.   Renew Adderall and Nucynta.    2.    Increase lamotrigine to 100 mg po bid.   Increase further based on results.  3.   Continue other medications.   4.   Tamsulosin trial for urinary hesitancy.   If no better after 2 weeks, stop.   5.    She will return to see me in 4 months or sooner if she has new or worsening neurologic symptoms.   Allona Gondek A. Felecia Shelling, MD, PhD [id number], 0000000 PM Certified in Neurology, Clinical Neurophysiology, Sleep Medicine, Pain Medicine and Neuroimaging  [facility] [address] [place], [place] [address] [phone]

## 2015-08-07 DIAGNOSIS — Z79890 Hormone replacement therapy (postmenopausal): Secondary | ICD-10-CM | POA: Diagnosis not present

## 2015-08-07 DIAGNOSIS — L600 Ingrowing nail: Secondary | ICD-10-CM | POA: Diagnosis not present

## 2015-08-07 DIAGNOSIS — F411 Generalized anxiety disorder: Secondary | ICD-10-CM | POA: Diagnosis not present

## 2015-08-20 ENCOUNTER — Telehealth: Payer: Self-pay | Admitting: Neurology

## 2015-08-20 NOTE — Telephone Encounter (Signed)
Patient is calling and states that she is having trouble sleeping even though she takes 2 medications. Not sure if it is her MS. She is asking that you speak with Dr. Felecia Shelling about something that might help.  Also, she would like an order for PT so that she can get strength back in her legs.  Please call. Thanks!

## 2015-08-21 MED ORDER — DOXEPIN HCL 10 MG PO CAPS: 10.0000 mg | ORAL_CAPSULE | Freq: Every evening | ORAL | Status: DC | PRN

## 2015-08-21 NOTE — Telephone Encounter (Signed)
I have spoken with Tonya Powers and offered Trazodone--she sts. she believes she has tried that before and it "almost put me in a coma" for 2 days--caused excessive drowsiness/fim

## 2015-08-21 NOTE — Telephone Encounter (Signed)
I have spoken with Tonya Powers this afternoon and per RAS, offered Doxepin 10mg  po qhs, in addition to Ambien and Klonopin.  She is agreeable.  Rx. escribed to [facility] per her request/fim

## 2015-08-21 NOTE — Telephone Encounter (Signed)
I have spoken with Tonya Powers this morning.  She sts. she is having more trouble falling asleep lately, some trouble staying asleep due to having to bladder issues.  She is currenlty taking Ambien 10mg  and Klonopin 1mg  about 45 min. prior to bedtime.  She has Xanax and Diazepam at home, that she takes occ. for anxiety, but not at bedtime for sleep.  In the past she has tried Restoril--was on this for yrs. but it became ineffective so she was switched to Ambien.  Doesn't think she's tried Lunesta, Trazodone.  Will check with RAS and call her back/fim

## 2015-08-21 NOTE — Telephone Encounter (Signed)
Patient called back to advise that Dr. Felecia Shelling prescribed 100mg  Trazadone (1/2-1 tablets) September 2012 when he was at [facility], states it interfered with tapentadol (NUCYNTA) 50 MG TABS tablet, states she had a bad reaction, "I was out of it for 2 days".

## 2015-08-21 NOTE — Telephone Encounter (Signed)
Tonya Powers and per RAS will offer Doxepin 10mg  po qhs/fim

## 2015-08-21 NOTE — Telephone Encounter (Signed)
Patient is returning a call. °

## 2015-08-21 NOTE — Telephone Encounter (Signed)
Lets do trazodone 100 mg #30 with a 5 refills

## 2015-08-26 ENCOUNTER — Telehealth: Payer: Self-pay | Admitting: Neurology

## 2015-08-26 DIAGNOSIS — G35 Multiple sclerosis: Secondary | ICD-10-CM

## 2015-08-26 DIAGNOSIS — R260 Ataxic gait: Secondary | ICD-10-CM

## 2015-08-26 MED ORDER — AMPHETAMINE-DEXTROAMPHETAMINE 20 MG PO TABS: 20.0000 mg | ORAL_TABLET | Freq: Two times a day (BID) | ORAL | Status: DC

## 2015-08-26 NOTE — Telephone Encounter (Signed)
I have spoken with [place] this morning.  1--Adderall rx. is up front [facility].  2--I have spoken with Otila Kluver and she can do SM infusion at 1330 tomorrow.  3--Shemia sts. she is falling more often and has much trouble getting up.  PT order for gait/balance training; strength in lower legs in [facility], to be referred to [facility] neurorehab here in our office on [address] per [facility] request/fim

## 2015-08-26 NOTE — Telephone Encounter (Signed)
Pt called requesting refill for amphetamine-dextroamphetamine (ADDERALL) 20 MG tablet . She is also requesting appt for solu-medrol. Pt is experiencing increased pain in back and legs. She sts family thinks if she had some PT it would help to regain some strength in her legs. Please call at [phone]

## 2015-08-27 DIAGNOSIS — G35 Multiple sclerosis: Secondary | ICD-10-CM | POA: Diagnosis not present

## 2015-09-03 ENCOUNTER — Ambulatory Visit: Payer: Medicare Other | Attending: Neurology | Admitting: Physical Therapy

## 2015-09-03 DIAGNOSIS — R29898 Other symptoms and signs involving the musculoskeletal system: Secondary | ICD-10-CM | POA: Insufficient documentation

## 2015-09-03 DIAGNOSIS — G35 Multiple sclerosis: Secondary | ICD-10-CM | POA: Insufficient documentation

## 2015-09-03 DIAGNOSIS — R269 Unspecified abnormalities of gait and mobility: Secondary | ICD-10-CM | POA: Insufficient documentation

## 2015-09-03 NOTE — Therapy (Signed)
[facility] [address] [place], [place], [address] Phone: [phone]   Fax:  [phone]  Physical Therapy Evaluation  Patient Details  Name: Tonya Powers MRN: [id number] Date of Birth: 1961/01/19 Referring Provider: Dr.Richard Sater  Encounter Date: 09/03/2015      PT End of Session - 09/03/15 1328    Visit Number 1   Number of Visits 9   Date for PT Re-Evaluation 10/29/15   PT Start Time 1230   PT Stop Time 1308   PT Time Calculation (min) 38 min   Activity Tolerance Patient tolerated treatment well   Behavior During Therapy [facility] for tasks assessed/performed      Past Medical History  Diagnosis Date  . Multiple sclerosis ([place])   . Skin cancer   . History of breast biopsy   . PONV (postoperative nausea and vomiting)   . Anxiety   . Depression   . Neuromuscular disorder ([place])     MS  . Gait instability     uses wheelchair or assistance  . H/O steroid therapy     IV infusion every 6 to 8 weeks- last 05-31-14 [facility]    Past Surgical History  Procedure Laterality Date  . Leg surgery      sx as a child  . Tonsillectomy      as a child  . Partial hysterectomy    . Debridement skin / sq / muscle of arm Right 07/13/2003    I & D; debridement of tissue within triceps muscle  . Abdominal hysterectomy    . Melanoma excision    . Cesarean section    . Breast biopsy Right 09/20/2012    Procedure: BREAST BIOPSY WITH NEEDLE LOCALIZATION;  Surgeon: Rolm Bookbinder, MD;  Location: [facility];  Service: General;  Laterality: Right;  . Colonoscopy with propofol N/A 07/13/2014    Procedure: COLONOSCOPY WITH PROPOFOL;  Surgeon: Beryle Beams, MD;  Location: [facility];  Service: Endoscopy;  Laterality: N/A;    There were no vitals filed for this visit.  Visit Diagnosis:  Multiple sclerosis ([place]) - Plan: PT plan of care cert/re-cert  Abnormality of gait - Plan: PT plan of care  cert/re-cert  Weakness of both lower extremities - Plan: PT plan of care cert/re-cert      Subjective Assessment - 09/03/15 1233    Subjective Pt is a 55 y/o female who presents to OPPT with diagnosis of MS.  Pt diagnosed with MS in 1991; and reports currently is secondary progressive.  Pt presents today with c/o weakness and pain affecting functional mobility.   Pertinent History MS, anxiety, depression   How long can you walk comfortably? "not very far" points across room (~50')   Patient Stated Goals improve strength in legs; improve mobility; be able to walk into places by herself   Currently in Pain? Yes   Pain Score 4    Pain Location Back   Pain Orientation Lower   Pain Descriptors / Indicators Burning   Pain Radiating Towards bil lower extremities   Pain Onset More than a month ago   Pain Frequency Constant   Aggravating Factors  walking   Pain Relieving Factors sitting, resting            [facility] PT Assessment - 09/03/15 1239    Assessment   Medical Diagnosis MS   Referring Provider Dr.Richard Sater   Onset Date/Surgical Date --  1991   Next MD Visit May  2017   Prior Therapy many years ago   Precautions   Precautions Fall   Restrictions   Weight Bearing Restrictions No   Balance Screen   Has the patient fallen in the past 6 months Yes   How many times? 1-2 times per day   Has the patient had a decrease in activity level because of a fear of falling?  Yes   Is the patient reluctant to leave their home because of a fear of falling?  No   Home Environment   Living Environment Private residence   Living Arrangements Spouse/significant other   Type of [place] One level   Home Equipment Wheelchair - power;Wheelchair - [employer] - 4 wheels;Cane - single point;Shower seat;Grab bars - tub/shower;Grab bars - toilet;Toilet riser   Prior Function   Level of Independence Independent with homemaking with wheelchair    Vocation On disability   Leisure water aerobics; still driving, yoga at home, visit with friends   Observation/Other Assessments   Focus on Therapeutic Outcomes (FOTO)  n/a   Posture/Postural Control   Posture/Postural Control Postural limitations   Postural Limitations Forward head;Rounded Shoulders   Strength   Overall Strength Comments tested in sitting; suspect hip abdct and ext weakness due to gait deviations   Strength Assessment Site Hip;Knee;Ankle   Right Hip Flexion 3/5   Left Hip Flexion 3-/5   Right Knee Flexion 3/5   Right Knee Extension 4/5   Left Knee Flexion 3/5   Left Knee Extension 3+/5   Right Ankle Dorsiflexion 3/5   Left Ankle Dorsiflexion 4-/5   Ambulation/Gait   Ambulation/Gait Yes   Ambulation/Gait Assistance 4: Min assist   Ambulation Distance (Feet) 75 Feet   Assistive device 4-wheeled walker   Gait Pattern Decreased dorsiflexion - right;Decreased dorsiflexion - left;Right foot flat;Left foot flat;Shuffle;Right flexed knee in stance;Left flexed knee in stance;Trendelenburg;Narrow base of support;Poor foot clearance - left;Poor foot clearance - right   Ambulation Surface Level;Indoor   Gait Comments heavy reliance on UEs for support; min A needed with 180 degree turns   Standardized Balance Assessment   Standardized Balance Assessment Timed Up and Go Test;10 meter walk test   10 Meter Walk 0.66 ft/sec  49.39 sec   Timed Up and Go Test   Normal TUG (seconds) 39.66   High Level Balance   High Level Balance Comments unable to stand without UE support; unable to stand unsupported; heavy reliance on RW for balance                           PT Education - 09/03/15 1327    Education provided Yes   Education Details clinical findings; POC and goals of care   Person(s) Educated Patient   Methods Explanation   Comprehension Verbalized understanding          PT Short Term Goals - 09/03/15 1333    PT SHORT TERM GOAL #1   Title  independent with HEP (10/01/15)   Time 4   Period Weeks   Status New   PT SHORT TERM GOAL #2   Title improve gait velocity to > 1.0 ft/sec for improved mobiltiy with RW (10/01/15)   Time 4   Period Weeks   Status New   PT SHORT TERM GOAL #3   Title improve timed up and go to < 35 sec with RW for improved mobiltiy (10/01/15)  Time 4   Period Weeks   Status New   PT SHORT TERM GOAL #4   Title stand with intermittent UE support x 3 min for improved activity tolerance (10/01/15)   Time 4   Period Weeks   Status New           PT Long Term Goals - October 03, 2015 1335    PT LONG TERM GOAL #1   Title verbalize understanding of fall prevention strategies for decreased fall risk (10/29/15)   Time 8   Period Weeks   Status New   PT LONG TERM GOAL #2   Title improve gait velocity to > 1.3 ft/sec for improved mobiltiy with RW (10/29/15)   Time 8   Period Weeks   Status New   PT LONG TERM GOAL #3   Title improve timed up and go to < 30 sec for improved mobility with RW (10/29/15)   Time 8   Period Weeks   Status New   PT LONG TERM GOAL #4   Title tolerate standing 5 min without UE support for improved function and ADLs (10/29/15)   Time 8   Period Weeks   Status New   PT LONG TERM GOAL #5   Title ambulate > 50' with LRAD and be able to fold device (rollator/RW) independently in order to walk into community buildings independently (10/29/15)   Time 8   Period Weeks   Status New               Plan - 10/03/2015 1328    Clinical Impression Statement Pt is a 55 y/o female who presents to OPPT for a moderate complexity evaluation of MS.  Pt demonstrates decreased functional mobility as a result of muscle weakness and tightness and impaired balance.  Pt reports 1-2 falls per day, but feel this is partly due to unsafe behaviors in the home.  Recommended pt use power w/c or rollator at all times to decrease fall risk.  Pt will benefit from PT to maximize function and independence.  Will plan to  initiate HEP and assist with any equipment recommendations to improve independence.   Pt will benefit from skilled therapeutic intervention in order to improve on the following deficits Abnormal gait;Decreased balance;Decreased activity tolerance;Decreased coordination;Pain;Decreased strength;Postural dysfunction;Decreased mobility;Decreased knowledge of use of DME;Difficulty walking;Impaired perceived functional ability;Decreased safety awareness  will not directly address pain   Rehab Potential Good   PT Frequency 1x / week  recommend 2x/wk but pt requesting 1x/wk   PT Duration 8 weeks   PT Treatment/Interventions ADLs/Self Care Home Management;Cryotherapy;Electrical Stimulation;Moist Heat;Patient/family education;Neuromuscular re-education;Balance training;Therapeutic exercise;Therapeutic activities;Functional mobility training;Stair training;Gait training;Orthotic Fit/Training;Energy conservation   PT Next Visit Plan HEP for strengthening; gait with rollator/RW   Consulted and Agree with Plan of Care Patient          G-Codes - Oct 03, 2015 1345    Functional Assessment Tool Used clinical judgement gait velocity 0.66 ft/sec   Functional Limitation Mobility: Walking and moving around   Mobility: Walking and Moving Around Current Status [id number]) At least 60 percent but less than 80 percent impaired, limited or restricted   Mobility: Walking and Moving Around Goal Status [id number]) At least 40 percent but less than 60 percent impaired, limited or restricted       Problem List Patient Active Problem List   Diagnosis Date Noted  . Gastroenteritis 08/16/2014  . Nausea vomiting and diarrhea 08/16/2014  . Hypokalemia 08/16/2014  . Multiple sclerosis ([place]) 07/24/2014  . Ataxic gait  07/24/2014  . Chronic fatigue 07/24/2014  . Urinary dysfunction 07/24/2014  . Chronic insomnia 07/24/2014  . Depression with anxiety 07/24/2014   Laureen Abrahams, PT, DPT 09/03/2015 1:50 PM  [facility] [address] [place], [place], [address] Phone: [phone]   Fax:  [phone]  Name: Tonya Powers MRN: [id number] Date of Birth: July 27, 1960

## 2015-09-12 ENCOUNTER — Encounter: Payer: Self-pay | Admitting: Physical Therapy

## 2015-09-12 ENCOUNTER — Ambulatory Visit: Payer: Medicare Other | Admitting: Physical Therapy

## 2015-09-12 DIAGNOSIS — G35 Multiple sclerosis: Secondary | ICD-10-CM

## 2015-09-12 DIAGNOSIS — R29898 Other symptoms and signs involving the musculoskeletal system: Secondary | ICD-10-CM

## 2015-09-12 NOTE — Therapy (Addendum)
[facility] [address] [place], [place], [address] Phone: [phone]   Fax:  [phone]  Physical Therapy Treatment  Patient Details  Name: Tonya Powers MRN: [id number] Date of Birth: 11-May-1961 Referring Provider: Dr.Richard Sater  Encounter Date: 09/12/2015      PT End of Session - 09/12/15 1600    Visit Number 2   Number of Visits 9   Date for PT Re-Evaluation 10/29/15   PT Start Time 9024   PT Stop Time 1402   PT Time Calculation (min) 44 min   Equipment Utilized During Treatment Gait belt   Activity Tolerance Patient tolerated treatment well   Behavior During Therapy [facility] for tasks assessed/performed      Past Medical History  Diagnosis Date  . Multiple sclerosis ([place])   . Skin cancer   . History of breast biopsy   . PONV (postoperative nausea and vomiting)   . Anxiety   . Depression   . Neuromuscular disorder ([place])     MS  . Gait instability     uses wheelchair or assistance  . H/O steroid therapy     IV infusion every 6 to 8 weeks- last 05-31-14 [facility]    Past Surgical History  Procedure Laterality Date  . Leg surgery      sx as a child  . Tonsillectomy      as a child  . Partial hysterectomy    . Debridement skin / sq / muscle of arm Right 07/13/2003    I & D; debridement of tissue within triceps muscle  . Abdominal hysterectomy    . Melanoma excision    . Cesarean section    . Breast biopsy Right 09/20/2012    Procedure: BREAST BIOPSY WITH NEEDLE LOCALIZATION;  Surgeon: Rolm Bookbinder, MD;  Location: [facility];  Service: General;  Laterality: Right;  . Colonoscopy with propofol N/A 07/13/2014    Procedure: COLONOSCOPY WITH PROPOFOL;  Surgeon: Beryle Beams, MD;  [facility];  Service: Endoscopy;  Laterality: N/A;    There were no vitals filed for this visit.  Visit Diagnosis:  Multiple sclerosis ([place])  Weakness of both lower  extremities      Subjective Assessment - 09/12/15 1321    Subjective Started water classes two weeks ago 2x/week.    Pertinent History MS, anxiety, depression   How long can you walk comfortably? "not very far" points across room (~50')   Patient Stated Goals improve strength in legs; improve mobility; be able to walk into places by herself   Currently in Pain? Yes   Pain Score 4    Pain Location Back   Pain Orientation Lower   Pain Descriptors / Indicators Burning   Pain Type Chronic pain   Pain Onset More than a month ago   Multiple Pain Sites Yes   Pain Score 5   Pain Location Hip   Pain Orientation Right   Pain Descriptors / Indicators Penetrating   Pain Type Acute pain  from a more recent fall.   Pain Radiating Towards Down upper thigh   Pain Onset 1 to 4 weeks ago   Pain Frequency Intermittent   Aggravating Factors  certain movement   Pain Relieving Factors pain meds or sitting                                  PT Education -  09/12/15 1329    Education provided Yes   Education Details Performed and reviewed HEP for LE strengthening and standing balance and endurance, see handout for details.   Person(s) Educated Patient   Methods Explanation;Demonstration;Tactile cues;Verbal cues;Handout   Comprehension Verbalized understanding;Returned demonstration;Verbal cues required;Tactile cues required;Need further instruction          PT Short Term Goals - 09/03/15 1333    PT SHORT TERM GOAL #1   Title independent with HEP (10/01/15)   Time 4   Period Weeks   Status New   PT SHORT TERM GOAL #2   Title improve gait velocity to > 1.0 ft/sec for improved mobiltiy with RW (10/01/15)   Time 4   Period Weeks   Status New   PT SHORT TERM GOAL #3   Title improve timed up and go to < 35 sec with RW for improved mobiltiy (10/01/15)   Time 4   Period Weeks   Status New   PT SHORT TERM GOAL #4   Title stand with intermittent UE support x 3 min for  improved activity tolerance (10/01/15)   Time 4   Period Weeks   Status New           PT Long Term Goals - 09/03/15 1335    PT LONG TERM GOAL #1   Title verbalize understanding of fall prevention strategies for decreased fall risk (10/29/15)   Time 8   Period Weeks   Status New   PT LONG TERM GOAL #2   Title improve gait velocity to > 1.3 ft/sec for improved mobiltiy with RW (10/29/15)   Time 8   Period Weeks   Status New   PT LONG TERM GOAL #3   Title improve timed up and go to < 30 sec for improved mobility with RW (10/29/15)   Time 8   Period Weeks   Status New   PT LONG TERM GOAL #4   Title tolerate standing 5 min without UE support for improved function and ADLs (10/29/15)   Time 8   Period Weeks   Status New   PT LONG TERM GOAL #5   Title ambulate > 50' with LRAD and be able to fold device (rollator/RW) independently in order to walk into community buildings independently (10/29/15)   Time 8   Period Weeks   Status New       09/16/15: Late entry G-code Functional assessment tool used: clinical judgement Functional limitation: Mobility: Walking and moving around Goal status: CK Discharge Status: CL Laureen Abrahams, PT, DPT 09/16/2015 11:31 AM         Plan - 09/12/15 1355    Clinical Impression Statement Skiled session focused on LE strengthening in standing and standing balance with wt shifts while decreasing UE support. Required supervision and cues for posture/ technique; pt tends to hyperextend RLE and has difficulty fully extending LLE in standing. Required multiple seated rest breaks due to LE fatigue and for safety.   Pt will benefit from skilled therapeutic intervention in order to improve on the following deficits Abnormal gait;Decreased balance;Decreased activity tolerance;Decreased coordination;Pain;Decreased strength;Postural dysfunction;Decreased mobility;Decreased knowledge of use of DME;Difficulty walking;Impaired perceived functional  ability;Decreased safety awareness  will not directly address pain   Rehab Potential Good   PT Frequency 1x / week  recommend 2x/wk but pt requesting 1x/wk   PT Duration 8 weeks   PT Treatment/Interventions ADLs/Self Care Home Management;Cryotherapy;Electrical Stimulation;Moist Heat;Patient/family education;Neuromuscular re-education;Balance training;Therapeutic exercise;Therapeutic activities;Functional mobility training;Stair training;Gait training;Orthotic Fit/Training;Energy conservation  PT Next Visit Plan Review HEP for standing  balance/strengthening; gait with rollator/RW   Consulted and Agree with Plan of Care Patient        Problem List Patient Active Problem List   Diagnosis Date Noted  . Gastroenteritis 08/16/2014  . Nausea vomiting and diarrhea 08/16/2014  . Hypokalemia 08/16/2014  . Multiple sclerosis ([place]) 07/24/2014  . Ataxic gait 07/24/2014  . Chronic fatigue 07/24/2014  . Urinary dysfunction 07/24/2014  . Chronic insomnia 07/24/2014  . Depression with anxiety 07/24/2014    Bjorn Loser, PTA  09/12/2015, 4:06 PM [facility] [address] [place], [place], [address] Phone: [phone]   Fax:  [phone]  Name: Tonya Powers MRN: [id number] Date of Birth: [birth year]       PHYSICAL THERAPY DISCHARGE SUMMARY  Visits from Start of Care: 2  Current functional level related to goals / functional outcomes: See above   Remaining deficits: See above and initial evaluation; pt only came for 2 visits then canceled as she didn't feel therapy was effective.   Education / Equipment: HEP  Plan: Patient agrees to discharge.  Patient goals were not met. Patient is being discharged due to the patient's request.  ?????    Pt requesting d/c as she feels therapy isn't beneficial.  Pt only attended initial evaluation and 1 therapy treatment session.  Laureen Abrahams, PT, DPT 09/16/2015 11:29  AM  [facility] [address] [place], [place] [address]  [phone] (office) [phone] (fax)

## 2015-09-12 NOTE — Patient Instructions (Addendum)
Drop / Hold    Feet shoulder width apart, Hold onto sink. Lean forward. Bend knees and squat. Repeat _5__ times per set.  Do _2__ sets per session.  http://plyo.exer.us/69   Copyright  VHI. All rights reserved.   Do each exercise 1  times per day Do each exercise 10 repetitions x 2 Hold each exercise for 3 seconds to feel your location  AT [place].  Try to just hold onto the sink with one hand with chair behind you.  1. Side to Side Shift: Moving your hips only (not shoulders): move weight onto your left leg, HOLD/FEEL.  Move back to equal weight on each leg, HOLD/FEEL. Move weight onto your right leg, HOLD/FEEL. Move back to equal weight on each leg, HOLD/FEEL. Repeat. 2. Front to Back Shift: Moving your hips only (not shoulders): move your weight forward onto your toes, HOLD/FEEL. Move your weight back to equal Flat Foot on both legs, HOLD/FEEL. Move your weight back onto your heels, HOLD/FEEL. Move your weight back to equal on both legs, HOLD/FEEL. Repeat. 3. Moving Cones / Cups: ( Upper trunk rotation) With equal weight on each leg: Hold on with one hand the first time, then progress to no hand supports. Move cups from one side of sink to the other. Place cups ~2" out of your reach, progress to 10" beyond reach. 4. Overhead/Upward Reaching: alternated reaching up to top cabinets or ceiling if no cabinets present. Keep equal weight on each leg. Start with one hand support on counter while other hand reaches and progress to no hand support with reaching. 5.   Looking Over Shoulders: With equal weight on each leg: alternate turning to look          over your shoulders with one hand support on counter as needed. Shift weight to             side looking, pull hip then shoulder then head/eyes around to look behind you. Start       with one hand support & progress to no hand support.

## 2015-09-17 ENCOUNTER — Telehealth: Payer: Self-pay | Admitting: Neurology

## 2015-09-17 ENCOUNTER — Ambulatory Visit: Payer: Medicare Other | Admitting: Physical Therapy

## 2015-09-17 MED ORDER — TAPENTADOL HCL 50 MG PO TABS
50.0000 mg | ORAL_TABLET | Freq: Three times a day (TID) | ORAL | Status: DC | PRN
Start: 2015-09-17 — End: 2016-06-25

## 2015-09-17 MED ORDER — CLONAZEPAM 1 MG PO TABS: 1.0000 mg | ORAL_TABLET | Freq: Every evening | ORAL | Status: DC | PRN

## 2015-09-17 MED ORDER — TRAMADOL HCL 50 MG PO TABS: 50.0000 mg | ORAL_TABLET | Freq: Three times a day (TID) | ORAL | Status: DC | PRN

## 2015-09-17 NOTE — Telephone Encounter (Signed)
Rx's awaiting [facility] sig/fim 

## 2015-09-17 NOTE — Telephone Encounter (Signed)
Patient called to request renewal/refill of traMADol (ULTRAM) 50 MG tablet and clonazePAM (KLONOPIN) 1 MG tablet

## 2015-09-17 NOTE — Telephone Encounter (Signed)
Patient called back and also wants to order a written Rx nucynta 50 mg.  Thanks

## 2015-09-17 NOTE — Telephone Encounter (Signed)
Clonazepam, Tramadol and Nucynta rx's up front [facility]/fim

## 2015-09-24 ENCOUNTER — Ambulatory Visit: Payer: Medicare Other | Admitting: Physical Therapy

## 2015-09-26 ENCOUNTER — Ambulatory Visit: Payer: Medicare Other | Admitting: Physical Therapy

## 2015-10-01 ENCOUNTER — Ambulatory Visit: Payer: Medicare Other | Admitting: Physical Therapy

## 2015-10-01 ENCOUNTER — Encounter: Payer: Self-pay | Admitting: Neurology

## 2015-10-01 ENCOUNTER — Ambulatory Visit (INDEPENDENT_AMBULATORY_CARE_PROVIDER_SITE_OTHER): Payer: Medicare Other | Admitting: Neurology

## 2015-10-01 VITALS — BP 110/70 | HR 74 | Resp 14 | Ht 66.0 in | Wt 130.0 lb

## 2015-10-01 DIAGNOSIS — R260 Ataxic gait: Secondary | ICD-10-CM

## 2015-10-01 DIAGNOSIS — G35 Multiple sclerosis: Secondary | ICD-10-CM | POA: Diagnosis not present

## 2015-10-01 DIAGNOSIS — R5382 Chronic fatigue, unspecified: Secondary | ICD-10-CM | POA: Diagnosis not present

## 2015-10-01 DIAGNOSIS — F418 Other specified anxiety disorders: Secondary | ICD-10-CM | POA: Diagnosis not present

## 2015-10-01 DIAGNOSIS — G4700 Insomnia, unspecified: Secondary | ICD-10-CM | POA: Diagnosis not present

## 2015-10-01 DIAGNOSIS — R39198 Other difficulties with micturition: Secondary | ICD-10-CM

## 2015-10-01 DIAGNOSIS — Z79899 Other long term (current) drug therapy: Secondary | ICD-10-CM

## 2015-10-01 DIAGNOSIS — F5104 Psychophysiologic insomnia: Secondary | ICD-10-CM

## 2015-10-01 MED ORDER — ZOLPIDEM TARTRATE 10 MG PO TABS: 10.0000 mg | ORAL_TABLET | Freq: Every evening | ORAL | Status: DC | PRN

## 2015-10-01 MED ORDER — AMPHETAMINE-DEXTROAMPHETAMINE 20 MG PO TABS: 20.0000 mg | ORAL_TABLET | Freq: Two times a day (BID) | ORAL | Status: DC

## 2015-10-01 NOTE — Progress Notes (Addendum)
[facility]  PATIENT: Tonya Powers DOB: 02/27/1961  REFERRING CLINICIAN: Mayra Neer is PCP HISTORY FROM: Patient  REASON FOR VISIT: MS and poor gait   HISTORICAL  CHIEF COMPLAINT:  Chief Complaint  Patient presents with  . Multiple Sclerosis    Here today to discuss Ocrelizumab/fim    HISTORY OF PRESENT ILLNESS:  Tonya Powers is a 55 year old woman with MS.   She feels her MS is stable.     Her MS did well on Tysabri but she was JCV Ab positive and stopped.   She could not tolerate Tecfidera.   She has a relapsing form of SPMS  Gait/strength/sensation:    Gait is poor but essentially unchanged.  She uses a walker or an Transport planner in the house (walker at times) and wheelchair for longer distances .   She has ataxia in her legs and leg weakness.     She has numbness from the lower chest down and in her legs.  Sometimes, she has painful allodynia.      Her clothes feel like sandpaper from the bra level on down to her toes.     Lamotrigine is helping a little.  We discussed a higher dose.    Pain is around the hip and buttock regions and down her leg somewhat.  Spasticity is painful and she has dysesthesias.    Nucynta helps as much as opiate and is better tolerated (takes instead of tramadol at lunch).  She takes gabapentin 800 mg 3-4 a day.       Vision:  She denies MS related eye issues.  Bladder/bowel:  Bladder is poor with hesitancy > urgency.  She has no recent incontinences.    Taking desmopressin has greatly helped her nocturia and that helps her sleep better.  She reports severe constipation. Linzess did not help.    Nucynta does not worsen it.       Fatigue/sleep:   Fatigue is a daily problem.  She reports that her fatigue is more physical and cognitive.. This is helped best with Adderall and prn  Solu-Medrol infusions.   She has both sleep onset and sleep maintenance insomnia.   She takes clonazepam 1 mg and Ambien every night due to difficulty  falling and staying asleep. Clonazepam also helps her leg spasms as well as helps her to fall asleep.  Mood/cognition:   She notes depression and anxiety, but this is not worse than last year..  Her lack of independence depresses her.   Her depression is usually worse when she has more pain   She prefers not to take an antidepressant.    She rarely takes Xanax 0.5 mg when she has more anxiety. She notes more trouble with cognition, especially short term memory.    Adderall helps focus some  MS History:  She presented with optic neuritis in 1991 followed shortly by difficulties with leg weakness and by right trigeminal neuralgia. In retrospect, couple years earlier when she had her daughter, has some difficulties with her legs and needed to go on short-term disability. At first she was not diagnosed with MS but after more of the symptoms she underwent MRI testing and had a lumbar puncture by Dr. Johnnye Sima. The imaging and the CSF was consistent with multiple sclerosis. When Betaseron became available she was placed on that. She was on Betaseron for about 10 years. She felt that she did not have too many exacerbations during that time but she had a lot of  difficulty tolerating the Betaseron due to skin reactions. Around 12-15 years ago, she started to use a cane and she has had progressive gait disturbance over the last decade. Around the house for short distances, she uses a walker but uses her scooter for longer distances. Outside she uses a wheelchair pushed by others   REVIEW OF SYSTEMS:  Constitutional: No fevers, chills, sweats, or change in appetite.  She has Fatigue Eyes: No visual changes, double vision, eye pain Ear, nose and throat: No hearing loss, ear pain, nasal congestion, sore throat Cardiovascular: No chest pain, palpitations Respiratory:  No shortness of breath at rest or with exertion.   No wheezes GastrointestinaI: Mild dysphagia.  Constipation.  No nausea, vomiting, diarrhea.     Genitourinary:  No dysuria but has urinary retention and frequency.  Desmopressin helps nocturia. Musculoskeletal:  No neck pain,   Has lower back pain and buttocks.  Hips ans other joints ok Integumentary: No rash, pruritus, skin lesions Neurological: as above Psychiatric: see above Endocrine: No palpitations, diaphoresis, change in appetite, change in weigh or increased thirst Hematologic/Lymphatic:  No anemia, purpura, petechiae. Allergic/Immunologic: No itchy/runny eyes, nasal congestion, recent allergic reactions, rashes  ALLERGIES: Allergies  Allergen Reactions  . Codeine Nausea And Vomiting    HOME MEDICATIONS: Outpatient Prescriptions Prior to Visit  Medication Sig Dispense Refill  . acetaminophen (TYLENOL) 500 MG tablet Take 1,000 mg by mouth every 6 (six) hours as needed for headache (headache).    . ALPRAZolam (XANAX) 0.5 MG tablet Take 0.5 mg by mouth daily as needed for anxiety (anxiety).     Marland Kitchen amphetamine-dextroamphetamine (ADDERALL) 20 MG tablet Take 1 tablet (20 mg total) by mouth 2 (two) times daily. 60 tablet 0  . Cholecalciferol (VITAMIN D PO) Take 1 tablet by mouth every morning.    . clonazePAM (KLONOPIN) 1 MG tablet Take 1 tablet (1 mg total) by mouth at bedtime as needed for anxiety (sleep). 30 tablet 5  . desmopressin (DDAVP) 0.1 MG tablet Take 0.1 mg by mouth at bedtime as needed (bladder.).    Marland Kitchen diazepam (VALIUM) 5 MG tablet Take 1 tablet (5 mg total) by mouth every 6 (six) hours as needed for anxiety. 90 tablet 3  . doxepin (SINEQUAN) 10 MG capsule Take 1 capsule (10 mg total) by mouth at bedtime as needed. 30 capsule 3  . estradiol (CLIMARA - DOSED IN MG/24 HR) 0.075 mg/24hr patch Place 0.075 mg onto the skin once a week. Put on Saturdays.  10  . gabapentin (NEURONTIN) 800 MG tablet Take 1 tablet (800 mg total) by mouth 4 (four) times daily. 120 tablet 11  . ibuprofen (ADVIL,MOTRIN) 200 MG tablet Take 400 mg by mouth every 6 (six) hours as needed for headache  (headache).    . lamoTRIgine (LAMICTAL) 100 MG tablet Take 1 tablet (100 mg total) by mouth 2 (two) times daily. 60 tablet 11  . promethazine (PHENERGAN) 25 MG tablet Take 25 mg by mouth every 6 (six) hours as needed for nausea (nausea).     . tapentadol (NUCYNTA) 50 MG TABS tablet Take 1 tablet (50 mg total) by mouth 3 (three) times daily as needed for moderate pain or severe pain (pain). 90 tablet 0  . traMADol (ULTRAM) 50 MG tablet Take 1 tablet (50 mg total) by mouth 3 (three) times daily as needed for moderate pain (pain). 90 tablet 5  . zolpidem (AMBIEN) 10 MG tablet Take 1 tablet (10 mg total) by mouth at bedtime as needed for  sleep (sleep). 30 tablet 5  . baclofen (LIORESAL) 10 MG tablet Take 1 tablet (10 mg total) by mouth 3 (three) times daily. (Patient not taking: Reported on 10/01/2015) 905 each 5  . tamsulosin (FLOMAX) 0.4 MG CAPS capsule Take 1 capsule (0.4 mg total) by mouth daily. (Patient not taking: Reported on 10/01/2015) 30 capsule 11  . ciprofloxacin (CIPRO) 500 MG tablet Take 1 tablet (500 mg total) by mouth 2 (two) times daily. (Patient not taking: Reported on 09/03/2015) 14 tablet 0   No facility-administered medications prior to visit.    PAST MEDICAL HISTORY: Past Medical History  Diagnosis Date  . Multiple sclerosis ([place])   . Skin cancer   . History of breast biopsy   . PONV (postoperative nausea and vomiting)   . Anxiety   . Depression   . Neuromuscular disorder ([place])     MS  . Gait instability     uses wheelchair or assistance  . H/O steroid therapy     IV infusion every 6 to 8 weeks- last 05-31-14 [facility]    PAST SURGICAL HISTORY: Past Surgical History  Procedure Laterality Date  . Leg surgery      sx as a child  . Tonsillectomy      as a child  . Partial hysterectomy    . Debridement skin / sq / muscle of arm Right 07/13/2003    I & D; debridement of tissue within triceps muscle  . Abdominal hysterectomy    . Melanoma excision    .  Cesarean section    . Breast biopsy Right 09/20/2012    Procedure: BREAST BIOPSY WITH NEEDLE LOCALIZATION;  Surgeon: Rolm Bookbinder, MD;  Location: [facility];  Service: General;  Laterality: Right;  . Colonoscopy with propofol N/A 07/13/2014    Procedure: COLONOSCOPY WITH PROPOFOL;  Surgeon: Beryle Beams, MD;  Location: [facility];  Service: Endoscopy;  Laterality: N/A;    FAMILY HISTORY: Family History  Problem Relation Age of Onset  . Hyperlipidemia Mother   . Hypertension Mother   . Hypertension Father   . Diabetes type II Father     SOCIAL HISTORY:  Social History   Social History  . Marital Status: Married    Spouse Name: N/A  . Number of Children: N/A  . Years of Education: N/A   Occupational History  . Not on file.   Social History Main Topics  . Smoking status: Never Smoker   . Smokeless tobacco: Not on file  . Alcohol Use: Yes     Comment: social  . Drug Use: No  . Sexual Activity: Not on file   Other Topics Concern  . Not on file   Social History Narrative     PHYSICAL EXAM  Filed Vitals:   10/01/15 1311  BP: 110/70  Pulse: 74  Resp: 14  Height: 5\' 6"  (1.676 m)  Weight: 130 lb (58.968 kg)    Body mass index is 20.99 kg/(m^2).   General: The patient is well-developed and well-nourished and in no acute distress  Skin: Extremities are without significant edema.  Neurologic Exam  Mental status: The patient is alert and oriented x 3 at the time of the examination. The patient has apparent normal recent and remote memory, with an apparently normal attention span and concentration ability.   Speech is normal.  Cranial nerves: Extraocular movements are full. There is good facial sensation to soft touch bilaterally.Facial strength is normal.  Trapezius and sternocleidomastoid strength is  normal. No dysarthria is noted.  No obvious hearing deficits are noted.  Motor:  Muscle bulk and tone are normal. Strength is  3 / 5 in  proximal legs and 4/5 distally.    She uses arms to get out of chair  Sensory: Sensory testing is intact to pinprick, soft touch, vibration sensation, and position sense i arms but mild decreased touch in left leg.   .  Coordination: Cerebellar testing reveals reduced finger-nose-finger and poor heel-to-shin bilaterally.  Gait and station: She needs to use arms to stand.    Station is unsteady and gait requires bilateral support.  She can not tandem.     Reflexes: Deep tendon reflexes are symmetric and increased in legs bilaterally with spread at the knees.Nonsustained ankle clonus.    DIAGNOSTIC DATA (LABS, IMAGING, TESTING) - I reviewed patient records, labs, notes, testing and imaging myself where available.  Lab Results  Component Value Date   WBC 7.3 08/17/2014   HGB 12.5 08/17/2014   HCT 38.0 08/17/2014   MCV 94.5 08/17/2014   PLT 178 08/17/2014      Component Value Date/Time   NA 137 08/18/2014 0657   K 4.0 08/18/2014 0657   CL 105 08/18/2014 0657   CO2 28 08/18/2014 0657   GLUCOSE 91 08/18/2014 0657   BUN 7 08/18/2014 0657   CREATININE 0.43* 08/18/2014 0657   CALCIUM 8.1* 08/18/2014 0657   PROT 5.9* 08/17/2014 0540   ALBUMIN 3.2* 08/17/2014 0540   AST 18 08/17/2014 0540   ALT 13 08/17/2014 0540   ALKPHOS 44 08/17/2014 0540   BILITOT 0.6 08/17/2014 0540   GFRNONAA >90 08/18/2014 0657   GFRAA >90 08/18/2014 0657      ASSESSMENT AND PLAN  Multiple sclerosis ([facility])  Ataxic gait  Chronic fatigue  Chronic insomnia  Depression with anxiety  Urinary dysfunction   1.   Renew Adderall and Ambien.    2.   We discussed the pros and cons of starting ocrelizumab. She wishes to do so and signed the service request form. We will check labwork today. 3.   Continue other medications.  4.    She will return to see me in 4 months or sooner if she has new or worsening neurologic symptoms.  ADDENDUM:   Mrs. Skaja has a relapsing form of MS Elham Fini A. Felecia Shelling, MD,  PhD     Deniqua Perry A. Felecia Shelling, MD, PhD [id number], [id number] PM Certified in Neurology, Clinical Neurophysiology, Sleep Medicine, Pain Medicine and Neuroimaging  [facility] [address], [place], [place] [address] [phone]

## 2015-10-02 ENCOUNTER — Telehealth: Payer: Self-pay | Admitting: *Deleted

## 2015-10-02 LAB — CBC WITH DIFFERENTIAL/PLATELET
BASOS ABS: 0 10*3/uL (ref 0.0–0.2)
Basos: 0 %
EOS (ABSOLUTE): 0.1 10*3/uL (ref 0.0–0.4)
EOS: 2 %
Hematocrit: 38.4 % (ref 34.0–46.6)
Hemoglobin: 12.8 g/dL (ref 11.1–15.9)
IMMATURE GRANULOCYTES: 0 %
Immature Grans (Abs): 0 10*3/uL (ref 0.0–0.1)
Lymphocytes Absolute: 1.4 10*3/uL (ref 0.7–3.1)
Lymphs: 19 %
MCH: 31.1 pg (ref 26.6–33.0)
MCHC: 33.3 g/dL (ref 31.5–35.7)
MCV: 93 fL (ref 79–97)
Monocytes Absolute: 0.4 10*3/uL (ref 0.1–0.9)
Monocytes: 6 %
NEUTROS PCT: 73 %
Neutrophils Absolute: 5.4 10*3/uL (ref 1.4–7.0)
PLATELETS: 220 10*3/uL (ref 150–379)
RBC: 4.12 x10E6/uL (ref 3.77–5.28)
RDW: 13.4 % (ref 12.3–15.4)
WBC: 7.4 10*3/uL (ref 3.4–10.8)

## 2015-10-02 LAB — HEPATIC FUNCTION PANEL
ALK PHOS: 62 IU/L (ref 39–117)
ALT: 11 IU/L (ref 0–32)
AST: 11 IU/L (ref 0–40)
Albumin: 4.5 g/dL (ref 3.5–5.5)
Bilirubin Total: 0.4 mg/dL (ref 0.0–1.2)
Bilirubin, Direct: 0.11 mg/dL (ref 0.00–0.40)
TOTAL PROTEIN: 6.6 g/dL (ref 6.0–8.5)

## 2015-10-02 LAB — HEPATITIS B SURFACE ANTIGEN: Hepatitis B Surface Ag: NEGATIVE

## 2015-10-02 LAB — HEPATITIS B SURFACE ANTIBODY,QUALITATIVE: HEP B SURFACE AB, QUAL: NONREACTIVE

## 2015-10-02 LAB — HEPATITIS B CORE ANTIBODY, TOTAL: Hep B Core Total Ab: NEGATIVE

## 2015-10-02 NOTE — Telephone Encounter (Signed)
-----   Message from Britt Bottom, MD sent at 10/02/2015  8:54 AM EDT ----- Labs are fine. We consented in the ocrelizumab form.

## 2015-10-02 NOTE — Telephone Encounter (Signed)
I have spoken with Graci this afternoon and per RAS, advised labs are good; ok for Ocrelizumab if she still wants to start it.  She sts. she does.  Ocrelizumab start form started and turned in to [place] in the infusion suite/fim

## 2015-10-04 LAB — QUANTIFERON IN TUBE
QFT TB AG MINUS NIL VALUE: 0.14 [IU]/mL
QUANTIFERON MITOGEN VALUE: 6.3 [IU]/mL
QUANTIFERON NIL VALUE: 0.08 [IU]/mL
QUANTIFERON TB AG VALUE: 0.22 IU/mL
QUANTIFERON TB GOLD: NEGATIVE

## 2015-10-04 LAB — QUANTIFERON TB GOLD ASSAY (BLOOD)

## 2015-10-08 ENCOUNTER — Telehealth: Payer: Self-pay | Admitting: *Deleted

## 2015-10-08 NOTE — Telephone Encounter (Signed)
LMOM (identified vm) that per RAS, labwork was fine, so I have turned in her Ocrelizumab srf.  She does not need to return this call unless she  has questions/fim

## 2015-10-08 NOTE — Telephone Encounter (Signed)
-----   Message from Britt Bottom, MD sent at 10/04/2015  2:10 PM EDT ----- Labs are fine. We can send in the ocrelizumab enrollment form.

## 2015-10-09 ENCOUNTER — Telehealth: Payer: Self-pay | Admitting: Neurology

## 2015-10-09 DIAGNOSIS — G35 Multiple sclerosis: Secondary | ICD-10-CM | POA: Diagnosis not present

## 2015-10-09 NOTE — Telephone Encounter (Signed)
I have spoken with Tonya Powers is not sure who called or why.  She will research this and have someone call me back/fim

## 2015-10-09 NOTE — Telephone Encounter (Signed)
[facility] called. Please call  [phone] , Jari Pigg [phone]

## 2015-10-16 ENCOUNTER — Telehealth: Payer: Self-pay | Admitting: Neurology

## 2015-10-16 DIAGNOSIS — R39198 Other difficulties with micturition: Secondary | ICD-10-CM

## 2015-10-16 DIAGNOSIS — G35 Multiple sclerosis: Secondary | ICD-10-CM

## 2015-10-16 NOTE — Telephone Encounter (Signed)
Patient called, [place] advised her that she now by law needs a prescription for catheters, states she likes the 6 inch female catheters.

## 2015-10-17 ENCOUNTER — Telehealth: Payer: Self-pay | Admitting: Neurology

## 2015-10-17 MED ORDER — GABAPENTIN 800 MG PO TABS: 800.0000 mg | ORAL_TABLET | Freq: Four times a day (QID) | ORAL | Status: DC

## 2015-10-17 NOTE — Telephone Encounter (Signed)
Gabapentin escribed to [facility]/fim

## 2015-10-17 NOTE — Telephone Encounter (Signed)
Pt request refill for gabapentin (NEURONTIN) 800 MG tablet . RX has expired. Operator told her a refill note will be sent to RN, pt said she will call pharmacy also.

## 2015-10-17 NOTE — Telephone Encounter (Signed)
I have spoken with [facility].  They confirm that they must have a rx. for 14 fr. 6" catheters.  I have faxed a dme order to them at fax # [phone].  They will see if they can accept this--say that order must be on a rx. pad, but I have explained that our office does not use rx. pads; this is the only way I have of providing order for caths./fim

## 2015-10-28 ENCOUNTER — Telehealth: Payer: Self-pay | Admitting: Neurology

## 2015-10-28 NOTE — Telephone Encounter (Signed)
I have spoken with Tonya Powers this morning.  I have checked with Tonya Powers in the infusion suite--she does not have approval to schedule Ocrevus infusions yet.  Tonya Powers verbalized understanding of same/fim

## 2015-10-28 NOTE — Telephone Encounter (Signed)
Pt called in to ask for appt for infusion using new medication for MS . Please call pt [phone]

## 2015-11-01 ENCOUNTER — Telehealth: Payer: Self-pay | Admitting: Neurology

## 2015-11-01 ENCOUNTER — Telehealth: Payer: Self-pay | Admitting: *Deleted

## 2015-11-01 NOTE — Telephone Encounter (Signed)
I have spoken with Tonya Powers this morning.  She is getting a handicapped accessible [name] today and requests handicap placard application.  Form completed and given directly to [facility] husband in the office this morning. [name]

## 2015-11-01 NOTE — Telephone Encounter (Signed)
Jenny/[employer] [phone] REF# [id number] calling to schedule shipment of ocrevus

## 2015-11-01 NOTE — Telephone Encounter (Signed)
Pt called requesting a handicap sticker please call [phone].

## 2015-11-01 NOTE — Telephone Encounter (Signed)
Message printed and given to Tina in the infusion suite/fim 

## 2015-11-02 ENCOUNTER — Other Ambulatory Visit: Payer: Self-pay | Admitting: Neurology

## 2015-11-05 ENCOUNTER — Ambulatory Visit: Payer: Medicare Other | Admitting: Neurology

## 2015-11-06 DIAGNOSIS — G35 Multiple sclerosis: Secondary | ICD-10-CM | POA: Diagnosis not present

## 2015-11-15 ENCOUNTER — Telehealth: Payer: Self-pay | Admitting: *Deleted

## 2015-11-15 NOTE — Telephone Encounter (Signed)
I called and spoke to pt.  She had ocrevus week ago last Wednesday.  Noted last night increased sensitivity R groin radiating to waist, then back/butt area.  No rash.  No cramping.  Has rubber band feeling around feet.  ? Heat. SE of ocrevus?  Please advise.  [phone]

## 2015-11-15 NOTE — Telephone Encounter (Signed)
Caller [facility]               CID [id number]  Patient SAME                 Pt's Dr Felecia Shelling        Area Code [id number] MS/HAVING MAJOR SENSITIVITY    ISSUES/HAS ?'S /PCB                                  Disp:Y/N N If Y = C/B If No Response In 72minutes ============================================================

## 2015-11-18 NOTE — Telephone Encounter (Signed)
I have spoken with Tonya Powers who sts. discomfort has totally resolved.  Sts. she remembered she had her niece and nephew sitting on her lap a few days ago, right before sensitivity--and wonders if this was the cause.  She is sched. for Ocrevus day 15 tomorrow/fim

## 2015-11-18 NOTE — Telephone Encounter (Signed)
If the symptoms are still going on, we can bring her in for an additional dose of IV Solu-Medrol.

## 2015-11-19 DIAGNOSIS — G35 Multiple sclerosis: Secondary | ICD-10-CM | POA: Diagnosis not present

## 2015-12-06 ENCOUNTER — Other Ambulatory Visit: Payer: Self-pay | Admitting: Neurology

## 2015-12-06 NOTE — Telephone Encounter (Signed)
Requesting 90 day supply.

## 2016-01-08 ENCOUNTER — Other Ambulatory Visit: Payer: Self-pay | Admitting: Neurology

## 2016-01-09 ENCOUNTER — Telehealth: Payer: Self-pay | Admitting: Neurology

## 2016-01-09 NOTE — Telephone Encounter (Signed)
Patient requesting refill of doxepin (SINEQUAN) 10 MG capsule Pharmacy:[facility] [id number] - [place], [place]

## 2016-01-09 NOTE — Telephone Encounter (Signed)
Doxepin was escribed to [facility] on 12-06-15.  I have spoken with pharmacist there and confirmed they do have rx. on file/fim

## 2016-01-13 DIAGNOSIS — L570 Actinic keratosis: Secondary | ICD-10-CM | POA: Diagnosis not present

## 2016-01-13 DIAGNOSIS — Z85828 Personal history of other malignant neoplasm of skin: Secondary | ICD-10-CM | POA: Diagnosis not present

## 2016-01-13 DIAGNOSIS — D485 Neoplasm of uncertain behavior of skin: Secondary | ICD-10-CM | POA: Diagnosis not present

## 2016-01-30 ENCOUNTER — Encounter: Payer: Self-pay | Admitting: Neurology

## 2016-01-30 ENCOUNTER — Ambulatory Visit (INDEPENDENT_AMBULATORY_CARE_PROVIDER_SITE_OTHER): Payer: Medicare Other | Admitting: Neurology

## 2016-01-30 VITALS — BP 120/70 | HR 75 | Ht 65.0 in | Wt 137.5 lb

## 2016-01-30 DIAGNOSIS — G4700 Insomnia, unspecified: Secondary | ICD-10-CM

## 2016-01-30 DIAGNOSIS — F418 Other specified anxiety disorders: Secondary | ICD-10-CM

## 2016-01-30 DIAGNOSIS — R260 Ataxic gait: Secondary | ICD-10-CM | POA: Diagnosis not present

## 2016-01-30 DIAGNOSIS — R5382 Chronic fatigue, unspecified: Secondary | ICD-10-CM | POA: Diagnosis not present

## 2016-01-30 DIAGNOSIS — R39198 Other difficulties with micturition: Secondary | ICD-10-CM

## 2016-01-30 DIAGNOSIS — G35 Multiple sclerosis: Secondary | ICD-10-CM

## 2016-01-30 DIAGNOSIS — R600 Localized edema: Secondary | ICD-10-CM

## 2016-01-30 DIAGNOSIS — R208 Other disturbances of skin sensation: Secondary | ICD-10-CM

## 2016-01-30 DIAGNOSIS — F5104 Psychophysiologic insomnia: Secondary | ICD-10-CM

## 2016-01-30 MED ORDER — DESMOPRESSIN ACETATE 0.1 MG PO TABS: 0.1000 mg | ORAL_TABLET | Freq: Every evening | ORAL | 11 refills | Status: DC | PRN

## 2016-01-30 MED ORDER — LAMOTRIGINE 200 MG PO TABS: 200.0000 mg | ORAL_TABLET | Freq: Two times a day (BID) | ORAL | 11 refills | Status: DC

## 2016-01-30 MED ORDER — AMPHETAMINE-DEXTROAMPHETAMINE 20 MG PO TABS: 20.0000 mg | ORAL_TABLET | Freq: Two times a day (BID) | ORAL | 0 refills | Status: DC

## 2016-01-30 MED ORDER — ZOLPIDEM TARTRATE 10 MG PO TABS: 10.0000 mg | ORAL_TABLET | Freq: Every evening | ORAL | 5 refills | Status: DC | PRN

## 2016-01-30 NOTE — Progress Notes (Signed)
[facility]  PATIENT: Tonya Powers DOB: 03-29-1961  REFERRING CLINICIAN: Mayra Neer is PCP HISTORY FROM: Patient  REASON FOR VISIT: MS and poor gait   HISTORICAL  CHIEF COMPLAINT:  Chief Complaint  Patient presents with  . Follow-up    RM 13,alone. Last seen 10/01/15. Has some questions    HISTORY OF PRESENT ILLNESS:  Tonya Powers is a 55 year old woman with MS.   She feels her MS is stable.     She was not having exacerbations on Tysabri and was more stable but she was JCV Ab positive and stopped.   She could not tolerate Tecfidera.   She has a relapsing form of SPMS    She had Ocrelizumab in May / June and tolerated the infusions well.    She feels fatigue is slightly better now than on nothing or Tecfidera.      [facility]     Gait/strength/sensation:    Gait is poor due to weakness/spasticity and poor balance.  She uses a walker or an Transport planner in the house (walker at times) and wheelchair for longer distances .     She also has numbness from the lower chest down and in her legs.  Sometimes, she has painful allodynia with a sandpaper sensation.  Lamotrigine and gabapentin is helping.   Bras and tight clothes are worse.      Voice:   She notes some throat pain and mild change in voice quality.       Vision:  She denies MS related eye issues.  Bladder/bowel:  Bladder is poor with hesitancy > urgency.  She has no recent incontinences.    Taking desmopressin has greatly helped her nocturia and that helps her sleep better.  She reports severe constipation. Linzess did not help.    Nucynta does not worsen it.       Fatigue/sleep:   Fatigue is a daily problem.  She reports that her fatigue is more physical and cognitive.. This is helped best with Adderall and prn  Solu-Medrol infusions.   She has both sleep onset and sleep maintenance insomnia.   She takes clonazepam 1 mg and Ambien every night due to difficulty falling and  staying asleep. Clonazepam also helps her leg spasms as well as helps her to fall asleep.  Mood/cognition:   She notes mild depression and anxiety.  Her lack of independence depresses her but she remains active and tries to get out of th house most days..   Her depression is usually worse when she has more pain   She prefers not to take an antidepressant.    She rarely takes Xanax 0.5 mg when she has more anxiety. She notes more trouble with cognition, especially short term memory.    Adderall helps focus some  Edema:   She has had swelling  At hr ankles, worse as the day goes on.      Pain:   Muscular/joint pain is around the right flank and the hip and buttock regions and down her leg somewhat.  Spasticity is painful and she has dysesthesias.    Nucynta helps as much as opiate and is better tolerated (takes instead of tramadol at lunch).  She takes gabapentin 800 mg 3-4 a day.        MS History:  She presented with optic neuritis in 1991 followed shortly by difficulties with leg weakness and by right trigeminal neuralgia. In retrospect, couple years  earlier when she had her daughter, has some difficulties with her legs and needed to go on short-term disability. At first she was not diagnosed with MS but after more of the symptoms she underwent MRI testing and had a lumbar puncture by Dr. Johnnye Sima. The imaging and the CSF was consistent with multiple sclerosis. When Betaseron became available she was placed on that. She was on Betaseron for about 10 years. She felt that she did not have too many exacerbations during that time but she had a lot of difficulty tolerating the Betaseron due to skin reactions. Around 12-15 years ago, she started to use a cane and she has had progressive gait disturbance over the last decade. Around the house for short distances, she uses a walker but uses her scooter for longer distances. Outside she uses a wheelchair pushed by others   REVIEW OF SYSTEMS:  Constitutional: No  fevers, chills, sweats, or change in appetite.  She has Fatigue Eyes: No visual changes, double vision, eye pain Ear, nose and throat: No hearing loss, ear pain, nasal congestion, sore throat Cardiovascular: No chest pain, palpitations Respiratory:  No shortness of breath at rest or with exertion.   No wheezes GastrointestinaI: Mild dysphagia.  Constipation.  No nausea, vomiting, diarrhea.   Genitourinary:  No dysuria but has urinary retention and frequency.  Desmopressin helps nocturia. Musculoskeletal:  No neck pain,   Has lower back pain and buttocks.  Hips ans other joints ok Integumentary: No rash, pruritus, skin lesions Neurological: as above Psychiatric: see above Endocrine: No palpitations, diaphoresis, change in appetite, change in weigh or increased thirst Hematologic/Lymphatic:  No anemia, purpura, petechiae. Allergic/Immunologic: No itchy/runny eyes, nasal congestion, recent allergic reactions, rashes  ALLERGIES: Allergies  Allergen Reactions  . Codeine Nausea And Vomiting    HOME MEDICATIONS: Outpatient Medications Prior to Visit  Medication Sig Dispense Refill  . acetaminophen (TYLENOL) 500 MG tablet Take 1,000 mg by mouth every 6 (six) hours as needed for headache (headache).    . ALPRAZolam (XANAX) 0.5 MG tablet Take 0.5 mg by mouth daily as needed for anxiety (anxiety).     . Cholecalciferol (VITAMIN D PO) Take 1 tablet by mouth every morning.    . clonazePAM (KLONOPIN) 1 MG tablet Take 1 tablet (1 mg total) by mouth at bedtime as needed for anxiety (sleep). 30 tablet 5  . diazepam (VALIUM) 5 MG tablet TAKE 1 TABLET BY MOUTH EVERY 6 HOURS AS NEEDED FOR ANXIETY 90 tablet 3  . doxepin (SINEQUAN) 10 MG capsule TAKE 1 CAPSULE(10 MG) BY MOUTH AT BEDTIME AS NEEDED 90 capsule 3  . estradiol (CLIMARA - DOSED IN MG/24 HR) 0.075 mg/24hr patch Place 0.075 mg onto the skin once a week. Put on Saturdays.  10  . gabapentin (NEURONTIN) 800 MG tablet Take 1 tablet (800 mg total) by  mouth 4 (four) times daily. 120 tablet 11  . ibuprofen (ADVIL,MOTRIN) 200 MG tablet Take 400 mg by mouth every 6 (six) hours as needed for headache (headache).    . promethazine (PHENERGAN) 25 MG tablet Take 25 mg by mouth every 6 (six) hours as needed for nausea (nausea).     . tapentadol (NUCYNTA) 50 MG TABS tablet Take 1 tablet (50 mg total) by mouth 3 (three) times daily as needed for moderate pain or severe pain (pain). 90 tablet 0  . traMADol (ULTRAM) 50 MG tablet Take 1 tablet (50 mg total) by mouth 3 (three) times daily as needed for moderate pain (pain). 90 tablet  5  . amphetamine-dextroamphetamine (ADDERALL) 20 MG tablet Take 1 tablet (20 mg total) by mouth 2 (two) times daily. 60 tablet 0  . desmopressin (DDAVP) 0.1 MG tablet Take 0.1 mg by mouth at bedtime as needed (bladder.).    Marland Kitchen lamoTRIgine (LAMICTAL) 100 MG tablet Take 1 tablet (100 mg total) by mouth 2 (two) times daily. 60 tablet 11  . zolpidem (AMBIEN) 10 MG tablet Take 1 tablet (10 mg total) by mouth at bedtime as needed for sleep (sleep). 30 tablet 5  . baclofen (LIORESAL) 10 MG tablet Take 1 tablet (10 mg total) by mouth 3 (three) times daily. (Patient not taking: Reported on 10/01/2015) 905 each 5  . tamsulosin (FLOMAX) 0.4 MG CAPS capsule Take 1 capsule (0.4 mg total) by mouth daily. (Patient not taking: Reported on 10/01/2015) 30 capsule 11   No facility-administered medications prior to visit.     PAST MEDICAL HISTORY: Past Medical History:  Diagnosis Date  . Anxiety   . Depression   . Gait instability    uses wheelchair or assistance  . H/O steroid therapy    IV infusion every 6 to 8 weeks- last 05-31-14 [facility]  . History of breast biopsy   . Multiple sclerosis ([place])   . Neuromuscular disorder ([place])    MS  . PONV (postoperative nausea and vomiting)   . Skin cancer     PAST SURGICAL HISTORY: Past Surgical History:  Procedure Laterality Date  . ABDOMINAL HYSTERECTOMY    . BREAST BIOPSY Right  09/20/2012   Procedure: BREAST BIOPSY WITH NEEDLE LOCALIZATION;  Surgeon: Rolm Bookbinder, MD;  Location: [facility];  Service: General;  Laterality: Right;  . CESAREAN SECTION    . COLONOSCOPY WITH PROPOFOL N/A 07/13/2014   Procedure: COLONOSCOPY WITH PROPOFOL;  Surgeon: Beryle Beams, MD;  Location: [facility];  Service: Endoscopy;  Laterality: N/A;  . DEBRIDEMENT SKIN / SQ / MUSCLE OF ARM Right 07/13/2003   I & D; debridement of tissue within triceps muscle  . LEG SURGERY     sx as a child  . MELANOMA EXCISION    . PARTIAL HYSTERECTOMY    . TONSILLECTOMY     as a child    FAMILY HISTORY: Family History  Problem Relation Age of Onset  . Hyperlipidemia Mother   . Hypertension Mother   . Hypertension Father   . Diabetes type II Father     SOCIAL HISTORY:  Social History   Social History  . Marital status: Married    Spouse name: N/A  . Number of children: N/A  . Years of education: N/A   Occupational History  . Not on file.   Social History Main Topics  . Smoking status: Never Smoker  . Smokeless tobacco: Not on file  . Alcohol use Yes     Comment: social  . Drug use: No  . Sexual activity: Not on file   Other Topics Concern  . Not on file   Social History Narrative  . No narrative on file     PHYSICAL EXAM  Vitals:   01/30/16 1408  BP: 120/70  Pulse: 75  Weight: 137 lb 8 oz (62.4 kg)  Height: 5\' 5"  (1.651 m)    Body mass index is 22.88 kg/m.   General: The patient is well-developed and well-nourished and in no acute distress  Skin/Ext: Extremities show mild pedal edema.   No rashes.   Symm pulses.  No pain on palpation  Musculoskeletal:  She is tender over the piriformis muscles, right greater than left in the lower lumbar paraspinal muscles, right greater than left.  Mildly reduced range of motion and back.  Neurologic Exam  Mental status: The patient is alert and oriented x 3 at the time of the examination. The patient has  apparent normal recent and remote memory, with an apparently normal attention span and concentration ability.   Speech is normal.  Cranial nerves: Extraocular movements are full. There is good facial sensation to soft touch bilaterally.Facial strength is normal.  Trapezius and sternocleidomastoid strength is normal. No dysarthria is noted.  No obvious hearing deficits are noted.  Motor:  Muscle bulk and tone are normal. Strength is  3 / 5 in proximal legs and 4/5 distally. Left and right arm are similar.    She uses arms to get out of chair  Sensory: Sensory testing is intact to soft touch, vibration sensation in arms but mild decreased touch in left leg.   .  Coordination: Cerebellar testing reveals reduced finger-nose-finger and poor heel-to-shin bilaterally.  Gait and station: She needs to use arms to stand.    Station is unsteady and gait requires bilateral support.  She can not tandem.     Reflexes: Deep tendon reflexes are symmetric and increased in legs bilaterally with spread at the knees.Nonsustained ankle clonus bilaterally.    DIAGNOSTIC DATA (LABS, IMAGING, TESTING) - I reviewed patient records, labs, notes, testing and imaging myself where available.  Lab Results  Component Value Date   WBC 7.4 10/01/2015   HGB 12.5 08/17/2014   HCT 38.4 10/01/2015   MCV 93 10/01/2015   PLT 220 10/01/2015      Component Value Date/Time   NA 137 08/18/2014 0657   K 4.0 08/18/2014 0657   CL 105 08/18/2014 0657   CO2 28 08/18/2014 0657   GLUCOSE 91 08/18/2014 0657   BUN 7 08/18/2014 0657   CREATININE 0.43 (L) 08/18/2014 0657   CALCIUM 8.1 (L) 08/18/2014 0657   PROT 6.6 10/01/2015 1351   ALBUMIN 4.5 10/01/2015 1351   AST 11 10/01/2015 1351   ALT 11 10/01/2015 1351   ALKPHOS 62 10/01/2015 1351   BILITOT 0.4 10/01/2015 1351   GFRNONAA >90 08/18/2014 0657   GFRAA >90 08/18/2014 0657      ASSESSMENT AND PLAN  Multiple sclerosis ([facility])  Ataxic gait  Chronic fatigue  Chronic  insomnia  Depression with anxiety  Urinary dysfunction  Pedal edema  Dysesthesia   1.  Continue ocrelizumab, next infusion is November..  2.   Refill Adderall and Ambien.  3.   For the dysesthesias we will increase the lamotrigine to 200 mg twice a day. Continue other medications.  4.    She will return to see me in 4 months or sooner if she has new or worsening neurologic symptoms.    Brighid Koch A. Felecia Shelling, MD, PhD [id number], 0000000 PM Certified in Neurology, Clinical Neurophysiology, Sleep Medicine, Pain Medicine and Neuroimaging  [facility] [address] [place], [place] [address] [phone] [

## 2016-02-05 ENCOUNTER — Telehealth: Payer: Self-pay | Admitting: Neurology

## 2016-02-05 NOTE — Telephone Encounter (Signed)
I have spoken with Anaelle who c/o increased fatigue and requests IV SM.  Per RAS, ok for SM 1gm IV once.  After speaking with Otila Kluver in the infusion suite, appt. given for 3pm tomorrow.  Orders given to Tina/fim

## 2016-02-05 NOTE — Telephone Encounter (Signed)
Patient  is calling. She is not doing good at all and wants to know if she can come and get an infusion. Please call and advise.

## 2016-02-06 DIAGNOSIS — F411 Generalized anxiety disorder: Secondary | ICD-10-CM | POA: Diagnosis not present

## 2016-02-06 DIAGNOSIS — R0981 Nasal congestion: Secondary | ICD-10-CM | POA: Diagnosis not present

## 2016-02-06 DIAGNOSIS — G35 Multiple sclerosis: Secondary | ICD-10-CM | POA: Diagnosis not present

## 2016-02-06 DIAGNOSIS — R8290 Unspecified abnormal findings in urine: Secondary | ICD-10-CM | POA: Diagnosis not present

## 2016-02-07 DIAGNOSIS — G35 Multiple sclerosis: Secondary | ICD-10-CM | POA: Diagnosis not present

## 2016-02-24 ENCOUNTER — Ambulatory Visit (INDEPENDENT_AMBULATORY_CARE_PROVIDER_SITE_OTHER): Payer: Medicare Other | Admitting: Neurology

## 2016-02-24 ENCOUNTER — Telehealth: Payer: Self-pay | Admitting: Neurology

## 2016-02-24 ENCOUNTER — Encounter: Payer: Self-pay | Admitting: Neurology

## 2016-02-24 VITALS — BP 132/78 | HR 76 | Resp 16 | Ht 65.0 in | Wt 136.0 lb

## 2016-02-24 DIAGNOSIS — R208 Other disturbances of skin sensation: Secondary | ICD-10-CM

## 2016-02-24 DIAGNOSIS — R600 Localized edema: Secondary | ICD-10-CM

## 2016-02-24 DIAGNOSIS — R39198 Other difficulties with micturition: Secondary | ICD-10-CM | POA: Diagnosis not present

## 2016-02-24 DIAGNOSIS — R260 Ataxic gait: Secondary | ICD-10-CM

## 2016-02-24 DIAGNOSIS — G35 Multiple sclerosis: Secondary | ICD-10-CM | POA: Diagnosis not present

## 2016-02-24 DIAGNOSIS — R5382 Chronic fatigue, unspecified: Secondary | ICD-10-CM

## 2016-02-24 MED ORDER — AMPHETAMINE-DEXTROAMPHETAMINE 20 MG PO TABS: 20.0000 mg | ORAL_TABLET | Freq: Two times a day (BID) | ORAL | 0 refills | Status: DC

## 2016-02-24 MED ORDER — LAMOTRIGINE 200 MG PO TABS: 200.0000 mg | ORAL_TABLET | Freq: Three times a day (TID) | ORAL | 11 refills | Status: DC

## 2016-02-24 MED ORDER — FUROSEMIDE 20 MG PO TABS: ORAL_TABLET | ORAL | 11 refills | Status: DC

## 2016-02-24 NOTE — Progress Notes (Signed)
[facility]  PATIENT: Tonya Powers DOB: 1960-08-31  REFERRING CLINICIAN: Mayra Neer is PCP HISTORY FROM: Patient  REASON FOR VISIT: MS and poor gait   HISTORICAL  CHIEF COMPLAINT:  Chief Complaint  Patient presents with  . Multiple Sclerosis    Sts. she is having more fatigue, more difficulty with balance over the last few weeks. Worsening edema bilat feet/ankles.  Elevation helps some/fim    HISTORY OF PRESENT ILLNESS:  Tonya Powers is a 55 year old woman with MS. She was on Tysabri and was more stable but converted to JCV Ab positive and stopped.   She could not tolerate Tecfidera.    She had Ocrelizumab in May / June and tolerated the infusions well.  Over the last couple weeks, she is more off balanced an notes worsening foot/ankle edema.    Gait/strength/sensation:    She is noting more trouble walking, transferring and using her walker.  Two steroid doses IV did not help.    She falls almost every day while using walker or transferring.  She has left > right leg weakness and spasticity.   She uses an Transport planner in the house (and walker a few times a day for very short distances) .     She has numbness and dysesthesias from the lower chest down and in her legs.  The painful allodynia is a sandpaper sensation.  Right hand also sometimes dysesthetic.  Lamotrigine and gabapentin help a little bit.   Bras and tight clothes are worse.      Ampyra had not helped in the past.    Baclofen made her weaker and very sleepy.     She has done PT a few times in the past but never felt she got a benefit.      Edema:   She has more swelling in her ankles, worse as the day goes on.    She also notes her feet are more red.  Bladder/bowel:  Bladder is poor with hesitancy > urgency.  She has to wear pads and has incontinence at night.    Taking desmopressin has helped her nocturia some (gets 5 straight hours sleep many nights).  She reports severe constipation. Linzess  did not help.    Nucynta does not worsen it.       Fatigue/sleep:   Fatigue is moderate many days.   Adderall helps some.   When more severe prn  Solu-Medrol infusions also help.   She has both sleep onset and sleep maintenance insomnia.   She takes clonazepam 1 mg and Ambien every night due to difficulty falling and staying asleep. Clonazepam also helps her leg spasms as well as helps her to fall asleep.  Mood/cognition:   She notes mild depression and anxiety.  Her lack of independence depresses her but she remains active and tries to get out of th house most days..   Her depression is usually worse when she has more pain   She prefers not to take an antidepressant.    She rarely takes Xanax 0.5 mg when she has more anxiety. She notes more trouble with cognition, especially short term memory.    Adderall helps focus some  Pain:   She reports pain around the right flank and the hip and buttock regions and down her leg somewhat.  Spasticity is painful and she has dysesthesias.    Nucynta helps as much as opiate and is better tolerated with less constipation (takes instead of tramadol at lunch).  She takes gabapentin 800 mg 3-4 a day.        MS History:  She presented with optic neuritis in 1991 followed shortly by difficulties with leg weakness and by right trigeminal neuralgia. In retrospect, couple years earlier when she had her daughter, has some difficulties with her legs and needed to go on short-term disability. At first she was not diagnosed with MS but after more of the symptoms she underwent MRI testing and had a lumbar puncture by Dr. Johnnye Sima. The imaging and the CSF was consistent with multiple sclerosis. When Betaseron became available she was placed on that. She was on Betaseron for about 10 years. She felt that she did not have too many exacerbations during that time but she had a lot of difficulty tolerating the Betaseron due to skin reactions. Around 12-15 years ago, she started to use a  cane and she has had progressive gait disturbance over the last decade. Around the house for short distances, she uses a walker but uses her scooter for longer distances. Outside she uses a wheelchair pushed by others   REVIEW OF SYSTEMS:  Constitutional: No fevers, chills, sweats, or change in appetite.  She has Fatigue Eyes: No visual changes, double vision, eye pain Ear, nose and throat: No hearing loss, ear pain, nasal congestion, sore throat Cardiovascular: No chest pain, palpitations Respiratory:  No shortness of breath at rest or with exertion.   No wheezes GastrointestinaI: Mild dysphagia.  Constipation.  No nausea, vomiting, diarrhea.   Genitourinary:  No dysuria but has urinary retention and frequency.  Desmopressin helps nocturia. Musculoskeletal:  No neck pain,   Has lower back pain and buttocks.  Hips ans other joints ok Integumentary: No rash, pruritus, skin lesions Neurological: as above Psychiatric: see above Endocrine: No palpitations, diaphoresis, change in appetite, change in weigh or increased thirst Hematologic/Lymphatic:  No anemia, purpura, petechiae. Allergic/Immunologic: No itchy/runny eyes, nasal congestion, recent allergic reactions, rashes  ALLERGIES: Allergies  Allergen Reactions  . Codeine Nausea And Vomiting    HOME MEDICATIONS: Outpatient Medications Prior to Visit  Medication Sig Dispense Refill  . acetaminophen (TYLENOL) 500 MG tablet Take 1,000 mg by mouth every 6 (six) hours as needed for headache (headache).    . ALPRAZolam (XANAX) 0.5 MG tablet Take 0.5 mg by mouth daily as needed for anxiety (anxiety).     . Cholecalciferol (VITAMIN D PO) Take 1 tablet by mouth every morning.    . clonazePAM (KLONOPIN) 1 MG tablet Take 1 tablet (1 mg total) by mouth at bedtime as needed for anxiety (sleep). 30 tablet 5  . desmopressin (DDAVP) 0.1 MG tablet Take 1 tablet (0.1 mg total) by mouth at bedtime as needed (bladder.). 30 tablet 11  . diazepam (VALIUM) 5  MG tablet TAKE 1 TABLET BY MOUTH EVERY 6 HOURS AS NEEDED FOR ANXIETY 90 tablet 3  . doxepin (SINEQUAN) 10 MG capsule TAKE 1 CAPSULE(10 MG) BY MOUTH AT BEDTIME AS NEEDED 90 capsule 3  . estradiol (CLIMARA - DOSED IN MG/24 HR) 0.075 mg/24hr patch Place 0.075 mg onto the skin once a week. Put on Saturdays.  10  . gabapentin (NEURONTIN) 800 MG tablet Take 1 tablet (800 mg total) by mouth 4 (four) times daily. 120 tablet 11  . ibuprofen (ADVIL,MOTRIN) 200 MG tablet Take 400 mg by mouth every 6 (six) hours as needed for headache (headache).    . promethazine (PHENERGAN) 25 MG tablet Take 25 mg by mouth every 6 (six) hours as needed for  nausea (nausea).     . tapentadol (NUCYNTA) 50 MG TABS tablet Take 1 tablet (50 mg total) by mouth 3 (three) times daily as needed for moderate pain or severe pain (pain). 90 tablet 0  . traMADol (ULTRAM) 50 MG tablet Take 1 tablet (50 mg total) by mouth 3 (three) times daily as needed for moderate pain (pain). 90 tablet 5  . zolpidem (AMBIEN) 10 MG tablet Take 1 tablet (10 mg total) by mouth at bedtime as needed for sleep (sleep). 30 tablet 5  . amphetamine-dextroamphetamine (ADDERALL) 20 MG tablet Take 1 tablet (20 mg total) by mouth 2 (two) times daily. 60 tablet 0  . lamoTRIgine (LAMICTAL) 200 MG tablet Take 1 tablet (200 mg total) by mouth 2 (two) times daily. 60 tablet 11   No facility-administered medications prior to visit.     PAST MEDICAL HISTORY: Past Medical History:  Diagnosis Date  . Anxiety   . Depression   . Gait instability    uses wheelchair or assistance  . H/O steroid therapy    IV infusion every 6 to 8 weeks- last 05-31-14 [facility]  . History of breast biopsy   . Multiple sclerosis ([place])   . Neuromuscular disorder ([place])    MS  . PONV (postoperative nausea and vomiting)   . Skin cancer     PAST SURGICAL HISTORY: Past Surgical History:  Procedure Laterality Date  . ABDOMINAL HYSTERECTOMY    . BREAST BIOPSY Right 09/20/2012    Procedure: BREAST BIOPSY WITH NEEDLE LOCALIZATION;  Surgeon: Rolm Bookbinder, MD;  Location: [facility];  Service: General;  Laterality: Right;  . CESAREAN SECTION    . COLONOSCOPY WITH PROPOFOL N/A 07/13/2014   Procedure: COLONOSCOPY WITH PROPOFOL;  Surgeon: Beryle Beams, MD;  Location: [facility];  Service: Endoscopy;  Laterality: N/A;  . DEBRIDEMENT SKIN / SQ / MUSCLE OF ARM Right 07/13/2003   I & D; debridement of tissue within triceps muscle  . LEG SURGERY     sx as a child  . MELANOMA EXCISION    . PARTIAL HYSTERECTOMY    . TONSILLECTOMY     as a child    FAMILY HISTORY: Family History  Problem Relation Age of Onset  . Hyperlipidemia Mother   . Hypertension Mother   . Hypertension Father   . Diabetes type II Father     SOCIAL HISTORY:  Social History   Social History  . Marital status: Married    Spouse name: N/A  . Number of children: N/A  . Years of education: N/A   Occupational History  . Not on file.   Social History Main Topics  . Smoking status: Never Smoker  . Smokeless tobacco: Not on file  . Alcohol use Yes     Comment: social  . Drug use: No  . Sexual activity: Not on file   Other Topics Concern  . Not on file   Social History Narrative  . No narrative on file     PHYSICAL EXAM  Vitals:   02/24/16 1114  BP: 132/78  Pulse: 76  Resp: 16  Weight: 136 lb (61.7 kg)  Height: 5\' 5"  (1.651 m)    Body mass index is 22.63 kg/m.   General: The patient is well-developed and well-nourished and in no acute distress  Skin/Ext: Extremities show moderate pedal edema and feet red.   No rashes.   Symm pulses.  No calf pain on palpation  Neurologic Exam  Mental status: The  patient is alert and oriented x 3 at the time of the examination. The patient has apparent normal recent and remote memory, with an apparently normal attention span and concentration ability.   Speech is normal.  Cranial nerves: Extraocular movements are  full. There is good facial sensation to soft touch bilaterally.Facial strength is normal.  Trapezius and sternocleidomastoid strength is normal. No dysarthria is noted.  No obvious hearing deficits are noted.  Motor:  Muscle bulk and tone are normal. Strength is  3 / 5 in proximal legs and 4/5 distally. Left and right arm are similar.    She uses arms to get out of chair  Sensory: Sensory testing is intact to soft touch, vibration sensation in arms but mild decreased touch in left leg.   .  Coordination: Cerebellar testing reveals reduced finger-nose-finger and poor heel-to-shin bilaterally.  Gait and station: She needs to use arms to stand.    Station is unsteady and gait requires bilateral support.  She can not tandem.     Reflexes: Deep tendon reflexes are symmetric and increased in legs bilaterally with spread at the knees.Nonsustained ankle clonus bilaterally.    DIAGNOSTIC DATA (LABS, IMAGING, TESTING) - I reviewed patient records, labs, notes, testing and imaging myself where available.  Lab Results  Component Value Date   WBC 7.4 10/01/2015   HGB 12.5 08/17/2014   HCT 38.4 10/01/2015   MCV 93 10/01/2015   PLT 220 10/01/2015      Component Value Date/Time   NA 137 08/18/2014 0657   K 4.0 08/18/2014 0657   CL 105 08/18/2014 0657   CO2 28 08/18/2014 0657   GLUCOSE 91 08/18/2014 0657   BUN 7 08/18/2014 0657   CREATININE 0.43 (L) 08/18/2014 0657   CALCIUM 8.1 (L) 08/18/2014 0657   PROT 6.6 10/01/2015 1351   ALBUMIN 4.5 10/01/2015 1351   AST 11 10/01/2015 1351   ALT 11 10/01/2015 1351   ALKPHOS 62 10/01/2015 1351   BILITOT 0.4 10/01/2015 1351   GFRNONAA >90 08/18/2014 0657   GFRAA >90 08/18/2014 0657      ASSESSMENT AND PLAN  Multiple sclerosis (HCC) - Plan: Compression stockings  Pedal edema - Plan: Compression stockings  Ataxic gait  Chronic fatigue  Urinary dysfunction  Dysesthesia   1.   For the dysesthesias we will increase the lamotrigine to 200  mg twice a day. Continue other medications 2..  Continue ocrelizumab, next infusion is November..  3..   Lasix for pedal edema --- if not better, she should see PCP. 4.    Refill Adderall 5.    She will return to see me in 4 months or sooner if she has new or worsening neurologic symptoms.    Richard A. Felecia Shelling, MD, PhD [id number], [id number] PM Certified in Neurology, Clinical Neurophysiology, Sleep Medicine, Pain Medicine and Neuroimaging  [facility] [address] [place],  [address] [phone]

## 2016-02-24 NOTE — Telephone Encounter (Signed)
Pt called stating she is getting worse. She is falling all the time, legs are very bruised. She feels terrible. Battling fatigue, feels very unstable right now. Please call and advise [phone]

## 2016-02-24 NOTE — Telephone Encounter (Signed)
I have spoken with Tonya Powers this morning.  She c/o increased difficulty with balance--sts. she is falling more, also more fatigued, in general doesn't feel well.  Appt. given this am with RAS/fim

## 2016-02-25 ENCOUNTER — Telehealth: Payer: Self-pay | Admitting: Neurology

## 2016-02-25 DIAGNOSIS — G35 Multiple sclerosis: Secondary | ICD-10-CM

## 2016-02-25 DIAGNOSIS — R269 Unspecified abnormalities of gait and mobility: Secondary | ICD-10-CM

## 2016-02-25 DIAGNOSIS — R29898 Other symptoms and signs involving the musculoskeletal system: Secondary | ICD-10-CM

## 2016-02-25 NOTE — Telephone Encounter (Signed)
I have spoken with [name].  She sts. Saia does not want to go to the ER.  Per RAS, stat MRI brain and c-spine with and without ordered/fim

## 2016-02-25 NOTE — Telephone Encounter (Signed)
I have spoken with Tonya Powers this afternoon.  She sts. pt. is much worse today, dizzy, with nausea, very weak, unable to even lift legs.  ? near syncope or exacerbation of dizziness, weakness while driving and had to pull over and pulled out of the driver's seat, "slumped over."  Still unable to move legs at all.  Per RAS, he would like [name] to have an MRI brain and cervical spine, with and without contrast.  It would be reasonable for [name] to go to the ER today, as he does not have a good explanation for why she is much worse today--she was just in the office yesterday and was able to stand, transfer from scooter to chair by herself.  In order to r/o cardiac or acute neuro event, pt. should go to the ER.  Tonya Powers verbalized understanding of same, agreeing and stating "I've never seen her like this."  She will call me back if needed/fim

## 2016-02-25 NOTE — Addendum Note (Signed)
Addended by: France Ravens I on: 02/25/2016 04:10 PM   Modules accepted: Orders

## 2016-02-25 NOTE — Telephone Encounter (Signed)
Patient's sister is calling stating the patient cannot move her legs. She is also very nauseated and would like a Rx for promethazine (PHENERGAN) 25 MG tablet called to [facility] on [address]. Please call the patient's sister and advise.

## 2016-02-25 NOTE — Telephone Encounter (Signed)
Patient's sister is calling back stating the patient does not want to go to the ER. The sister would like a call back and discuss the MRI.

## 2016-02-26 ENCOUNTER — Telehealth: Payer: Self-pay | Admitting: Neurology

## 2016-02-26 NOTE — Telephone Encounter (Signed)
Patient called regarding getting [facility].

## 2016-02-26 NOTE — Telephone Encounter (Signed)
I have spoken with Tonya Powers this afternoon--she sts. she will be alone at home next week while her family is on vacation, and insurance will cover up to $4900.00 for in home care.  She is info for her insurance co.  I am not sure what they need but am happy to complete any forms they need.  She will ask them to fax any required paperwork to my attn, fax# [phone]

## 2016-02-27 ENCOUNTER — Telehealth: Payer: Self-pay | Admitting: Neurology

## 2016-02-27 MED ORDER — PROMETHAZINE HCL 25 MG PO TABS: 25.0000 mg | ORAL_TABLET | Freq: Four times a day (QID) | ORAL | 3 refills | Status: DC | PRN

## 2016-02-27 NOTE — Telephone Encounter (Signed)
Patient's daughter called after hours call center at 5:34 PM and I called her back. Patient's daughter lives in [place] and was on the phone with the patient to just recently saw Dr. Felecia Shelling in follow-up on 02/24/2016. Daughter is concerned that patient did not sound right, slurring of speech, and is worried about an acute neurological event such as stroke. I explained to the daughter that it may be the most prudent thing to do to take her to the emergency room, daughter will talk to her father, patient's husband. She also was inquiring if patient could be seen tomorrow for work and with Dr. Felecia Shelling. I told her that I went broke my phone note to him and to his nurse.

## 2016-02-27 NOTE — Telephone Encounter (Signed)
Phenergan escribed as requested/fim

## 2016-02-27 NOTE — Telephone Encounter (Signed)
Pt requesting rx promethazine (PHENERGAN) 25 MG tablet [facility], [address] AT [address]

## 2016-02-28 ENCOUNTER — Ambulatory Visit
Admission: RE | Admit: 2016-02-28 | Discharge: 2016-02-28 | Disposition: A | Discharge status: Home / Self Care | Payer: Medicare Other | Source: Ambulatory Visit | Attending: Neurology | Admitting: Neurology

## 2016-02-28 ENCOUNTER — Telehealth: Payer: Self-pay | Admitting: Neurology

## 2016-02-28 DIAGNOSIS — R29898 Other symptoms and signs involving the musculoskeletal system: Secondary | ICD-10-CM | POA: Diagnosis not present

## 2016-02-28 DIAGNOSIS — R269 Unspecified abnormalities of gait and mobility: Secondary | ICD-10-CM

## 2016-02-28 DIAGNOSIS — G35 Multiple sclerosis: Secondary | ICD-10-CM

## 2016-02-28 DIAGNOSIS — M50223 Other cervical disc displacement at C6-C7 level: Secondary | ICD-10-CM | POA: Diagnosis not present

## 2016-02-28 MED ORDER — GADOBENATE DIMEGLUMINE 529 MG/ML IV SOLN
12.0000 mL | Freq: Once | INTRAVENOUS | Status: AC | PRN
  Administered 2016-02-28: 12 mL via INTRAVENOUS

## 2016-02-28 NOTE — Telephone Encounter (Signed)
I have spoken with [name] this morning.  She was not aware of her dtr's call to Dr. Rexene Alberts yesterday.  This morning, Tonya Powers's speech is clear, deliberate.  She sts. she knows her dtr would like her to go to the ER, but she does not want to go at this time.  She ackowledges she feels worse.  Denies fever or other signs of infection.  I have offered w/i with Dr. Felecia Shelling this am, but she has declined.  She has MRI brain, c-spine sched. for this evening at 1730.  Dr. Felecia Shelling is aware and will try to view images this evening./fim

## 2016-02-28 NOTE — Telephone Encounter (Signed)
LMTC./fim 

## 2016-02-28 NOTE — Telephone Encounter (Signed)
I spoke with Tonya Powers is lying down) and he will relay the message.    The MRI of the brain and cervical spine did not show anything new compared to the 2016 MRIs.  If she continues to worsen over the weekend she needs to get blood work and urinalysis with culture to make sure that there is no infection or metabolic derangement that is affecting her.

## 2016-02-29 ENCOUNTER — Encounter (HOSPITAL_COMMUNITY): Payer: Self-pay | Admitting: Emergency Medicine

## 2016-02-29 ENCOUNTER — Ambulatory Visit (HOSPITAL_COMMUNITY)
Admission: EM | Admit: 2016-02-29 | Discharge: 2016-02-29 | Disposition: A | Discharge status: Home / Self Care | Payer: Medicare Other | Attending: Family Medicine | Admitting: Family Medicine

## 2016-02-29 DIAGNOSIS — H6983 Other specified disorders of Eustachian tube, bilateral: Secondary | ICD-10-CM

## 2016-02-29 DIAGNOSIS — R112 Nausea with vomiting, unspecified: Secondary | ICD-10-CM | POA: Diagnosis not present

## 2016-02-29 DIAGNOSIS — R42 Dizziness and giddiness: Secondary | ICD-10-CM

## 2016-02-29 MED ORDER — MECLIZINE HCL 25 MG PO TABS: 25.0000 mg | ORAL_TABLET | Freq: Three times a day (TID) | ORAL | 0 refills | Status: DC | PRN

## 2016-02-29 MED ORDER — ONDANSETRON HCL 4 MG PO TABS: 4.0000 mg | ORAL_TABLET | Freq: Three times a day (TID) | ORAL | 0 refills | Status: DC | PRN

## 2016-02-29 NOTE — ED Triage Notes (Signed)
Pt is here w/sister and brother in law  Reports she has been feeling dizzy, nauseas, confused and blurred vision  Has hx of MS... Had MRI done yest and her PCP told her to f/u here at the [facility] to r/o URI vs MS complications  A&O x4... NAD... Brought back on wheelchair.

## 2016-02-29 NOTE — Discharge Instructions (Signed)
Recommend start Zofran every 8 hours as needed for nausea/vomiting. Start Meclizine every 8 hours as needed for dizziness. Recommend follow-up with her PCP or Neurologist if symptoms persist or go to ER if symptoms worsen.

## 2016-02-29 NOTE — ED Provider Notes (Signed)
CSN: [id number]     Arrival date & time 02/29/16  1645 History   First MD Initiated Contact with Patient 02/29/16 1841     Chief Complaint  Patient presents with  . Dizziness   (Consider location/radiation/quality/duration/timing/severity/associated sxs/prior Treatment) 55 year old female is brought in by her sister and brother in law with concern over dizziness, nauseous, vomiting and blurred vision for the past 5-6 days. She is uncertain whether these symptoms are due to a viral illness or part of her Multiple Sclerosis syndrome.  She started with some dizziness and blurry vision about 6 days ago. Then started getting nauseous and has vomited daily. Last vomited this morning. She has been taking Phenergan with minimal relief. She feels a little better this evening but still having trouble focusing. She just had a MRI of her brain yesterday which did not show any major changes from her last MRI almost 1 year ago.  She is weak and unable to stand on her own. She sits in a wheelchair. Relatives asking multiple questions about various testing for viruses.       Past Medical History:  Diagnosis Date  . Anxiety   . Depression   . Gait instability    uses wheelchair or assistance  . H/O steroid therapy    IV infusion every 6 to 8 weeks- last 05-31-14 [facility]  . History of breast biopsy   . Multiple sclerosis ([place])   . Neuromuscular disorder ([place])    MS  . PONV (postoperative nausea and vomiting)   . Skin cancer    Past Surgical History:  Procedure Laterality Date  . ABDOMINAL HYSTERECTOMY    . BREAST BIOPSY Right 09/20/2012   Procedure: BREAST BIOPSY WITH NEEDLE LOCALIZATION;  Surgeon: Rolm Bookbinder, MD;  Location: [facility];  Service: General;  Laterality: Right;  . CESAREAN SECTION    . COLONOSCOPY WITH PROPOFOL N/A 07/13/2014   Procedure: COLONOSCOPY WITH PROPOFOL;  Surgeon: Beryle Beams, MD;  Location: [facility];  Service: Endoscopy;  Laterality:  N/A;  . DEBRIDEMENT SKIN / SQ / MUSCLE OF ARM Right 07/13/2003   I & D; debridement of tissue within triceps muscle  . LEG SURGERY     sx as a child  . MELANOMA EXCISION    . PARTIAL HYSTERECTOMY    . TONSILLECTOMY     as a child   Family History  Problem Relation Age of Onset  . Hyperlipidemia Mother   . Hypertension Mother   . Hypertension Father   . Diabetes type II Father    Social History  Substance Use Topics  . Smoking status: Never Smoker  . Smokeless tobacco: Never Used  . Alcohol use Yes     Comment: social   OB History    No data available     Review of Systems  Constitutional: Positive for fatigue. Negative for fever.  HENT: Negative for congestion.   Eyes: Positive for visual disturbance. Negative for photophobia, pain, discharge and redness.  Respiratory: Negative for cough and shortness of breath.   Cardiovascular: Negative for chest pain.  Gastrointestinal: Positive for nausea and vomiting. Negative for abdominal pain and diarrhea.  Genitourinary: Negative for difficulty urinating.  Allergic/Immunologic: Positive for immunocompromised state.  Neurological: Positive for dizziness and weakness. Negative for seizures, syncope, facial asymmetry, speech difficulty, numbness and headaches.    Allergies  Codeine  Home Medications   Prior to Admission medications   Medication Sig Start Date End Date Taking? Authorizing Provider  acetaminophen (TYLENOL) 500 MG tablet Take 1,000 mg by mouth every 6 (six) hours as needed for headache (headache).   Yes Historical Provider, MD  ALPRAZolam Duanne Moron) 0.5 MG tablet Take 0.5 mg by mouth daily as needed for anxiety (anxiety).  06/24/12  Yes Historical Provider, MD  amphetamine-dextroamphetamine (ADDERALL) 20 MG tablet Take 1 tablet (20 mg total) by mouth 2 (two) times daily. 02/24/16  Yes Britt Bottom, MD  Cholecalciferol (VITAMIN D PO) Take 1 tablet by mouth every morning.   Yes Historical Provider, MD  clonazePAM  (KLONOPIN) 1 MG tablet Take 1 tablet (1 mg total) by mouth at bedtime as needed for anxiety (sleep). 09/17/15  Yes Britt Bottom, MD  desmopressin (DDAVP) 0.1 MG tablet Take 1 tablet (0.1 mg total) by mouth at bedtime as needed (bladder.). 01/30/16  Yes Britt Bottom, MD  estradiol (CLIMARA - DOSED IN MG/24 HR) 0.075 mg/24hr patch Place 0.075 mg onto the skin once a week. Put on Saturdays. 07/02/14  Yes Historical Provider, MD  gabapentin (NEURONTIN) 800 MG tablet Take 1 tablet (800 mg total) by mouth 4 (four) times daily. 10/17/15  Yes Britt Bottom, MD  promethazine (PHENERGAN) 25 MG tablet Take 1 tablet (25 mg total) by mouth every 6 (six) hours as needed for nausea (nausea). 02/27/16  Yes Britt Bottom, MD  tapentadol (NUCYNTA) 50 MG TABS tablet Take 1 tablet (50 mg total) by mouth 3 (three) times daily as needed for moderate pain or severe pain (pain). 09/17/15  Yes Britt Bottom, MD  traMADol (ULTRAM) 50 MG tablet Take 1 tablet (50 mg total) by mouth 3 (three) times daily as needed for moderate pain (pain). 09/17/15  Yes Britt Bottom, MD  zolpidem (AMBIEN) 10 MG tablet Take 1 tablet (10 mg total) by mouth at bedtime as needed for sleep (sleep). 01/30/16  Yes Britt Bottom, MD  diazepam (VALIUM) 5 MG tablet TAKE 1 TABLET BY MOUTH EVERY 6 HOURS AS NEEDED FOR ANXIETY 11/05/15   Britt Bottom, MD  doxepin (SINEQUAN) 10 MG capsule TAKE 1 CAPSULE(10 MG) BY MOUTH AT BEDTIME AS NEEDED 12/06/15   Britt Bottom, MD  furosemide (LASIX) 20 MG tablet 1/2 to 1 pill po every mornig 02/24/16   Britt Bottom, MD  ibuprofen (ADVIL,MOTRIN) 200 MG tablet Take 400 mg by mouth every 6 (six) hours as needed for headache (headache).    Historical Provider, MD  lamoTRIgine (LAMICTAL) 200 MG tablet Take 1 tablet (200 mg total) by mouth 3 (three) times daily. 02/24/16   Britt Bottom, MD  meclizine (ANTIVERT) 25 MG tablet Take 1 tablet (25 mg total) by mouth 3 (three) times daily as needed for dizziness. 02/29/16   Katy Apo, NP  ocrelizumab 600 mg in sodium chloride 0.9 % 500 mL Inject 600 mg into the vein every 6 (six) months.    Historical Provider, MD  ondansetron (ZOFRAN) 4 MG tablet Take 1 tablet (4 mg total) by mouth every 8 (eight) hours as needed for nausea or vomiting. 02/29/16   Katy Apo, NP   Meds Ordered and Administered this Visit  Medications - No data to display  There were no vitals taken for this visit. No data found.   Physical Exam  Constitutional: She is oriented to person, place, and time. She appears well-developed and well-nourished. She is cooperative. She does not appear ill. No distress.  HENT:  Head: Normocephalic and atraumatic.  Right Ear: Hearing, external  ear and ear canal normal. Tympanic membrane is bulging. Tympanic membrane is not injected and not erythematous. A middle ear effusion is present.  Left Ear: Hearing, external ear and ear canal normal. Tympanic membrane is not injected, not erythematous and not bulging. A middle ear effusion is present.  Nose: Nose normal. Right sinus exhibits no maxillary sinus tenderness and no frontal sinus tenderness. Left sinus exhibits no maxillary sinus tenderness and no frontal sinus tenderness.  Mouth/Throat: Uvula is midline, oropharynx is clear and moist and mucous membranes are normal.  Eyes: Conjunctivae, EOM and lids are normal. Pupils are equal, round, and reactive to light.  Fundoscopic exam:      The right eye shows no hemorrhage and no papilledema.       The left eye shows no hemorrhage and no papilledema.  Neck: Normal range of motion. Neck supple.  Cardiovascular: Normal rate, regular rhythm and normal heart sounds.   Pulmonary/Chest: Effort normal and breath sounds normal.  Lymphadenopathy:    She has no cervical adenopathy.  Neurological: She is alert and oriented to person, place, and time. She displays atrophy (legs bilaterally). No cranial nerve deficit. Abnormal coordination: Unable to assess gait  since patient is in a wheel chair.    Urgent Care Course   Clinical Course    Procedures (including critical care time)  Labs Review Labs Reviewed - No data to display  Imaging Review Mr Kizzie Fantasia Contrast  Result Date: 02/28/2016  [facility] [address], [place], [place] [address] [phone] NEUROIMAGING REPORT STUDY DATE: 02/28/2016 PATIENT NAME: VANNYA BICKNELL DOB: February 17, 1961 MRN: [id number] EXAM: MRI Brain with and without contrast ORDERING CLINICIAN: Richard A. Sater, MD. PhD CLINICAL HISTORY: 55 year old woman with multiple sclerosis, worsening gait and leg weakness COMPARISON FILMS: MRI 04/04/2015 TECHNIQUE:MRI of the brain with and without contrast was obtained utilizing 5 mm axial slices with T1, T2, T2 flair, SWI and diffusion weighted views.  T1 sagittal, T2 coronal and postcontrast views in the axial and coronal plane were obtained. CONTRAST: 12 ml [employer] IMAGING SITE: [facility], [place]. FINDINGS: On sagittal images, the spinal cord is imaged caudally to C4 and is normal in caliber. Abnormal signal is noted within the spinal cord on the sagittal images at C2-C3 and C3  The contents of the posterior fossa are of normal size and position.   The pituitary gland and optic chiasm appear normal.    Brain volume appears normal.   The ventricles are normal in size and without distortion.  There are no abnormal extra-axial collections of fluid.  The brainstem appears normal. There is a single T2/FLAIR hyperintense focus in the left cerebellar hemisphere and a possible subtle focus in the left middle cerebellar peduncle. Both of these were present on the 04/04/2015 MRI.  The deep gray matter appears normal.  In the hemispheres, there are scattered T2/FLAIR hyperintense foci in the periventricular, juxtacortical and deep white matter with chronic demyelinating plaque. When compared to the prior MRI, there is no definite change and none of the  foci appears to be acute.  Diffusion weighted images show one small hyperintense focus in the left cerebellar hemisphere though the focus was also present on the 2016 MRI and this likely represents shine through..  Susceptibility weighted images are normal.   The orbits appear normal.   The VIIth/VIIIth nerve complex appears normal.  The mastoid air cells appear normal.  The paranasal sinuses appear normal.  Flow voids are  identified within the major intracerebral arteries.   After the infusion of contrast material, a normal enhancement pattern is noted. Compared to the MRI dated 04/04/2015, there is no definite interval change.    This MRI of the brain with and without contrast shows the following: 1.   T2/FLAIR hyperintense foci in the periventricular, juxtacortical and deep white matter of both hemispheres and in the left cerebellar hemisphere in a pattern and configuration consistent with chronic demyelinating plaque associated with multiple sclerosis. None of the foci enhances. Compared to the MRI from 04/04/2015, there is no interval change. 2.    There is a normal enhancement pattern and there are no acute findings. INTERPRETING PHYSICIAN: Richard A. Felecia Shelling, MD, PhD Certified in  Neuroimaging by [employer]   Mr Cervical Spine Wo Contrast  Result Date: 02/28/2016  [facility] [address], [place], [place] [address] [phone] NEUROIMAGING REPORT STUDY DATE: 02/28/2016 PATIENT NAME: DENICE SCOFIELD DOB: Feb 25, 1961 MRN: [id number] EXAM: MRI of the cervical spine ORDERING CLINICIAN: Richard A. Felecia Shelling, MD. PhD CLINICAL HISTORY: 55 year old woman with multiple sclerosis, gait disturbance and leg weakness COMPARISON FILMS: MRI 04/04/2015 TECHNIQUE: MRI of the cervical spine was obtained utilizing 3 mm sagittal slices from the posterior fossa down to the T3-4 level with T1, T2 and inversion recovery views. In addition 4 mm axial slices from [id number] down to T1-2 level  were included with T2 and gradient echo views. CONTRAST: None IMAGING SITE: [facility], [place], [place], [place] FINDINGS: :  On sagittal images, the spine is imaged from above the cervicomedullary junction to T3.   There are hyperintense foci within the spinal cord posteriorly adjacent to C2-C3, anteriorly at C3, left posterolateral at C4-C5, right greater than left lateral at C5-C6 and posteriorly at C7-T1.   The vertebral bodies are normally aligned.   The vertebral bodies have normal signal.  The discs and interspaces were further evaluated on axial views from C2 to T1 as follows: C2 - C3:  The disc and interspace appear normal. C3 - C4:  The disc and interspace appear normal. C4 - C5:  The disc and interspace appear normal. C5 - C6:  The disc and interspace appear normal. C6 - C7:  There is mild disc bulging that does not lead to any nerve root impingement. C7 - T1:  The disc and interspace appear normal. When compared to the MRI of the cervical spine 04/04/2015, there is no definite change within the spinal cord.    This MRI of the cervical spine shows the following: 1.   There are multiple lesions within the spinal cord adjacent to C2-C3, C3, C4-C5, C5-C6 and C7-T1. When compared to the MRI dated 04/04/2015, these lesions appear to be chronic. 2.    Mild degenerative changes at C6-C7 that did not lead to any nerve root impingement. INTERPRETING PHYSICIAN: Richard A. Felecia Shelling, MD, PhD Certified in  Neuroimaging by [employer]     Visual Acuity Review  Right Eye Distance:   Left Eye Distance:   Bilateral Distance:    Right Eye Near:   Left Eye Near:    Bilateral Near:         MDM   1. Dizziness   2. Non-intractable vomiting with nausea, vomiting of unspecified type   3. Eustachian tube dysfunction, bilateral    Discussed with patient and relatives that we are uncertain of cause of dizziness, nausea and vomiting- may be due to fluid behind  her eardrum  which can also make you nauseous and vomit. Discussed that we can perform limited labwork here and further workup in the ER would be necessary. They wanted to try medication for dizziness and nausea tonight and will go to ER if symptoms do not improve or ASAP if symptoms worsen. She is stable and in no apparent distress nor do clinical findings suggest immediate transfer to ER. Recommend start Zofran 4mg  as directed. May also take Meclizine every 8 hours as needed. May need to see an eye doctor if vision changes persist. Recommend follow-up with her Neurologist or her primary care provider in 2 to 3 days if symptoms do not improve or go to ER if symptoms worsen.     Katy Apo, NP 03/01/16 [phone]

## 2016-03-03 ENCOUNTER — Telehealth: Payer: Self-pay | Admitting: Neurology

## 2016-03-03 DIAGNOSIS — R110 Nausea: Secondary | ICD-10-CM | POA: Diagnosis not present

## 2016-03-03 DIAGNOSIS — R42 Dizziness and giddiness: Secondary | ICD-10-CM | POA: Diagnosis not present

## 2016-03-03 NOTE — Telephone Encounter (Signed)
I have spoken with Tonya Powers this morning.  Dr. Felecia Shelling spoken with Tonya Powers [facility] husband) on Friday and let him know that MRI's of brain and c-spine did not show new MS lesions.  Tonya Powers verbalized understanding of that, sts. Tonya Powers has continuous nausea, so she is taking Phenergan 1/2 tab every 3-4 hours,  Sts. it seems she is not able to focus her eyes; is blinking often.  Sts. they took Tonya Powers to urgent care over the weekend, per RAS instruction, but no labs were done and they were told Tonya Powers's sx. were beyond what they were set up to handle.  I have advised that they take Tonya Powers to her pcp or ER to r/o infection as a cause of her sx.  Tonya Powers verbalized understanding of same/fim

## 2016-03-03 NOTE — Telephone Encounter (Addendum)
Patient's sister is calling stating the patient is having trouble focusing her eyes x one week. Her sister thinks she needs to be seen. Please call to discuss.

## 2016-03-03 NOTE — Telephone Encounter (Signed)
Agree with plan to refer patient to PCP or urgent care. -VRP

## 2016-03-16 MED ORDER — METHYLPREDNISOLONE 4 MG PO TBPK: ORAL_TABLET | ORAL | 0 refills | Status: DC

## 2016-03-16 NOTE — Telephone Encounter (Signed)
Pt has called in stating she is still experiencing dizziness. States she can not open her eyes with out being dizzy. She has been seen by urgent care as well as her pcp. She says her pcp has drawn labs but have all come back normal. Pts primary is Dr. Serita Grammes, pt saw the Practitioner. Please call and advise

## 2016-03-16 NOTE — Addendum Note (Signed)
Addended by: France Ravens I on: 03/16/2016 04:46 PM   Modules accepted: Orders

## 2016-03-16 NOTE — Telephone Encounter (Signed)
I have spoken with Tonya Powers this afternoon.  She is agreeable to trying Medrol dose pack.  Rx. escribed to [facility].  She sts. dizziness is constant, not related to position.  She will call in a few days with update/fim

## 2016-03-16 NOTE — Telephone Encounter (Signed)
Please call in a steroid pack for the dizziness. If not already on meclizine, we can call in meclizine 25 mg 3 times a day when necessary for #90 #0  If dizziness is positional and persists I will see her as she might benefit from an Epley maneuver.

## 2016-03-19 ENCOUNTER — Other Ambulatory Visit: Payer: Self-pay | Admitting: Neurology

## 2016-03-19 DIAGNOSIS — D2271 Melanocytic nevi of right lower limb, including hip: Secondary | ICD-10-CM | POA: Diagnosis not present

## 2016-03-19 DIAGNOSIS — D225 Melanocytic nevi of trunk: Secondary | ICD-10-CM | POA: Diagnosis not present

## 2016-03-19 DIAGNOSIS — Z85828 Personal history of other malignant neoplasm of skin: Secondary | ICD-10-CM | POA: Diagnosis not present

## 2016-03-19 DIAGNOSIS — D1801 Hemangioma of skin and subcutaneous tissue: Secondary | ICD-10-CM | POA: Diagnosis not present

## 2016-03-19 DIAGNOSIS — L821 Other seborrheic keratosis: Secondary | ICD-10-CM | POA: Diagnosis not present

## 2016-03-19 DIAGNOSIS — L570 Actinic keratosis: Secondary | ICD-10-CM | POA: Diagnosis not present

## 2016-03-19 DIAGNOSIS — L814 Other melanin hyperpigmentation: Secondary | ICD-10-CM | POA: Diagnosis not present

## 2016-03-19 MED ORDER — CLONAZEPAM 1 MG PO TABS: 1.0000 mg | ORAL_TABLET | Freq: Every evening | ORAL | 5 refills | Status: DC | PRN

## 2016-03-19 NOTE — Telephone Encounter (Signed)
Rx printed and placed on Dr. Garth Bigness desk for [facility]

## 2016-03-19 NOTE — Telephone Encounter (Signed)
Patient requesting refill of clonazePAM (KLONOPIN) 1 MG tablet Pharmacy:pick up

## 2016-03-19 NOTE — Telephone Encounter (Signed)
Rx. faxed to pharmacy with fax confirmation received/fim

## 2016-03-23 ENCOUNTER — Telehealth: Payer: Self-pay | Admitting: Neurology

## 2016-03-23 NOTE — Telephone Encounter (Signed)
Pt called in stating she can not drive because she is fainting and has a lot of nausea. Recently she was driving and had to pull over to have her husband come get her. Please call and advise [phone]

## 2016-03-23 NOTE — Telephone Encounter (Signed)
I have spoken with Tonya Powers this afternoon.  She sts. intermittent dizziness continues--interfering with her life. She has nausea associated with dizziness--appt. with RAS given tomorrow/fim

## 2016-03-24 ENCOUNTER — Ambulatory Visit (INDEPENDENT_AMBULATORY_CARE_PROVIDER_SITE_OTHER): Payer: Medicare Other | Admitting: Neurology

## 2016-03-24 ENCOUNTER — Encounter: Payer: Self-pay | Admitting: Neurology

## 2016-03-24 VITALS — BP 130/80 | HR 78 | Resp 6 | Ht 65.0 in | Wt 136.0 lb

## 2016-03-24 DIAGNOSIS — R5382 Chronic fatigue, unspecified: Secondary | ICD-10-CM | POA: Diagnosis not present

## 2016-03-24 DIAGNOSIS — R39198 Other difficulties with micturition: Secondary | ICD-10-CM

## 2016-03-24 DIAGNOSIS — R260 Ataxic gait: Secondary | ICD-10-CM | POA: Diagnosis not present

## 2016-03-24 DIAGNOSIS — G4700 Insomnia, unspecified: Secondary | ICD-10-CM

## 2016-03-24 DIAGNOSIS — G35 Multiple sclerosis: Secondary | ICD-10-CM | POA: Diagnosis not present

## 2016-03-24 DIAGNOSIS — H8112 Benign paroxysmal vertigo, left ear: Secondary | ICD-10-CM

## 2016-03-24 DIAGNOSIS — R208 Other disturbances of skin sensation: Secondary | ICD-10-CM

## 2016-03-24 DIAGNOSIS — H8110 Benign paroxysmal vertigo, unspecified ear: Secondary | ICD-10-CM | POA: Insufficient documentation

## 2016-03-24 DIAGNOSIS — F5104 Psychophysiologic insomnia: Secondary | ICD-10-CM

## 2016-03-24 NOTE — Progress Notes (Signed)
[facility]  PATIENT: Tonya Powers DOB: April 14, 1961  REFERRING CLINICIAN: Mayra Neer is PCP HISTORY FROM: Patient  REASON FOR VISIT: MS and poor gait   HISTORICAL  CHIEF COMPLAINT:  Chief Complaint  Patient presents with  . Multiple Sclerosis    She has had part A and part B of first Ocrelizumab infusion.  Sts. she has vertigo that has continued--prevents her from driving./fim    HISTORY OF PRESENT ILLNESS:  Tonya Powers is a 55 year old woman with MS.    She is having more dizziness since August and seems to be more confused when this occurs.  Her sister noted jerking of the eyes with her vertigo spells initially though she notes it less more recently.  She is also doing worse with memory.     MRI of the brain and cervical spine 3 - 4 weeks ago showed no acute lesions and brain was unchanged.     MS:   She was on Tysabri and was more stable but converted to JCV Ab positive and stopped.   She could not tolerate Tecfidera.    She had Ocrelizumab in May / June and tolerated the infusions well.   Gait/strength/sensation:    She has more trouble transferring and using her walker.  Therefore, she mostly uses her scooter.  IV steroids did not help in August..    She has left > right leg weakness and spasticity.     She has numbness and dysesthesias from the lower chest down and in her legs.   Her right hand also sometimes dysesthetic.  Lamotrigine and gabapentin help a little bit.   Bras and tight clothes are worse.      Ampyra had not helped in the past.    She has done PT a few times in the past but never felt she got a benefit.   Baclofen made her feel weaker and she stopped  Bladder/bowel:  Bladder function is poor with hesitancy > urgency.  She has to wear pads and has incontinence at night.    Taking desmopressin has helped her nocturia which helps the quality of her sleep..  She reports severe constipation. Linzess did not help.    Nucynta does not worsen  constipation like opiates did.         Fatigue/sleep:   Fatigue is present many days, worse with heat.   Adderall helps some.   When fatiue is more severe she takes  Solu-Medrol infusions.   She has both sleep onset and sleep maintenance insomnia.   She takes clonazepam 1 mg and Ambien every night due to difficulty falling and staying asleep. Clonazepam also helps her leg spasms as well as helps her to fall asleep.   Sleep is better since being placed on desmopressin for her bladder.  Mood/cognition:   She notes mild depression and anxiety.  Her lack of independence depresses her but she remains active and tries to get out of th house most days..   Her depression is usually worse when she has more pain   She prefers not to take an antidepressant.    She rarely takes Xanax 0.5 mg when she has more anxiety. She notes more trouble with cognition, especially short term memory.    Adderall helps focus some  Pain:   She reports painful  in the right flank and the hip and buttock regions and down her leg somewhat.  Spasticity is painful .  She recalls a recent episode where she  had severe dysesthesias in the chest and she had to stop her car while she was driving.  Nucynta helps as much as opiate and is better tolerated with less constipation.  Tramadol does not help nearly as much.  She takes gabapentin 800 mg 3-4 a day.        MS History:  She presented with optic neuritis in 1991 followed shortly by difficulties with leg weakness and by right trigeminal neuralgia. In retrospect, couple years earlier when she had her daughter, has some difficulties with her legs and needed to go on short-term disability. At first she was not diagnosed with MS but after more of the symptoms she underwent MRI testing and had a lumbar puncture by Dr. Johnnye Sima. The imaging and the CSF was consistent with multiple sclerosis. When Betaseron became available she was placed on that. She was on Betaseron for about 10 years. She felt that  she did not have too many exacerbations during that time but she had a lot of difficulty tolerating the Betaseron due to skin reactions. Around 12-15 years ago, she started to use a cane and she has had progressive gait disturbance over the last decade. Around the house for short distances, she uses a walker but uses her scooter for longer distances. Outside she uses a wheelchair pushed by others   REVIEW OF SYSTEMS:  Constitutional: No fevers, chills, sweats, or change in appetite.  She has Fatigue Eyes: No visual changes, double vision, eye pain Ear, nose and throat: No hearing loss, ear pain, nasal congestion, sore throat Cardiovascular: No chest pain, palpitations Respiratory:  No shortness of breath at rest or with exertion.   No wheezes GastrointestinaI: Mild dysphagia.  Constipation.  No nausea, vomiting, diarrhea.   Genitourinary:  No dysuria but has urinary retention and frequency.  Desmopressin helps nocturia. Musculoskeletal:  No neck pain,   Has lower back pain and buttocks.  Hips ans other joints ok Integumentary: No rash, pruritus, skin lesions Neurological: as above Psychiatric: see above Endocrine: No palpitations, diaphoresis, change in appetite, change in weigh or increased thirst Hematologic/Lymphatic:  No anemia, purpura, petechiae. Allergic/Immunologic: No itchy/runny eyes, nasal congestion, recent allergic reactions, rashes  ALLERGIES: Allergies  Allergen Reactions  . Codeine Nausea And Vomiting    HOME MEDICATIONS: Outpatient Medications Prior to Visit  Medication Sig Dispense Refill  . acetaminophen (TYLENOL) 500 MG tablet Take 1,000 mg by mouth every 6 (six) hours as needed for headache (headache).    . ALPRAZolam (XANAX) 0.5 MG tablet Take 0.5 mg by mouth daily as needed for anxiety (anxiety).     Marland Kitchen amphetamine-dextroamphetamine (ADDERALL) 20 MG tablet Take 1 tablet (20 mg total) by mouth 2 (two) times daily. 60 tablet 0  . Cholecalciferol (VITAMIN D PO) Take  1 tablet by mouth every morning.    . clonazePAM (KLONOPIN) 1 MG tablet Take 1 tablet (1 mg total) by mouth at bedtime as needed for anxiety (sleep). 30 tablet 5  . desmopressin (DDAVP) 0.1 MG tablet Take 1 tablet (0.1 mg total) by mouth at bedtime as needed (bladder.). 30 tablet 11  . diazepam (VALIUM) 5 MG tablet TAKE 1 TABLET BY MOUTH EVERY 6 HOURS AS NEEDED FOR ANXIETY 90 tablet 3  . doxepin (SINEQUAN) 10 MG capsule TAKE 1 CAPSULE(10 MG) BY MOUTH AT BEDTIME AS NEEDED 90 capsule 3  . estradiol (CLIMARA - DOSED IN MG/24 HR) 0.075 mg/24hr patch Place 0.075 mg onto the skin once a week. Put on Saturdays.  10  .  furosemide (LASIX) 20 MG tablet 1/2 to 1 pill po every mornig 30 tablet 11  . gabapentin (NEURONTIN) 800 MG tablet Take 1 tablet (800 mg total) by mouth 4 (four) times daily. 120 tablet 11  . ibuprofen (ADVIL,MOTRIN) 200 MG tablet Take 400 mg by mouth every 6 (six) hours as needed for headache (headache).    . lamoTRIgine (LAMICTAL) 200 MG tablet Take 1 tablet (200 mg total) by mouth 3 (three) times daily. 90 tablet 11  . meclizine (ANTIVERT) 25 MG tablet Take 1 tablet (25 mg total) by mouth 3 (three) times daily as needed for dizziness. 30 tablet 0  . methylPREDNISolone (MEDROL DOSEPAK) 4 MG TBPK tablet Take 6 tablets on day 1, 5 tablets on day 2, 4 tablets on day 3, 3 tablets on day 4, 2 talets on day 5, and 1 tablet on day 6 21 tablet 0  . ocrelizumab 600 mg in sodium chloride 0.9 % 500 mL Inject 600 mg into the vein every 6 (six) months.    . ondansetron (ZOFRAN) 4 MG tablet Take 1 tablet (4 mg total) by mouth every 8 (eight) hours as needed for nausea or vomiting. 12 tablet 0  . promethazine (PHENERGAN) 25 MG tablet Take 1 tablet (25 mg total) by mouth every 6 (six) hours as needed for nausea (nausea). 30 tablet 3  . tapentadol (NUCYNTA) 50 MG TABS tablet Take 1 tablet (50 mg total) by mouth 3 (three) times daily as needed for moderate pain or severe pain (pain). 90 tablet 0  . traMADol  (ULTRAM) 50 MG tablet Take 1 tablet (50 mg total) by mouth 3 (three) times daily as needed for moderate pain (pain). 90 tablet 5  . zolpidem (AMBIEN) 10 MG tablet Take 1 tablet (10 mg total) by mouth at bedtime as needed for sleep (sleep). 30 tablet 5   No facility-administered medications prior to visit.     PAST MEDICAL HISTORY: Past Medical History:  Diagnosis Date  . Anxiety   . Depression   . Gait instability    uses wheelchair or assistance  . H/O steroid therapy    IV infusion every 6 to 8 weeks- last 05-31-14 [facility]  . History of breast biopsy   . Multiple sclerosis ([place])   . Neuromuscular disorder ([place])    MS  . PONV (postoperative nausea and vomiting)   . Skin cancer     PAST SURGICAL HISTORY: Past Surgical History:  Procedure Laterality Date  . ABDOMINAL HYSTERECTOMY    . BREAST BIOPSY Right 09/20/2012   Procedure: BREAST BIOPSY WITH NEEDLE LOCALIZATION;  Surgeon: Rolm Bookbinder, MD;  Location: [facility];  Service: General;  Laterality: Right;  . CESAREAN SECTION    . COLONOSCOPY WITH PROPOFOL N/A 07/13/2014   Procedure: COLONOSCOPY WITH PROPOFOL;  Surgeon: Beryle Beams, MD;  Location: [facility];  Service: Endoscopy;  Laterality: N/A;  . DEBRIDEMENT SKIN / SQ / MUSCLE OF ARM Right 07/13/2003   I & D; debridement of tissue within triceps muscle  . LEG SURGERY     sx as a child  . MELANOMA EXCISION    . PARTIAL HYSTERECTOMY    . TONSILLECTOMY     as a child    FAMILY HISTORY: Family History  Problem Relation Age of Onset  . Hyperlipidemia Mother   . Hypertension Mother   . Hypertension Father   . Diabetes type II Father     SOCIAL HISTORY:  Social History   Social History  .  Marital status: Married    Spouse name: N/A  . Number of children: N/A  . Years of education: N/A   Occupational History  . Not on file.   Social History Main Topics  . Smoking status: Never Smoker  . Smokeless tobacco: Never Used  .  Alcohol use Yes     Comment: social  . Drug use: No  . Sexual activity: Not on file   Other Topics Concern  . Not on file   Social History Narrative  . No narrative on file     PHYSICAL EXAM  Vitals:   03/24/16 1618  BP: 130/80  Pulse: 78  Resp: (!) 6  Weight: 136 lb (61.7 kg)  Height: 5\' 5"  (1.651 m)    Body mass index is 22.63 kg/m.   General: The patient is well-developed and well-nourished and in no acute distress  HEENT:   [place]/AT, there is some scarring of the left tympanic membrane but no fluid was identified in either middle ear. No significant cerumen. Oropharynx was normal.  Skin/Ext: Extremities show pedal edema, bette than last visit.  Feet are red.   No rashes.      Neurologic Exam  Mental status: The patient is alert and oriented x 3 at the time of the examination. The patient has apparent normal recent and remote memory, with an apparently normal attention span and concentration ability.   Speech is normal.  Cranial nerves: Extraocular movements are full. There is good facial sensation to soft touch bilaterally.Facial strength is normal.  Trapezius and sternocleidomastoid strength is normal. No dysarthria is noted.  No obvious hearing deficits are noted.  Motor:  Muscle bulk and tone are normal. Strength is  3 / 5 in proximal legs and 4/5 distally, worse on left. Left and right arm are similar.    She uses arms to get out of chair  Sensory: Sensory testing is intact to soft touch, vibration sensation in arms but mild decreased touch in left leg.   .  Coordination: Cerebellar testing reveals reduced finger-nose-finger and poor heel-to-shin bilaterally.  Gait and station: She needs to use arms to stand.    Station is unsteady and gait requires bilateral support.  She can not tandem.     Reflexes: Deep tendon reflexes are symmetric and increased in legs bilaterally with spread at the knees.Nonsustained ankle clonus bilaterally.  Barany testing did not  evoke nystagmus but did cause vertigo words with left ear down    DIAGNOSTIC DATA (LABS, IMAGING, TESTING) - I reviewed patient records, labs, notes, testing and imaging myself where available.  Lab Results  Component Value Date   WBC 7.4 10/01/2015   HGB 12.5 08/17/2014   HCT 38.4 10/01/2015   MCV 93 10/01/2015   PLT 220 10/01/2015      Component Value Date/Time   NA 137 08/18/2014 0657   K 4.0 08/18/2014 0657   CL 105 08/18/2014 0657   CO2 28 08/18/2014 0657   GLUCOSE 91 08/18/2014 0657   BUN 7 08/18/2014 0657   CREATININE 0.43 (L) 08/18/2014 0657   CALCIUM 8.1 (L) 08/18/2014 0657   PROT 6.6 10/01/2015 1351   ALBUMIN 4.5 10/01/2015 1351   AST 11 10/01/2015 1351   ALT 11 10/01/2015 1351   ALKPHOS 62 10/01/2015 1351   BILITOT 0.4 10/01/2015 1351   GFRNONAA >90 08/18/2014 0657   GFRAA >90 08/18/2014 0657      ASSESSMENT AND PLAN  Multiple sclerosis (HCC)  Benign paroxysmal positional vertigo, left  Urinary dysfunction  Dysesthesia  Chronic insomnia  Ataxic gait  Chronic fatigue   1.    Epley maneuver starting on the left I instructed her sister at how to do the maneuver. Additionally, a handout with the Nestor Lewandowsky exercises was provided.  2..  Continue ocrelizumab, next infusion is November..  3..   For the dysesthesias we will increase the lamotrigine to 200 mg twice a day. Continue other medications 4.  She will return to see me in 4 months or sooner if she has new or worsening neurologic symptoms.    Thelia Tanksley A. Felecia Shelling, MD, PhD [id number], [id number] PM Certified in Neurology, Clinical Neurophysiology, Sleep Medicine, Pain Medicine and Neuroimaging  [facility] [address] [place], [place] [address] [phone]

## 2016-03-30 ENCOUNTER — Ambulatory Visit: Payer: Self-pay | Admitting: Neurology

## 2016-03-31 DIAGNOSIS — M545 Low back pain: Secondary | ICD-10-CM | POA: Diagnosis not present

## 2016-03-31 DIAGNOSIS — G35 Multiple sclerosis: Secondary | ICD-10-CM | POA: Diagnosis not present

## 2016-03-31 DIAGNOSIS — H8110 Benign paroxysmal vertigo, unspecified ear: Secondary | ICD-10-CM | POA: Diagnosis not present

## 2016-04-01 ENCOUNTER — Telehealth: Payer: Self-pay | Admitting: Neurology

## 2016-04-01 MED ORDER — TRAMADOL HCL 50 MG PO TABS: 50.0000 mg | ORAL_TABLET | Freq: Three times a day (TID) | ORAL | 5 refills | Status: DC | PRN

## 2016-04-01 NOTE — Telephone Encounter (Signed)
Patient called to request refill of traMADol (ULTRAM) 50 MG tablet

## 2016-04-01 NOTE — Telephone Encounter (Signed)
Rx. faxed to [facility]/fim 

## 2016-05-07 ENCOUNTER — Telehealth: Payer: Self-pay | Admitting: *Deleted

## 2016-05-07 NOTE — Telephone Encounter (Signed)
Spoke with Tonya Powers this afternoon.  She c/o continued fatigue, some leg weakness.  Requests SM 1gm IV.  She has decided not to receive part B of her first Ocrelizumab infusion at this time due to ? side effects she had after first infusion.  Per RAS, ok for SM infusion.  Orders completed and given to [facility] in the infusion suite/fim

## 2016-05-11 DIAGNOSIS — G35 Multiple sclerosis: Secondary | ICD-10-CM | POA: Diagnosis not present

## 2016-06-02 ENCOUNTER — Ambulatory Visit: Payer: Medicare Other | Admitting: Neurology

## 2016-06-03 ENCOUNTER — Telehealth: Payer: Self-pay | Admitting: *Deleted

## 2016-06-03 MED ORDER — AMPHETAMINE-DEXTROAMPHETAMINE 20 MG PO TABS: 20.0000 mg | ORAL_TABLET | Freq: Two times a day (BID) | ORAL | 0 refills | Status: DC

## 2016-06-03 NOTE — Telephone Encounter (Signed)
Rx. awaiting RAS sig.  I have spoken with Marua and advised rx. will be available to be picked up first thing tomorrow am/fim

## 2016-06-03 NOTE — Telephone Encounter (Signed)
Pt is requesting amphetamine-dextroamphetamine (ADDERALL) 20 MG tablet call when ready to pick up [phone]

## 2016-06-04 NOTE — Telephone Encounter (Signed)
Adderall rx. up front [facility]/fim 

## 2016-06-17 DIAGNOSIS — K6289 Other specified diseases of anus and rectum: Secondary | ICD-10-CM | POA: Diagnosis not present

## 2016-06-17 DIAGNOSIS — Z0000 Encounter for general adult medical examination without abnormal findings: Secondary | ICD-10-CM | POA: Diagnosis not present

## 2016-06-17 DIAGNOSIS — Z79890 Hormone replacement therapy (postmenopausal): Secondary | ICD-10-CM | POA: Diagnosis not present

## 2016-06-17 DIAGNOSIS — Z79899 Other long term (current) drug therapy: Secondary | ICD-10-CM | POA: Diagnosis not present

## 2016-06-17 DIAGNOSIS — F411 Generalized anxiety disorder: Secondary | ICD-10-CM | POA: Diagnosis not present

## 2016-06-17 DIAGNOSIS — G35 Multiple sclerosis: Secondary | ICD-10-CM | POA: Diagnosis not present

## 2016-06-17 DIAGNOSIS — E7800 Pure hypercholesterolemia, unspecified: Secondary | ICD-10-CM | POA: Diagnosis not present

## 2016-06-17 DIAGNOSIS — G8929 Other chronic pain: Secondary | ICD-10-CM | POA: Diagnosis not present

## 2016-06-17 DIAGNOSIS — G4700 Insomnia, unspecified: Secondary | ICD-10-CM | POA: Diagnosis not present

## 2016-06-17 DIAGNOSIS — M8580 Other specified disorders of bone density and structure, unspecified site: Secondary | ICD-10-CM | POA: Diagnosis not present

## 2016-06-25 ENCOUNTER — Other Ambulatory Visit: Payer: Self-pay | Admitting: Neurology

## 2016-06-25 MED ORDER — TAPENTADOL HCL 50 MG PO TABS: 50.0000 mg | ORAL_TABLET | Freq: Three times a day (TID) | ORAL | 0 refills | Status: DC | PRN

## 2016-06-25 NOTE — Telephone Encounter (Signed)
Pt request refill for tapentadol (NUCYNTA) 50 MG TABS tablet . Pt is aware the clinic closes tomorrow at noon and will reopen Wednesday 07/01/16.

## 2016-06-25 NOTE — Telephone Encounter (Signed)
Dr. Felecia Shelling pt, had OV in Sept and follow-up scheduled in Jan. Last rx written 09/17/15.

## 2016-06-26 NOTE — Telephone Encounter (Signed)
Rx printed, signed, up front for pick-up. 

## 2016-07-09 DIAGNOSIS — R7301 Impaired fasting glucose: Secondary | ICD-10-CM | POA: Diagnosis not present

## 2016-07-20 DIAGNOSIS — M8588 Other specified disorders of bone density and structure, other site: Secondary | ICD-10-CM | POA: Diagnosis not present

## 2016-07-21 ENCOUNTER — Other Ambulatory Visit: Payer: Self-pay | Admitting: Neurology

## 2016-07-21 ENCOUNTER — Ambulatory Visit (INDEPENDENT_AMBULATORY_CARE_PROVIDER_SITE_OTHER): Payer: Medicare Other | Admitting: Neurology

## 2016-07-21 ENCOUNTER — Encounter: Payer: Self-pay | Admitting: Neurology

## 2016-07-21 VITALS — BP 122/76 | HR 64 | Resp 18 | Ht 65.0 in | Wt 136.0 lb

## 2016-07-21 DIAGNOSIS — R39198 Other difficulties with micturition: Secondary | ICD-10-CM | POA: Diagnosis not present

## 2016-07-21 DIAGNOSIS — G35 Multiple sclerosis: Secondary | ICD-10-CM

## 2016-07-21 DIAGNOSIS — F5104 Psychophysiologic insomnia: Secondary | ICD-10-CM | POA: Diagnosis not present

## 2016-07-21 DIAGNOSIS — R5382 Chronic fatigue, unspecified: Secondary | ICD-10-CM | POA: Diagnosis not present

## 2016-07-21 DIAGNOSIS — F418 Other specified anxiety disorders: Secondary | ICD-10-CM

## 2016-07-21 DIAGNOSIS — R260 Ataxic gait: Secondary | ICD-10-CM | POA: Diagnosis not present

## 2016-07-21 DIAGNOSIS — H8112 Benign paroxysmal vertigo, left ear: Secondary | ICD-10-CM

## 2016-07-21 DIAGNOSIS — R208 Other disturbances of skin sensation: Secondary | ICD-10-CM

## 2016-07-21 MED ORDER — DIAZEPAM 5 MG PO TABS: 5.0000 mg | ORAL_TABLET | Freq: Three times a day (TID) | ORAL | 3 refills | Status: DC | PRN

## 2016-07-21 MED ORDER — AMPHETAMINE-DEXTROAMPHETAMINE 20 MG PO TABS: 20.0000 mg | ORAL_TABLET | Freq: Two times a day (BID) | ORAL | 0 refills | Status: DC

## 2016-07-21 MED ORDER — OXCARBAZEPINE 150 MG PO TABS: ORAL_TABLET | ORAL | 5 refills | Status: DC

## 2016-07-21 MED ORDER — TAPENTADOL HCL 50 MG PO TABS: 50.0000 mg | ORAL_TABLET | Freq: Three times a day (TID) | ORAL | 0 refills | Status: DC | PRN

## 2016-07-21 NOTE — Patient Instructions (Signed)
For 3 days, reduce lamotrigine to 2 pills a day and start oxcarbazepine one pill twice a day  For next 3 days, reduce lamotrigine to one pill a day and increase oxcarbazepine to 3 pills a day (1 in morning and 2 at night)  Then stop lamotrigine and continue oxcarbazepine 3 pills/day.

## 2016-07-21 NOTE — Progress Notes (Signed)
[facility]  PATIENT: Tonya Powers DOB: 1960/08/02  REFERRING CLINICIAN: Mayra Neer is PCP HISTORY FROM: Patient  REASON FOR VISIT: MS and poor gait   HISTORICAL  CHIEF COMPLAINT:  Chief Complaint  Patient presents with  . Multiple Sclerosis    She would like to discuss Ocrelizumab--would like to discuss if the sx she had after last infusion were side effects of Ocrelizumab.  Sts. she continues to hear heartbeat in left ear if she lies on her right side.  Has an occasional sore throat.  Can't stand for clothes to touch her skin--not much relief with Lamictal, Gabapentin/fim    HISTORY OF PRESENT ILLNESS:  Tonya Powers is a 56 year old woman with MS.      Dizziness:   She still has dizziness at times and she feels a sloshing sensation with her heartbeat when she lies on the left side.   The number of episodes improved after the Epley maneuver and she still does the exercises at times if she has several spells.    MS:   She was on Tysabri and was more stable but converted to JCV Ab positive and stopped.   She could not tolerate Tecfidera.    She had Ocrelizumab in May / June and tolerated the infusions well.  However, two months later, she had more neurologic symptoms and she decided to stop the Ocrelizumab.    She never resumed any other medications. She did take a couple doses of IV Solu-Medrol and felt better afterwards.    Gait/strength/sensation:    She has poor gait and has difficulty .    She uses a scooter mostly now. She uses a walker some every day and can go up to 30-40 feet.    Therefore, she mostly uses her scooter.  IV steroids did not help in August..     She has left > right leg weakness and spasticity.     She has numbness and dysesthesias from the lower chest down and in her legs.   Lamotrigine and gabapentin have not helped much.   TCA's have not helped.   She has not tried oxcarbazepine or tegretol.  Baclofen made her feel weaker and she  stopped  Bladder/bowel:  Bladder function is poor and she has a lot of hesitancy.    She has to wear pads and has incontinence at night.    Desmopressin used to help more at night than now.   She reports severe constipation. Linzess did not help.    Nucynta does not worsen constipation like opiates did but is very expensive so          Fatigue/sleep:   Fatigue is present most days.   Adderall helps some.   Heat makes it worse.   She does well dping pool exercises.,  When fatiue is more severe she takes  Solu-Medrol infusions.   She has both sleep onset and sleep maintenance insomnia.   She takes clonazepam 1 mg and Ambien every night due to difficulty falling and staying asleep. Clonazepam also helps her leg spasms as well as helps her to fall asleep.   Sleep was initially better afterbeing placed on desmopressin for her bladder but now less certain  Mood/cognition:   She notes mild depression and anxiety.    She prefers not to take an antidepressant.    She rarely takes Xanax 0.5 mg when she has more anxiety. She notes more trouble with cognition, especially short term memory.  Adderall helps focus some  Pain:   She reports painful  in the right flank and the hip and buttock regions and down her leg somewhat.  Spasticity is painful .  She recalls a recent episode where she had severe dysesthesias in the chest and she had to stop her car while she was driving.  Nucynta helps as much as opiate and is better tolerated with less constipation.  Tramadol does not help nearly as much.  She takes gabapentin 800 mg 3-4 a day.        MS History:  She presented with optic neuritis in 1991 followed shortly by difficulties with leg weakness and by right trigeminal neuralgia. In retrospect, couple years earlier when she had her daughter, has some difficulties with her legs and needed to go on short-term disability. At first she was not diagnosed with MS but after more of the symptoms she underwent MRI testing and  had a lumbar puncture by Dr. Johnnye Sima. The imaging and the CSF was consistent with multiple sclerosis. When Betaseron became available she was placed on that. She was on Betaseron for about 10 years. She felt that she did not have too many exacerbations during that time but she had a lot of difficulty tolerating the Betaseron due to skin reactions. Around 12-15 years ago, she started to use a cane and she has had progressive gait disturbance over the last decade. Around the house for short distances, she uses a walker but uses her scooter for longer distances. Outside she uses a wheelchair pushed by others   REVIEW OF SYSTEMS:  Constitutional: No fevers, chills, sweats, or change in appetite.  She has Fatigue Eyes: No visual changes, double vision, eye pain Ear, nose and throat: No hearing loss, ear pain, nasal congestion, sore throat Cardiovascular: No chest pain, palpitations Respiratory:  No shortness of breath at rest or with exertion.   No wheezes GastrointestinaI: Mild dysphagia.  Constipation.  No nausea, vomiting, diarrhea.   Genitourinary:  No dysuria but has urinary retention and frequency.  Desmopressin helps nocturia. Musculoskeletal:  No neck pain,   Has lower back pain and buttocks.  Hips ans other joints ok Integumentary: No rash, pruritus, skin lesions Neurological: as above Psychiatric: see above Endocrine: No palpitations, diaphoresis, change in appetite, change in weigh or increased thirst Hematologic/Lymphatic:  No anemia, purpura, petechiae. Allergic/Immunologic: No itchy/runny eyes, nasal congestion, recent allergic reactions, rashes  ALLERGIES: Allergies  Allergen Reactions  . Codeine Nausea And Vomiting    HOME MEDICATIONS: Outpatient Medications Prior to Visit  Medication Sig Dispense Refill  . acetaminophen (TYLENOL) 500 MG tablet Take 1,000 mg by mouth every 6 (six) hours as needed for headache (headache).    . ALPRAZolam (XANAX) 0.5 MG tablet Take 0.5 mg by  mouth daily as needed for anxiety (anxiety).     . clonazePAM (KLONOPIN) 1 MG tablet Take 1 tablet (1 mg total) by mouth at bedtime as needed for anxiety (sleep). 30 tablet 5  . desmopressin (DDAVP) 0.1 MG tablet Take 1 tablet (0.1 mg total) by mouth at bedtime as needed (bladder.). 30 tablet 11  . doxepin (SINEQUAN) 10 MG capsule TAKE 1 CAPSULE(10 MG) BY MOUTH AT BEDTIME AS NEEDED 90 capsule 3  . estradiol (CLIMARA - DOSED IN MG/24 HR) 0.075 mg/24hr patch Place 0.075 mg onto the skin once a week. Put on Saturdays.  10  . gabapentin (NEURONTIN) 800 MG tablet Take 1 tablet (800 mg total) by mouth 4 (four) times daily. 120 tablet  11  . ibuprofen (ADVIL,MOTRIN) 200 MG tablet Take 400 mg by mouth every 6 (six) hours as needed for headache (headache).    . meclizine (ANTIVERT) 25 MG tablet Take 1 tablet (25 mg total) by mouth 3 (three) times daily as needed for dizziness. 30 tablet 0  . ondansetron (ZOFRAN) 4 MG tablet Take 1 tablet (4 mg total) by mouth every 8 (eight) hours as needed for nausea or vomiting. 12 tablet 0  . promethazine (PHENERGAN) 25 MG tablet Take 1 tablet (25 mg total) by mouth every 6 (six) hours as needed for nausea (nausea). 30 tablet 3  . traMADol (ULTRAM) 50 MG tablet Take 1 tablet (50 mg total) by mouth 3 (three) times daily as needed for moderate pain (pain). 90 tablet 5  . zolpidem (AMBIEN) 10 MG tablet Take 1 tablet (10 mg total) by mouth at bedtime as needed for sleep (sleep). 30 tablet 5  . amphetamine-dextroamphetamine (ADDERALL) 20 MG tablet Take 1 tablet (20 mg total) by mouth 2 (two) times daily. 60 tablet 0  . diazepam (VALIUM) 5 MG tablet TAKE 1 TABLET BY MOUTH EVERY 6 HOURS AS NEEDED FOR ANXIETY 90 tablet 3  . lamoTRIgine (LAMICTAL) 200 MG tablet Take 1 tablet (200 mg total) by mouth 3 (three) times daily. 90 tablet 11  . tapentadol (NUCYNTA) 50 MG tablet Take 1 tablet (50 mg total) by mouth 3 (three) times daily as needed for moderate pain or severe pain (pain). 90  tablet 0  . furosemide (LASIX) 20 MG tablet 1/2 to 1 pill po every mornig (Patient not taking: Reported on 07/21/2016) 30 tablet 11  . methylPREDNISolone (MEDROL DOSEPAK) 4 MG TBPK tablet Take 6 tablets on day 1, 5 tablets on day 2, 4 tablets on day 3, 3 tablets on day 4, 2 talets on day 5, and 1 tablet on day 6 (Patient not taking: Reported on 07/21/2016) 21 tablet 0  . ocrelizumab 600 mg in sodium chloride 0.9 % 500 mL Inject 600 mg into the vein every 6 (six) months.    . Cholecalciferol (VITAMIN D PO) Take 1 tablet by mouth every morning.     No facility-administered medications prior to visit.     PAST MEDICAL HISTORY: Past Medical History:  Diagnosis Date  . Anxiety   . Depression   . Gait instability    uses wheelchair or assistance  . H/O steroid therapy    IV infusion every 6 to 8 weeks- last 05-31-14 [facility]  . History of breast biopsy   . Multiple sclerosis ([place])   . Neuromuscular disorder ([place])    MS  . PONV (postoperative nausea and vomiting)   . Skin cancer     PAST SURGICAL HISTORY: Past Surgical History:  Procedure Laterality Date  . ABDOMINAL HYSTERECTOMY    . BREAST BIOPSY Right 09/20/2012   Procedure: BREAST BIOPSY WITH NEEDLE LOCALIZATION;  Surgeon: Rolm Bookbinder, MD;  Location: [facility];  Service: General;  Laterality: Right;  . CESAREAN SECTION    . COLONOSCOPY WITH PROPOFOL N/A 07/13/2014   Procedure: COLONOSCOPY WITH PROPOFOL;  Surgeon: Beryle Beams, MD;  Location: [facility];  Service: Endoscopy;  Laterality: N/A;  . DEBRIDEMENT SKIN / SQ / MUSCLE OF ARM Right 07/13/2003   I & D; debridement of tissue within triceps muscle  . LEG SURGERY     sx as a child  . MELANOMA EXCISION    . PARTIAL HYSTERECTOMY    . TONSILLECTOMY  as a child    FAMILY HISTORY: Family History  Problem Relation Age of Onset  . Hyperlipidemia Mother   . Hypertension Mother   . Hypertension Father   . Diabetes type II Father      SOCIAL HISTORY:  Social History   Social History  . Marital status: Married    Spouse name: N/A  . Number of children: N/A  . Years of education: N/A   Occupational History  . Not on file.   Social History Main Topics  . Smoking status: Never Smoker  . Smokeless tobacco: Never Used  . Alcohol use Yes     Comment: social  . Drug use: No  . Sexual activity: Not on file   Other Topics Concern  . Not on file   Social History Narrative  . No narrative on file     PHYSICAL EXAM  Vitals:   07/21/16 1520  BP: 122/76  Pulse: 64  Resp: 18  Weight: 136 lb (61.7 kg)  Height: 5\' 5"  (1.651 m)    Body mass index is 22.63 kg/m.   General: The patient is well-developed and well-nourished and in no acute distress  HEENT:   [place]/AT,   Oropharynx was normal.  Skin/Ext: Mild pedal edema      Neurologic Exam  Mental status: The patient is alert and oriented x 3 at the time of the examination. The patient has apparent normal recent and remote memory, with an apparently normal attention span and concentration ability.   Speech is normal.  Cranial nerves: Extraocular movements are full. There is good facial sensation to soft touch bilaterally.Facial strength is normal.  Trapezius and sternocleidomastoid strength is normal. No dysarthria is noted.  No obvious hearing deficits are noted.  Motor:  Muscle bulk and tone are normal. Strength is  3 / 5 in proximal legs and 4/5 distally, worse on left. Left and right arm are similar.    She uses arms to get out of chair  Sensory: Sensory testing is intact to soft touch, vibration sensation in arms but mild decreased touch in left leg.   .  Coordination: Cerebellar testing reveals reduced finger-nose-finger and poor heel-to-shin bilaterally.  Gait and station: She needs to use arms to stand.    Station is unsteady and gait requires bilateral support.  She can not tandem.   She transfers independently  Reflexes: Deep tendon reflexes  are symmetric and increased in legs bilaterally with spread at the knees.Nonsustained ankle clonus bilaterally.     DIAGNOSTIC DATA (LABS, IMAGING, TESTING) - I reviewed patient records, labs, notes, testing and imaging myself where available.  Lab Results  Component Value Date   WBC 7.4 10/01/2015   HGB 12.5 08/17/2014   HCT 38.4 10/01/2015   MCV 93 10/01/2015   PLT 220 10/01/2015      Component Value Date/Time   NA 137 08/18/2014 0657   K 4.0 08/18/2014 0657   CL 105 08/18/2014 0657   CO2 28 08/18/2014 0657   GLUCOSE 91 08/18/2014 0657   BUN 7 08/18/2014 0657   CREATININE 0.43 (L) 08/18/2014 0657   CALCIUM 8.1 (L) 08/18/2014 0657   PROT 6.6 10/01/2015 1351   ALBUMIN 4.5 10/01/2015 1351   AST 11 10/01/2015 1351   ALT 11 10/01/2015 1351   ALKPHOS 62 10/01/2015 1351   BILITOT 0.4 10/01/2015 1351   GFRNONAA >90 08/18/2014 0657   GFRAA >90 08/18/2014 0657      ASSESSMENT AND PLAN  Multiple sclerosis (HCC)  Ataxic gait  Chronic fatigue  Urinary dysfunction  Chronic insomnia  Depression with anxiety  Dysesthesia  Benign paroxysmal positional vertigo of left ear   1.   Continue ocrelizumab.   We will re-enroll  2..   For the dysesthesias change lamotrigine to oxcarbazepine.  3.   Continue other medications 4.  She will return to see me in 4 months or sooner if she has new or worsening neurologic symptoms.  40 minute face to face with > 1/2 the time counseling or coordinating care about her MS and related symptoms.  Richard A. Felecia Shelling, MD, PhD [id number], [id number] PM Certified in Neurology, Clinical Neurophysiology, Sleep Medicine, Pain Medicine and Neuroimaging  [facility] [address], [place], [place] [address] [phone] /

## 2016-07-30 ENCOUNTER — Telehealth: Payer: Self-pay | Admitting: Neurology

## 2016-07-30 MED ORDER — PREGABALIN 300 MG PO CAPS: 300.0000 mg | ORAL_CAPSULE | Freq: Two times a day (BID) | ORAL | 5 refills | Status: DC

## 2016-07-30 MED ORDER — PREGABALIN 150 MG PO CAPS: 150.0000 mg | ORAL_CAPSULE | Freq: Two times a day (BID) | ORAL | 5 refills | Status: DC

## 2016-07-30 NOTE — Telephone Encounter (Signed)
Patient called in reference to OXcarbazepine (TRILEPTAL) 150 MG tablet.  Patient states she was out of it and felt "crazy", didn't help with pain at all. Patient is requesting to see if there is anything else she can take.  Please call

## 2016-07-30 NOTE — Telephone Encounter (Signed)
Per RAS, ok to stop Trileptal, start Lyrica 300mg  po bid.  I have spoken with Gwenevere and she is agreeable with this.  Rx. faxed to [facility] on [address] per her request/fim

## 2016-07-30 NOTE — Telephone Encounter (Signed)
I have spoken with Ciona this afternoon.  She sts. Trileptal is not helping dysesthesias and is making her feel crazy.  Would like other options.  Will check with RAS and call her back.  She is taking Gabapentin as well.  Has not been able to tolerate Lamictal/fim

## 2016-07-30 NOTE — Addendum Note (Signed)
Addended by: France Ravens I on: 07/30/2016 05:12 PM   Modules accepted: Orders

## 2016-07-30 NOTE — Addendum Note (Signed)
Addended by: France Ravens I on: 07/30/2016 05:32 PM   Modules accepted: Orders

## 2016-08-11 DIAGNOSIS — G35 Multiple sclerosis: Secondary | ICD-10-CM | POA: Diagnosis not present

## 2016-08-18 ENCOUNTER — Telehealth: Payer: Self-pay | Admitting: Neurology

## 2016-08-18 MED ORDER — ZOLPIDEM TARTRATE 10 MG PO TABS: 10.0000 mg | ORAL_TABLET | Freq: Every evening | ORAL | 5 refills | Status: DC | PRN

## 2016-08-18 NOTE — Telephone Encounter (Signed)
Rx. awaiting RAS sig/fim 

## 2016-08-18 NOTE — Telephone Encounter (Signed)
Patient requesting refill of zolpidem (AMBIEN) 10 MG tablet

## 2016-08-18 NOTE — Telephone Encounter (Signed)
I have spoken with Tonya Powers this afternoon, and faxed Ambien rx. to [facility] per her request/fim

## 2016-08-18 NOTE — Addendum Note (Signed)
Addended by: France Ravens I on: 08/18/2016 02:17 PM   Modules accepted: Orders

## 2016-09-15 DIAGNOSIS — R42 Dizziness and giddiness: Secondary | ICD-10-CM | POA: Diagnosis not present

## 2016-09-15 DIAGNOSIS — G35 Multiple sclerosis: Secondary | ICD-10-CM | POA: Diagnosis not present

## 2016-09-21 ENCOUNTER — Other Ambulatory Visit: Payer: Self-pay | Admitting: Neurology

## 2016-09-22 ENCOUNTER — Telehealth: Payer: Self-pay | Admitting: Neurology

## 2016-09-22 DIAGNOSIS — G35 Multiple sclerosis: Secondary | ICD-10-CM | POA: Diagnosis not present

## 2016-09-22 MED ORDER — AMPHETAMINE-DEXTROAMPHETAMINE 20 MG PO TABS: 20.0000 mg | ORAL_TABLET | Freq: Two times a day (BID) | ORAL | 0 refills | Status: DC

## 2016-09-22 NOTE — Telephone Encounter (Signed)
Per RAS, ok for SM 1gm IV once.  After speaking with Mindy, I have spoken with Lachandra and given appt. 1-1:30pm today.  Pt. requests r/f of Ritalin as well, so will give that rx. directly to her at infusion appt/fim

## 2016-09-22 NOTE — Addendum Note (Signed)
Addended by: France Ravens I on: 09/22/2016 12:26 PM   Modules accepted: Orders

## 2016-09-22 NOTE — Telephone Encounter (Signed)
Correction--pt. requested Adderall r/f.  Rx. printed, signed, and will give directly to pt. during infusion appt/fim

## 2016-09-22 NOTE — Telephone Encounter (Signed)
Patient called office stating she is having not strength at all, unable to transfer and pain level is back up.  Patient requesting to have an infusion.  Please call

## 2016-10-01 ENCOUNTER — Telehealth: Payer: Self-pay | Admitting: Neurology

## 2016-10-01 DIAGNOSIS — L718 Other rosacea: Secondary | ICD-10-CM | POA: Diagnosis not present

## 2016-10-01 DIAGNOSIS — Z85828 Personal history of other malignant neoplasm of skin: Secondary | ICD-10-CM | POA: Diagnosis not present

## 2016-10-01 DIAGNOSIS — L570 Actinic keratosis: Secondary | ICD-10-CM | POA: Diagnosis not present

## 2016-10-01 NOTE — Telephone Encounter (Signed)
I have spoken with Tonya Powers today.  She c/o blurry vision off/on over the last couple of days.  She can clear vision by manipulating eyes (blinking, pulling eyelid up).  No eye pain. Recently had skin cancers removed above left eye, but blurry vision started prior to that. No new meds.  Bright sunlight makes vision blurrier, but does not cause pain. Not sure if eyes are dry.  Will check with RAS when he returns to the office tomorrow, then call pt. back/fim

## 2016-10-01 NOTE — Telephone Encounter (Signed)
Pt said she is having problem with her sight. She has to continue blinking her eyes to be able to see. Please call

## 2016-10-02 NOTE — Telephone Encounter (Signed)
I have spoken with Tonya Powers this morning, and per RAS, advised that, since vision clears with manipulating eyes, blinking, and there is no eye pain, this is not likely related to MS.  She verbalized understanding of same, sts. so far this morning, vision is clear.  She will call back if needed/fim

## 2016-10-09 ENCOUNTER — Other Ambulatory Visit: Payer: Self-pay | Admitting: Neurology

## 2016-10-20 ENCOUNTER — Ambulatory Visit (INDEPENDENT_AMBULATORY_CARE_PROVIDER_SITE_OTHER): Payer: Medicare Other | Admitting: Nurse Practitioner

## 2016-10-20 ENCOUNTER — Telehealth: Payer: Self-pay | Admitting: Neurology

## 2016-10-20 ENCOUNTER — Encounter: Payer: Self-pay | Admitting: Nurse Practitioner

## 2016-10-20 VITALS — BP 101/64

## 2016-10-20 DIAGNOSIS — R260 Ataxic gait: Secondary | ICD-10-CM | POA: Diagnosis not present

## 2016-10-20 DIAGNOSIS — G35 Multiple sclerosis: Secondary | ICD-10-CM

## 2016-10-20 DIAGNOSIS — R208 Other disturbances of skin sensation: Secondary | ICD-10-CM | POA: Diagnosis not present

## 2016-10-20 MED ORDER — CLONAZEPAM 1 MG PO TABS: ORAL_TABLET | ORAL | 5 refills | Status: DC

## 2016-10-20 MED ORDER — CARBAMAZEPINE ER 200 MG PO TB12: 200.0000 mg | ORAL_TABLET | Freq: Two times a day (BID) | ORAL | 3 refills | Status: DC

## 2016-10-20 MED ORDER — AMPHETAMINE-DEXTROAMPHETAMINE 20 MG PO TABS: 20.0000 mg | ORAL_TABLET | Freq: Two times a day (BID) | ORAL | 0 refills | Status: DC

## 2016-10-20 NOTE — Progress Notes (Signed)
I have read the note, and I agree with the clinical assessment and plan.  WILLIS,CHARLES KEITH   

## 2016-10-20 NOTE — Telephone Encounter (Signed)
Patient called office in reference to burning pain with MS.  Patient would like to see if she is able to begin tegretol.  Please call

## 2016-10-20 NOTE — Patient Instructions (Signed)
Try Tegretol for the dysesthesias twice daily     Continue other medications  She will return to see Dr. Felecia Shelling as planned

## 2016-10-20 NOTE — Progress Notes (Signed)
[facility]  PATIENT: Tonya Powers DOB: 1960-10-17   REASON FOR VISIT: Follow-up for increased neuropathic pain, history of MS HISTORY FROM: Patient    HISTORY OF PRESENT ILLNESS:UPDATE 10/20/2016.CM  Ms Gonzalo, is seen on an urgent basis for neuropathic pain. She is a  56 year old woman with MS.  She reports pain  in the right flank and the hip and buttock regions and sometimes down her leg.  Spasticity is painful .   Tramadol does not help nearly as much.  She takes gabapentin 800 mg 3-4 a day.   she has tried Lamictal in the past    and more recently oxcarbazepine.  She has done some research and wants to try Tegretol. She has some dizziness at times   She was on Tysabri and was more stable but converted to JCV Ab positive and stopped.   She could not tolerate Tecfidera.    She had Ocrelizumab in May / June and tolerated the infusions well.  However, two months later, she had more neurologic symptoms and she decided to stop the Ocrelizumab.    She never resumed any other medications.  She has poor gait and  uses a scooter mostly now.   Bladder function is poor and she has a lot of hesitancy.    She has to wear pads and has incontinence at night.   Fatigue is present most days.   Adderall helps some.   Heat makes it worse.   She has both sleep onset and sleep maintenance insomnia.   She takes clonazepam 1 mg and Ambien every night due to difficulty falling and staying asleep. Clonazepam also helps her leg spasms as well as helps her to fall asleep.   She notes mild depression and anxiety.    She prefers not to take an antidepressant.    She notes more trouble with cognition, especially short term memory.    Adderall helps focus some   REVIEW OF SYSTEMS: Full 14 system review of systems performed and notable only for those listed, all others are neg:  Constitutional: neg  Cardiovascular: neg Ear/Nose/Throat: neg  Skin: neg Eyes: neg Respiratory:  neg Gastroitestinal: neg  Hematology/Lymphatic: neg  Endocrine: neg Musculoskeletal:neg Allergy/Immunology: neg Neurological:  neuropathic pain Psychiatric  neg Sleep : neg   ALLERGIES: Allergies  Allergen Reactions  . Codeine Nausea And Vomiting    HOME MEDICATIONS: Outpatient Medications Prior to Visit  Medication Sig Dispense Refill  . acetaminophen (TYLENOL) 500 MG tablet Take 1,000 mg by mouth every 6 (six) hours as needed for headache (headache).    . ALPRAZolam (XANAX) 0.5 MG tablet Take 0.5 mg by mouth daily as needed for anxiety (anxiety).     Marland Kitchen desmopressin (DDAVP) 0.1 MG tablet Take 1 tablet (0.1 mg total) by mouth at bedtime as needed (bladder.). 30 tablet 11  . diazepam (VALIUM) 5 MG tablet Take 1 tablet (5 mg total) by mouth every 8 (eight) hours as needed. for anxiety 90 tablet 3  . doxepin (SINEQUAN) 10 MG capsule TAKE 1 CAPSULE(10 MG) BY MOUTH AT BEDTIME AS NEEDED 90 capsule 3  . estradiol (CLIMARA - DOSED IN MG/24 HR) 0.075 mg/24hr patch Place 0.075 mg onto the skin once a week. Put on Saturdays.  10  . gabapentin (NEURONTIN) 800 MG tablet Take 1 tablet (800 mg total) by mouth 4 (four) times daily. 120 tablet 11  . ibuprofen (ADVIL,MOTRIN) 200 MG tablet Take 400 mg by mouth every 6 (six) hours as needed  for headache (headache).    . meclizine (ANTIVERT) 25 MG tablet Take 1 tablet (25 mg total) by mouth 3 (three) times daily as needed for dizziness. 30 tablet 0  . ocrelizumab 600 mg in sodium chloride 0.9 % 500 mL Inject 600 mg into the vein every 6 (six) months.    . ondansetron (ZOFRAN) 4 MG tablet Take 1 tablet (4 mg total) by mouth every 8 (eight) hours as needed for nausea or vomiting. 12 tablet 0  . promethazine (PHENERGAN) 25 MG tablet Take 1 tablet (25 mg total) by mouth every 6 (six) hours as needed for nausea (nausea). 30 tablet 3  . tapentadol (NUCYNTA) 50 MG tablet Take 1 tablet (50 mg total) by mouth 3 (three) times daily as needed for moderate pain or  severe pain (pain). 90 tablet 0  . traMADol (ULTRAM) 50 MG tablet TAKE 1 TABLET BY MOUTH THREE TIMES DAILY AS NEEDED FOR MODERATE PAIN 90 tablet 0  . zolpidem (AMBIEN) 10 MG tablet Take 1 tablet (10 mg total) by mouth at bedtime as needed for sleep (sleep). 30 tablet 5  . amphetamine-dextroamphetamine (ADDERALL) 20 MG tablet Take 1 tablet (20 mg total) by mouth 2 (two) times daily. 60 tablet 0  . clonazePAM (KLONOPIN) 1 MG tablet TAKE 1 TABLET BY MOUTH EVERY NIGHT AT BEDTIME AS NEEDED FOR ANXIETY 30 tablet 0  . furosemide (LASIX) 20 MG tablet 1/2 to 1 pill po every mornig (Patient not taking: Reported on 07/21/2016) 30 tablet 11  . methylPREDNISolone (MEDROL DOSEPAK) 4 MG TBPK tablet Take 6 tablets on day 1, 5 tablets on day 2, 4 tablets on day 3, 3 tablets on day 4, 2 talets on day 5, and 1 tablet on day 6 (Patient not taking: Reported on 07/21/2016) 21 tablet 0  . pregabalin (LYRICA) 300 MG capsule Take 1 capsule (300 mg total) by mouth 2 (two) times daily. (Patient not taking: Reported on 10/20/2016) 605 capsule 5   No facility-administered medications prior to visit.     PAST MEDICAL HISTORY: Past Medical History:  Diagnosis Date  . Anxiety   . Depression   . Gait instability    uses wheelchair or assistance  . H/O steroid therapy    IV infusion every 6 to 8 weeks- last 05-31-14 [facility]  . History of breast biopsy   . Multiple sclerosis ([place])   . Neuromuscular disorder ([place])    MS  . PONV (postoperative nausea and vomiting)   . Skin cancer     PAST SURGICAL HISTORY: Past Surgical History:  Procedure Laterality Date  . ABDOMINAL HYSTERECTOMY    . BREAST BIOPSY Right 09/20/2012   Procedure: BREAST BIOPSY WITH NEEDLE LOCALIZATION;  Surgeon: Rolm Bookbinder, MD;  Location: [facility];  Service: General;  Laterality: Right;  . CESAREAN SECTION    . COLONOSCOPY WITH PROPOFOL N/A 07/13/2014   Procedure: COLONOSCOPY WITH PROPOFOL;  Surgeon: Beryle Beams,  MD;  Location: [facility];  Service: Endoscopy;  Laterality: N/A;  . DEBRIDEMENT SKIN / SQ / MUSCLE OF ARM Right 07/13/2003   I & D; debridement of tissue within triceps muscle  . LEG SURGERY     sx as a child  . MELANOMA EXCISION    . PARTIAL HYSTERECTOMY    . TONSILLECTOMY     as a child    FAMILY HISTORY: Family History  Problem Relation Age of Onset  . Hyperlipidemia Mother   . Hypertension Mother   . Hypertension Father   .  Diabetes type II Father     SOCIAL HISTORY: Social History   Social History  . Marital status: Married    Spouse name: N/A  . Number of children: N/A  . Years of education: N/A   Occupational History  . Not on file.   Social History Main Topics  . Smoking status: Never Smoker  . Smokeless tobacco: Never Used  . Alcohol use Yes     Comment: social  . Drug use: No  . Sexual activity: Not on file   Other Topics Concern  . Not on file   Social History Narrative  . No narrative on file     PHYSICAL EXAM VS101/64. P73   Generalized: Well developed, in no acute distress  Head: normocephalic and atraumatic,. Oropharynx benign  Skin mild pedal edema  Musculoskeletal: No deformity   Neurological examination   Mentation: Alert oriented to time, place, history taking. Attention span and concentration appropriate. Recent and remote memory intact.  Follows all commands speech and language fluent.   Cranial nerve II-XII: Pupils were equal round reactive to light extraocular movements were full, visual field were full on confrontational test. Facial sensation and strength were normal. hearing was intact to finger rubbing bilaterally. Uvula tongue midline. head turning and shoulder shrug were normal and symmetric.Tongue protrusion into cheek strength was normal. Motor: normal bulk and tone,  3 out of 4 proximally in the legs and 4 out of 5 distally worse on the left  Sensory: normal and symmetric to light touch in the arms mildly decreased in the  left leg Coordination: finger-nose-finger,  Poor  heel-to-shin Reflexes: Deep tendon reflexes are symmetric and increased in legs bilaterally with spread at the knees.Nonsustained ankle clonus bilaterally Gait and Station Not ambulated  DATA (LABS, IMAGING, TESTING) - I reviewed patient records, labs, notes, testing and imaging myself where available.  Lab Results  Component Value Date   WBC 7.4 10/01/2015   HGB 12.5 08/17/2014   HCT 38.4 10/01/2015   MCV 93 10/01/2015   PLT 220 10/01/2015      Component Value Date/Time   NA 137 08/18/2014 0657   K 4.0 08/18/2014 0657   CL 105 08/18/2014 0657   CO2 28 08/18/2014 0657   GLUCOSE 91 08/18/2014 0657   BUN 7 08/18/2014 0657   CREATININE 0.43 (L) 08/18/2014 0657   CALCIUM 8.1 (L) 08/18/2014 0657   PROT 6.6 10/01/2015 1351   ALBUMIN 4.5 10/01/2015 1351   AST 11 10/01/2015 1351   ALT 11 10/01/2015 1351   ALKPHOS 62 10/01/2015 1351   BILITOT 0.4 10/01/2015 1351   GFRNONAA >90 08/18/2014 0657   GFRAA >90 08/18/2014 0657    ASSESSMENT AND PLAN  56 y.o. year old female  has a past medical history of  Gait instability;  Multiple sclerosis ([place]); Neuromuscular disorder ([place]); and painful dysesthesias here to follow-up.The patient is a current patient of Dr. Felecia Shelling  who is out of the office today . This note is sent to the work in doctor.     PLAN Try Tegretol for the dysesthesias twice daily     Continue other medications  She will return to see Dr. Felecia Shelling as planned I spent 25 min  in total face to face time with the patient more than 50% of which was spent counseling and coordination of care, reviewing test results reviewing medications , reviewing chart and discussing and reviewing the diagnosis of painful dysesthesias and further treatment options.   Dennie Bible, GNP,  [facility], APRN  [facility] [address], [place], [place] [address] [phone]

## 2016-10-20 NOTE — Telephone Encounter (Signed)
I have spoken with Chauna this afternoon. She c/o increased burning pain for 2 days.  Has not found a med yet that provides much relief. Would like to discuss starting Tegretol.  RAS is ooo this wk.  Appt. given with Cecille Rubin, NP/fim

## 2016-11-02 DIAGNOSIS — H8110 Benign paroxysmal vertigo, unspecified ear: Secondary | ICD-10-CM | POA: Diagnosis not present

## 2016-11-08 ENCOUNTER — Other Ambulatory Visit: Payer: Self-pay | Admitting: Neurology

## 2016-11-10 ENCOUNTER — Telehealth: Payer: Self-pay | Admitting: *Deleted

## 2016-11-10 MED ORDER — TRAMADOL HCL 50 MG PO TABS
ORAL_TABLET | ORAL | 0 refills | Status: DC
Start: 2016-11-10 — End: 2016-11-19

## 2016-11-10 NOTE — Telephone Encounter (Signed)
Tramadol faxed to [facility] per faxed request/fim

## 2016-11-19 ENCOUNTER — Encounter: Payer: Self-pay | Admitting: Neurology

## 2016-11-19 ENCOUNTER — Ambulatory Visit (INDEPENDENT_AMBULATORY_CARE_PROVIDER_SITE_OTHER): Payer: Medicare Other | Admitting: Neurology

## 2016-11-19 VITALS — BP 122/79 | HR 81 | Resp 16 | Ht 65.0 in | Wt 136.0 lb

## 2016-11-19 DIAGNOSIS — R208 Other disturbances of skin sensation: Secondary | ICD-10-CM | POA: Diagnosis not present

## 2016-11-19 DIAGNOSIS — M7061 Trochanteric bursitis, right hip: Secondary | ICD-10-CM | POA: Diagnosis not present

## 2016-11-19 DIAGNOSIS — H8112 Benign paroxysmal vertigo, left ear: Secondary | ICD-10-CM | POA: Diagnosis not present

## 2016-11-19 DIAGNOSIS — M5431 Sciatica, right side: Secondary | ICD-10-CM

## 2016-11-19 DIAGNOSIS — G35 Multiple sclerosis: Secondary | ICD-10-CM | POA: Diagnosis not present

## 2016-11-19 MED ORDER — AMPHETAMINE-DEXTROAMPHETAMINE 20 MG PO TABS: 20.0000 mg | ORAL_TABLET | Freq: Two times a day (BID) | ORAL | 0 refills | Status: DC

## 2016-11-19 MED ORDER — TRAMADOL HCL 50 MG PO TABS: ORAL_TABLET | ORAL | 5 refills | Status: DC

## 2016-11-19 NOTE — Progress Notes (Signed)
[facility]  PATIENT: Tonya Powers DOB: 16-Jun-1961  REFERRING CLINICIAN: Mayra Neer is PCP HISTORY FROM: Patient  REASON FOR VISIT: MS and poor gait   HISTORICAL  CHIEF COMPLAINT:  Chief Complaint  Patient presents with  . Multiple Sclerosis    Sts. she tolerated last Ocrevus infusioin well.  Sts. she is able to stand straighter, take a few steps unassisted. Sts. burning pain was no better with Tegretol, so she stopped it and restarted Lamictal 200 bid/fim    HISTORY OF PRESENT ILLNESS:  Tonya Powers is a 56 year old woman with MS.      MS:   She is back on the Ocrevus and tolerates it well and feels her MS is stable.    She was on Tysabri and was stable but converted to JCV Ab positive and stopped.   She could not tolerate Tecfidera.   When feeling worse     Gait/strength :    She has poor gait and has difficulty .    She uses a scooter mostly now. She uses a walker some every day and can go up to 30-40 feet.    Therefore, she mostly uses her scooter.  IV steroids did not help in August..     She has left > right leg weakness and spasticity.       Dysesthesia/hip pain:   She has a burning pain in her right flank, hip area and down the right leg.  Also deeper right hip pain.    Lamotrigine only helps slightly.    Tegretol and gabapentin have not helped much.   TCA's have not helped.     Baclofen made her feel weaker and she stopped.     Nucynta is too expensive.   Tramadol has not helped much   Bladder/bowel:  Bladder function is poor and she has a lot of hesitancy.    She has to wear pads and has incontinence at night.    Desmopressin used to help more at night than now.   She reports severe constipation. Linzess did not help.    Nucynta does not worsen constipation like opiates did but is very expensive  Fatigue/sleep:   Fatigue is present most days.   Adderall helps some.   Heat makes it worse.   She does well dping pool exercises.,  When fatiue is more  severe she takes  Solu-Medrol infusions.   She has both sleep onset and sleep maintenance insomnia.   She takes clonazepam 1 mg and Ambien every night due to difficulty falling and staying asleep. Clonazepam also helps her leg spasms as well as helps her to fall asleep.   Sleep was initially better afterbeing placed on desmopressin for her bladder but now less certain  Mood/cognition:   She notes mild depression and anxiety.    She prefers not to take an antidepressant.    She rarely takes Xanax 0.5 mg when she has more anxiety. She notes more trouble with cognition, especially short term memory.    Adderall helps focus some    MS History:  She presented with optic neuritis in 1991 followed shortly by difficulties with leg weakness and by right trigeminal neuralgia. In retrospect, couple years earlier when she had her daughter, has some difficulties with her legs and needed to go on short-term disability. At first she was not diagnosed with MS but after more of the symptoms she underwent MRI testing and had a lumbar puncture by Dr. Johnnye Sima. The imaging  and the CSF was consistent with multiple sclerosis. When Betaseron became available she was placed on that. She was on Betaseron for about 10 years. She felt that she did not have too many exacerbations during that time but she had a lot of difficulty tolerating the Betaseron due to skin reactions. Around 12-15 years ago, she started to use a cane and she has had progressive gait disturbance over the last decade. Around the house for short distances, she uses a walker but uses her scooter for longer distances. Outside she uses a wheelchair pushed by others   REVIEW OF SYSTEMS:  Constitutional: No fevers, chills, sweats, or change in appetite.  She has Fatigue Eyes: No visual changes, double vision, eye pain Ear, nose and throat: No hearing loss, ear pain, nasal congestion, sore throat Cardiovascular: No chest pain, palpitations Respiratory:  No shortness  of breath at rest or with exertion.   No wheezes GastrointestinaI: Mild dysphagia.  Constipation.  No nausea, vomiting, diarrhea.   Genitourinary:  No dysuria but has urinary retention and frequency.  Desmopressin helps nocturia. Musculoskeletal:  No neck pain,   Has lower back pain and buttocks.  Hips ans other joints ok Integumentary: No rash, pruritus, skin lesions Neurological: as above Psychiatric: see above Endocrine: No palpitations, diaphoresis, change in appetite, change in weigh or increased thirst Hematologic/Lymphatic:  No anemia, purpura, petechiae. Allergic/Immunologic: No itchy/runny eyes, nasal congestion, recent allergic reactions, rashes  ALLERGIES: Allergies  Allergen Reactions  . Codeine Nausea And Vomiting    HOME MEDICATIONS: Outpatient Medications Prior to Visit  Medication Sig Dispense Refill  . acetaminophen (TYLENOL) 500 MG tablet Take 1,000 mg by mouth every 6 (six) hours as needed for headache (headache).    . ALPRAZolam (XANAX) 0.5 MG tablet Take 0.5 mg by mouth daily as needed for anxiety (anxiety).     . clonazePAM (KLONOPIN) 1 MG tablet TAKE 1 TABLET BY MOUTH EVERY NIGHT AT BEDTIME AS NEEDED FOR ANXIETY 30 tablet 5  . desmopressin (DDAVP) 0.1 MG tablet Take 1 tablet (0.1 mg total) by mouth at bedtime as needed (bladder.). 30 tablet 11  . diazepam (VALIUM) 5 MG tablet Take 1 tablet (5 mg total) by mouth every 8 (eight) hours as needed. for anxiety 90 tablet 3  . doxepin (SINEQUAN) 10 MG capsule TAKE 1 CAPSULE(10 MG) BY MOUTH AT BEDTIME AS NEEDED 90 capsule 3  . estradiol (CLIMARA - DOSED IN MG/24 HR) 0.075 mg/24hr patch Place 0.075 mg onto the skin once a week. Put on Saturdays.  10  . gabapentin (NEURONTIN) 800 MG tablet Take 1 tablet (800 mg total) by mouth 4 (four) times daily. 120 tablet 11  . ibuprofen (ADVIL,MOTRIN) 200 MG tablet Take 400 mg by mouth every 6 (six) hours as needed for headache (headache).    . meclizine (ANTIVERT) 25 MG tablet Take 1  tablet (25 mg total) by mouth 3 (three) times daily as needed for dizziness. 30 tablet 0  . ocrelizumab 600 mg in sodium chloride 0.9 % 500 mL Inject 600 mg into the vein every 6 (six) months.    . ondansetron (ZOFRAN) 4 MG tablet Take 1 tablet (4 mg total) by mouth every 8 (eight) hours as needed for nausea or vomiting. 12 tablet 0  . promethazine (PHENERGAN) 25 MG tablet Take 1 tablet (25 mg total) by mouth every 6 (six) hours as needed for nausea (nausea). 30 tablet 3  . tapentadol (NUCYNTA) 50 MG tablet Take 1 tablet (50 mg total) by mouth  3 (three) times daily as needed for moderate pain or severe pain (pain). 90 tablet 0  . zolpidem (AMBIEN) 10 MG tablet Take 1 tablet (10 mg total) by mouth at bedtime as needed for sleep (sleep). 30 tablet 5  . amphetamine-dextroamphetamine (ADDERALL) 20 MG tablet Take 1 tablet (20 mg total) by mouth 2 (two) times daily. 60 tablet 0  . traMADol (ULTRAM) 50 MG tablet TAKE 1 TABLET BY MOUTH THREE TIMES DAILY AS NEEDED FOR MODERATE PAIN 90 tablet 0  . carbamazepine (TEGRETOL XR) 200 MG 12 hr tablet Take 1 tablet (200 mg total) by mouth 2 (two) times daily. (Patient not taking: Reported on 11/19/2016) 60 tablet 3  . furosemide (LASIX) 20 MG tablet 1/2 to 1 pill po every mornig (Patient not taking: Reported on 11/19/2016) 30 tablet 11  . methylPREDNISolone (MEDROL DOSEPAK) 4 MG TBPK tablet Take 6 tablets on day 1, 5 tablets on day 2, 4 tablets on day 3, 3 tablets on day 4, 2 talets on day 5, and 1 tablet on day 6 (Patient not taking: Reported on 07/21/2016) 21 tablet 0  . pregabalin (LYRICA) 300 MG capsule Take 1 capsule (300 mg total) by mouth 2 (two) times daily. (Patient not taking: Reported on 10/20/2016) 605 capsule 5   No facility-administered medications prior to visit.     PAST MEDICAL HISTORY: Past Medical History:  Diagnosis Date  . Anxiety   . Depression   . Gait instability    uses wheelchair or assistance  . H/O steroid therapy    IV infusion every  6 to 8 weeks- last 05-31-14 [facility]  . History of breast biopsy   . Multiple sclerosis ([place])   . Neuromuscular disorder ([place])    MS  . PONV (postoperative nausea and vomiting)   . Skin cancer     PAST SURGICAL HISTORY: Past Surgical History:  Procedure Laterality Date  . ABDOMINAL HYSTERECTOMY    . BREAST BIOPSY Right 09/20/2012   Procedure: BREAST BIOPSY WITH NEEDLE LOCALIZATION;  Surgeon: Rolm Bookbinder, MD;  Location: [facility];  Service: General;  Laterality: Right;  . CESAREAN SECTION    . COLONOSCOPY WITH PROPOFOL N/A 07/13/2014   Procedure: COLONOSCOPY WITH PROPOFOL;  Surgeon: Beryle Beams, MD;  Location: [facility];  Service: Endoscopy;  Laterality: N/A;  . DEBRIDEMENT SKIN / SQ / MUSCLE OF ARM Right 07/13/2003   I & D; debridement of tissue within triceps muscle  . LEG SURGERY     sx as a child  . MELANOMA EXCISION    . PARTIAL HYSTERECTOMY    . TONSILLECTOMY     as a child    FAMILY HISTORY: Family History  Problem Relation Age of Onset  . Hyperlipidemia Mother   . Hypertension Mother   . Hypertension Father   . Diabetes type II Father     SOCIAL HISTORY:  Social History   Social History  . Marital status: Married    Spouse name: N/A  . Number of children: N/A  . Years of education: N/A   Occupational History  . Not on file.   Social History Main Topics  . Smoking status: Never Smoker  . Smokeless tobacco: Never Used  . Alcohol use Yes     Comment: social  . Drug use: No  . Sexual activity: Not on file   Other Topics Concern  . Not on file   Social History Narrative  . No narrative on file  PHYSICAL EXAM  Vitals:   11/19/16 1310  BP: 122/79  Pulse: 81  Resp: 16  Weight: 136 lb (61.7 kg)  Height: 5\' 5"  (1.651 m)    Body mass index is 22.63 kg/m.   General: The patient is well-developed and well-nourished and in no acute distress  HEENT:   [place]/AT,   Oropharynx was  normal.  Skin/Ext/Musculoskeletal: Mild pedal edema.   She is very tender over the piriformis muscle on the right. She is moderately tender over the trochanteric bursa on the right. The left hip region is nontender.     Neurologic Exam  Mental status: The patient is alert and oriented x 3 at the time of the examination. The patient has apparent normal recent and remote memory, with an apparently normal attention span and concentration ability.   Speech is normal.  Cranial nerves: Extraocular movements are full. There is good facial sensation to soft touch bilaterally.Facial strength is normal.  Trapezius and sternocleidomastoid strength is normal. No dysarthria is noted.  No obvious hearing deficits are noted.  Motor:  Muscle bulk and tone are normal. Strength is  4- / 5 in proximal legs and 4/5 distally, worse on left. Left and right arm are similar.    She uses arms to get out of chair  Sensory: Sensory testing is intact to soft touch, vibration sensation in arms but mild decreased touch in left leg.   .  Coordination: Cerebellar testing reveals reduced finger-nose-finger and poor heel-to-shin bilaterally.  Gait and station: She needs to use arms to stand.    Station is unsteady but she can stand a short time without support.   She can take a couple steps with just unilateral support which is improved..  She can not tandem.   She transfers independently  Reflexes: Deep tendon reflexes are symmetric and increased in legs bilaterally with spread at the knees.Nonsustained ankle clonus bilaterally.     DIAGNOSTIC DATA (LABS, IMAGING, TESTING) - I reviewed patient records, labs, notes, testing and imaging myself where available.  Lab Results  Component Value Date   WBC 7.4 10/01/2015   HGB 12.5 08/17/2014   HCT 38.4 10/01/2015   MCV 93 10/01/2015   PLT 220 10/01/2015      Component Value Date/Time   NA 137 08/18/2014 0657   K 4.0 08/18/2014 0657   CL 105 08/18/2014 0657   CO2 28  08/18/2014 0657   GLUCOSE 91 08/18/2014 0657   BUN 7 08/18/2014 0657   CREATININE 0.43 (L) 08/18/2014 0657   CALCIUM 8.1 (L) 08/18/2014 0657   PROT 6.6 10/01/2015 1351   ALBUMIN 4.5 10/01/2015 1351   AST 11 10/01/2015 1351   ALT 11 10/01/2015 1351   ALKPHOS 62 10/01/2015 1351   BILITOT 0.4 10/01/2015 1351   GFRNONAA >90 08/18/2014 0657   GFRAA >90 08/18/2014 0657      ASSESSMENT AND PLAN  Multiple sclerosis ([facility])  Dysesthesia  Benign paroxysmal positional vertigo of left ear  Right sided sciatica  Trochanteric bursitis of right hip   1.   Continue ocrelizumab.  Next infusion is August 2..   For the dysesthesias continue lamotrigine and gabapentin  3.   TPI right piriformis with 40 mg Depomedrol in [place] using sterile technique.    She tolerated the injection well.   4.    Inject right trochanteric bursa with 40 mg Depo-Medrol in mar[place]aine using sterile technique.    She tolerated the injection well.   4.  She will return  to see me in 4 months or sooner if she has new or worsening neurologic symptoms.  Hayden Kihara A. Felecia Shelling, MD, PhD 07/31/3341, 5:68 PM Certified in Neurology, Clinical Neurophysiology, Sleep Medicine, Pain Medicine and Neuroimaging  [facility] [address] [place],  [address] [phone] /

## 2016-12-02 ENCOUNTER — Telehealth: Payer: Self-pay | Admitting: Neurology

## 2016-12-02 NOTE — Telephone Encounter (Signed)
Patient called office to see if Dr. Felecia Shelling will let her have a steroid IV due to patient not getting around good and no energy.  Please call

## 2016-12-02 NOTE — Telephone Encounter (Signed)
LMOM (identified vm) that per RAS, ok for SoluMedrol 1gm IV.  Otila Kluver can do this for her tomorrow (Thursday, 12/03/16) at noon.  She should call back if she needs to change this appt/fim

## 2016-12-03 DIAGNOSIS — L820 Inflamed seborrheic keratosis: Secondary | ICD-10-CM | POA: Diagnosis not present

## 2016-12-03 DIAGNOSIS — Z85828 Personal history of other malignant neoplasm of skin: Secondary | ICD-10-CM | POA: Diagnosis not present

## 2016-12-03 DIAGNOSIS — G35 Multiple sclerosis: Secondary | ICD-10-CM | POA: Diagnosis not present

## 2016-12-03 DIAGNOSIS — L718 Other rosacea: Secondary | ICD-10-CM | POA: Diagnosis not present

## 2016-12-11 ENCOUNTER — Telehealth: Payer: Self-pay | Admitting: Neurology

## 2016-12-11 NOTE — Telephone Encounter (Signed)
Patient called office requesting refill for tapentadol (NUCYNTA) 50 MG tablet.

## 2016-12-14 MED ORDER — TAPENTADOL HCL 50 MG PO TABS: 50.0000 mg | ORAL_TABLET | Freq: Three times a day (TID) | ORAL | 0 refills | Status: DC | PRN

## 2016-12-14 NOTE — Addendum Note (Signed)
Addended by: France Ravens I on: 12/14/2016 09:12 AM   Modules accepted: Orders

## 2016-12-14 NOTE — Telephone Encounter (Signed)
Rx. awaiting RAS sig/fim 

## 2016-12-14 NOTE — Telephone Encounter (Signed)
Rx. up front [facility]/fim 

## 2016-12-15 ENCOUNTER — Encounter: Payer: Self-pay | Admitting: *Deleted

## 2016-12-31 DIAGNOSIS — R8290 Unspecified abnormal findings in urine: Secondary | ICD-10-CM | POA: Diagnosis not present

## 2016-12-31 DIAGNOSIS — H00019 Hordeolum externum unspecified eye, unspecified eyelid: Secondary | ICD-10-CM | POA: Diagnosis not present

## 2017-01-13 ENCOUNTER — Telehealth: Payer: Self-pay | Admitting: Neurology

## 2017-01-13 NOTE — Telephone Encounter (Signed)
Per RAS, ok for SM 1gm IV once.  Orders given to Tina/fim

## 2017-01-13 NOTE — Telephone Encounter (Signed)
Patient called office in reference to scheduling Solumedrol 1mg  IV.  Please call

## 2017-01-14 ENCOUNTER — Encounter: Payer: Self-pay | Admitting: *Deleted

## 2017-01-14 DIAGNOSIS — G35 Multiple sclerosis: Secondary | ICD-10-CM | POA: Diagnosis not present

## 2017-01-21 ENCOUNTER — Telehealth: Payer: Self-pay | Admitting: Neurology

## 2017-01-21 DIAGNOSIS — G35 Multiple sclerosis: Secondary | ICD-10-CM

## 2017-01-21 DIAGNOSIS — R42 Dizziness and giddiness: Secondary | ICD-10-CM

## 2017-01-21 DIAGNOSIS — R269 Unspecified abnormalities of gait and mobility: Secondary | ICD-10-CM

## 2017-01-21 NOTE — Telephone Encounter (Signed)
Pt sister([name] on DPR) calling re: pt's vertigo getting very bad and that pt has not been out of bed for 3 days.  Pt sister want to have arrangements made through the M/S support society for a nurse because things are to a point that pt sister isn't able to do much more for pt.  Please call

## 2017-01-22 MED ORDER — MECLIZINE HCL 25 MG PO TABS: 25.0000 mg | ORAL_TABLET | Freq: Three times a day (TID) | ORAL | 0 refills | Status: DC | PRN

## 2017-01-22 NOTE — Telephone Encounter (Signed)
I have spoken with [name] this morning.  She sts. Tonya Powers is having another episode of vertigo.  Having difficulty getting out of bed,  N/V.  Has Phenergan at home prn. Meclizine called to [facility]. They would like to try and get a home health aide to help--Referral sent to [facility] for Nsg. (skilled assessment), PT (for safe ambulation), and HHA (daily ADL's).  I have given [name] the # for the [facility] chapter of the [place] [phone]), and the home health aide division of [facility] [phone]).  I have called [facility] to let them know referrals are being sent in and pt's family will call to discuss details of what services they need from the [facility].  I have explained to [name] that while [name] has n/v assoc. with vertigo, focus should be on nutrition/hydration,  Gatorade or Pedialyte for electrolyte replacement, and eval in the ER if she has continued n/v, confusion, increased weakness, difficulty breathing, poor skin turgor/pallor, lethargy.  [name] verbalized understanding of same/fim

## 2017-01-22 NOTE — Addendum Note (Signed)
Addended by: France Ravens I on: 01/22/2017 10:06 AM   Modules accepted: Orders

## 2017-01-23 ENCOUNTER — Encounter (HOSPITAL_COMMUNITY): Payer: Self-pay | Admitting: *Deleted

## 2017-01-23 ENCOUNTER — Emergency Department (HOSPITAL_COMMUNITY): Payer: Medicare Other

## 2017-01-23 ENCOUNTER — Emergency Department (HOSPITAL_COMMUNITY)
Admission: EM | Admit: 2017-01-23 | Discharge: 2017-01-23 | Disposition: A | Discharge status: Home / Self Care | Payer: Medicare Other | Attending: Emergency Medicine | Admitting: Emergency Medicine

## 2017-01-23 DIAGNOSIS — Z79899 Other long term (current) drug therapy: Secondary | ICD-10-CM | POA: Diagnosis not present

## 2017-01-23 DIAGNOSIS — Z85828 Personal history of other malignant neoplasm of skin: Secondary | ICD-10-CM | POA: Diagnosis not present

## 2017-01-23 DIAGNOSIS — R404 Transient alteration of awareness: Secondary | ICD-10-CM | POA: Diagnosis not present

## 2017-01-23 DIAGNOSIS — R42 Dizziness and giddiness: Secondary | ICD-10-CM | POA: Diagnosis not present

## 2017-01-23 LAB — BASIC METABOLIC PANEL
ANION GAP: 11 (ref 5–15)
BUN: 15 mg/dL (ref 6–20)
CHLORIDE: 99 mmol/L — AB (ref 101–111)
CO2: 29 mmol/L (ref 22–32)
Calcium: 9.5 mg/dL (ref 8.9–10.3)
Creatinine, Ser: 0.58 mg/dL (ref 0.44–1.00)
Glucose, Bld: 119 mg/dL — ABNORMAL HIGH (ref 65–99)
POTASSIUM: 3.4 mmol/L — AB (ref 3.5–5.1)
SODIUM: 139 mmol/L (ref 135–145)

## 2017-01-23 LAB — CBC
HEMATOCRIT: 40.1 % (ref 36.0–46.0)
Hemoglobin: 13.3 g/dL (ref 12.0–15.0)
MCH: 31.2 pg (ref 26.0–34.0)
MCHC: 33.2 g/dL (ref 30.0–36.0)
MCV: 94.1 fL (ref 78.0–100.0)
Platelets: 252 10*3/uL (ref 150–400)
RBC: 4.26 MIL/uL (ref 3.87–5.11)
RDW: 13 % (ref 11.5–15.5)
WBC: 12.9 10*3/uL — AB (ref 4.0–10.5)

## 2017-01-23 LAB — URINALYSIS, ROUTINE W REFLEX MICROSCOPIC
BILIRUBIN URINE: NEGATIVE
Glucose, UA: NEGATIVE mg/dL
KETONES UR: 20 mg/dL — AB
LEUKOCYTES UA: NEGATIVE
NITRITE: NEGATIVE
PH: 5 (ref 5.0–8.0)
Protein, ur: NEGATIVE mg/dL
SPECIFIC GRAVITY, URINE: 1.02 (ref 1.005–1.030)

## 2017-01-23 LAB — CBG MONITORING, ED: GLUCOSE-CAPILLARY: 98 mg/dL (ref 65–99)

## 2017-01-23 MED ORDER — MORPHINE SULFATE (PF) 2 MG/ML IV SOLN
4.0000 mg | Freq: Once | INTRAVENOUS | Status: AC
  Administered 2017-01-23: 4 mg via INTRAVENOUS
  Filled 2017-01-23: qty 2

## 2017-01-23 MED ORDER — MECLIZINE HCL 25 MG PO TABS
25.0000 mg | ORAL_TABLET | Freq: Once | ORAL | Status: AC
  Administered 2017-01-23: 25 mg via ORAL
  Filled 2017-01-23: qty 1

## 2017-01-23 MED ORDER — ONDANSETRON HCL 4 MG/2ML IJ SOLN
4.0000 mg | Freq: Once | INTRAMUSCULAR | Status: AC
  Administered 2017-01-23: 4 mg via INTRAVENOUS
  Filled 2017-01-23: qty 2

## 2017-01-23 MED ORDER — SODIUM CHLORIDE 0.9 % IV BOLUS (SEPSIS)
1000.0000 mL | Freq: Once | INTRAVENOUS | Status: AC
  Administered 2017-01-23: 1000 mL via INTRAVENOUS

## 2017-01-23 NOTE — ED Notes (Signed)
Bed: JK09 Expected date: 01/23/17 Expected time: 2:24 PM Means of arrival: Ambulance Comments: rm 25 vertigo

## 2017-01-23 NOTE — ED Notes (Signed)
While nurse was administering the 2mg  of Morphine of 4 total the syringe top came off and morphine was wasted on the floor.  Dr. Tamera Punt and charge nurse [place] notified.  Dr. Tamera Punt to place additional order.

## 2017-01-23 NOTE — ED Triage Notes (Signed)
EMS reports pt has had vertigo symptoms with nausea since Monday, Worse with movement. Pt has increased pain due to her MS.

## 2017-01-23 NOTE — ED Notes (Addendum)
MRI came to take patient but patient refused to have test.  Dr. Tamera Punt notified.

## 2017-01-23 NOTE — ED Notes (Signed)
Patient on bedpan trying to give urine sample.

## 2017-01-23 NOTE — ED Provider Notes (Signed)
[facility] DEPT Provider Note   CSN: [id number] Arrival date & time: 01/23/17  1425     History   Chief Complaint Chief Complaint  Patient presents with  . Dizziness  . Nausea    HPI Tonya Powers is a 56 y.o. female.  Patient is a 56 year old female with a history of MS who presents with vertigo symptoms as well as a headache. She states for the last 5 days she's had worsening vertigo with the sensation that the room spinning. She's had double vision as well. She also started having a headache about 4 days ago. This is been gradually worsening. She describes pain to the posterior aspect of her head that goes down her neck as well. She's had vertigo when time in the past about a year ago she states that states that she did not have a headache with it. She has occasional frontal headaches but states this headache is different than her typical headaches. She's been using ibuprofen with no improvement in symptoms. She has generalized weakness which is worse than her baseline weakness. She denies any unilateral weakness. No known fevers. She's had some associated nausea and vomiting with vertigo. She states her symptoms are worse when she opens her eyes or moves her head from side to side. At baseline, she ambulates with a walker and primarily uses a scooter. She's been in bed for the last 3-4 days.      Past Medical History:  Diagnosis Date  . Anxiety   . Depression   . Gait instability    uses wheelchair or assistance  . H/O steroid therapy    IV infusion every 6 to 8 weeks- last 05-31-14 [facility]  . History of breast biopsy   . Multiple sclerosis ([place])   . Neuromuscular disorder ([place])    MS  . PONV (postoperative nausea and vomiting)   . Skin cancer     Patient Active Problem List   Diagnosis Date Noted  . Right sided sciatica 11/19/2016  . Trochanteric bursitis of right hip 11/19/2016  . Benign paroxysmal positional vertigo 03/24/2016  . Pedal edema  01/30/2016  . Dysesthesia 01/30/2016  . Gastroenteritis 08/16/2014  . Nausea vomiting and diarrhea 08/16/2014  . Hypokalemia 08/16/2014  . Multiple sclerosis ([place]) 07/24/2014  . Ataxic gait 07/24/2014  . Chronic fatigue 07/24/2014  . Urinary dysfunction 07/24/2014  . Chronic insomnia 07/24/2014  . Depression with anxiety 07/24/2014    Past Surgical History:  Procedure Laterality Date  . ABDOMINAL HYSTERECTOMY    . BREAST BIOPSY Right 09/20/2012   Procedure: BREAST BIOPSY WITH NEEDLE LOCALIZATION;  Surgeon: Rolm Bookbinder, MD;  Location: [facility];  Service: General;  Laterality: Right;  . CESAREAN SECTION    . COLONOSCOPY WITH PROPOFOL N/A 07/13/2014   Procedure: COLONOSCOPY WITH PROPOFOL;  Surgeon: Beryle Beams, MD;  Location: [facility];  Service: Endoscopy;  Laterality: N/A;  . DEBRIDEMENT SKIN / SQ / MUSCLE OF ARM Right 07/13/2003   I & D; debridement of tissue within triceps muscle  . LEG SURGERY     sx as a child  . MELANOMA EXCISION    . PARTIAL HYSTERECTOMY    . TONSILLECTOMY     as a child    OB History    No data available       Home Medications    Prior to Admission medications   Medication Sig Start Date End Date Taking? Authorizing Provider  ALPRAZolam Duanne Moron) 0.5 MG tablet Take 0.5  mg by mouth daily as needed for anxiety.  06/24/12  Yes [provider]  amphetamine-dextroamphetamine (ADDERALL) 20 MG tablet Take 1 tablet (20 mg total) by mouth 2 (two) times daily. 11/19/16  Yes Sater, Nanine Means, MD  cholecalciferol (VITAMIN D) 1000 units tablet Take 1,000 Units by mouth daily.   Yes [provider]  clonazePAM (KLONOPIN) 1 MG tablet TAKE 1 TABLET BY MOUTH EVERY NIGHT AT BEDTIME AS NEEDED FOR ANXIETY Patient taking differently: Take 1 mg by mouth at bedtime as needed for anxiety. TAKE 1 TABLET BY MOUTH EVERY NIGHT AT BEDTIME AS NEEDED FOR ANXIETY 10/20/16  Yes Dennie Bible, NP  desmopressin (DDAVP) 0.1 MG tablet  Take 1 tablet (0.1 mg total) by mouth at bedtime as needed (bladder.). 01/30/16  Yes Sater, Nanine Means, MD  diazepam (VALIUM) 5 MG tablet Take 1 tablet (5 mg total) by mouth every 8 (eight) hours as needed. for anxiety Patient taking differently: Take 5 mg by mouth every 8 (eight) hours as needed for anxiety.  07/21/16  Yes Sater, Nanine Means, MD  doxepin (SINEQUAN) 10 MG capsule TAKE 1 CAPSULE(10 MG) BY MOUTH AT BEDTIME AS NEEDED Patient taking differently: TAKE 1 CAPSULE(10 MG) BY MOUTH AT BEDTIME AS NEEDED FOR SLEEP 12/06/15  Yes Sater, Nanine Means, MD  estradiol (CLIMARA - DOSED IN MG/24 HR) 0.075 mg/24hr patch Place 0.075 mg onto the skin once a week. Put on Saturdays. 07/02/14  Yes [provider]  gabapentin (NEURONTIN) 800 MG tablet Take 1 tablet (800 mg total) by mouth 4 (four) times daily. 10/17/15  Yes Sater, Nanine Means, MD  ibuprofen (ADVIL,MOTRIN) 200 MG tablet Take 400 mg by mouth every 6 (six) hours as needed for headache (headache).   Yes [provider]  lamoTRIgine (LAMICTAL) 200 MG tablet Take 200 mg by mouth 2 (two) times daily.  10/26/16  Yes [provider]  meclizine (ANTIVERT) 25 MG tablet Take 1 tablet (25 mg total) by mouth 3 (three) times daily as needed for dizziness. 01/22/17  Yes Sater, Nanine Means, MD  ocrelizumab 600 mg in sodium chloride 0.9 % 500 mL Inject 600 mg into the vein every 6 (six) months.   Yes [provider]  promethazine (PHENERGAN) 25 MG tablet Take 1 tablet (25 mg total) by mouth every 6 (six) hours as needed for nausea (nausea). 02/27/16  Yes Sater, Nanine Means, MD  tapentadol (NUCYNTA) 50 MG tablet Take 1 tablet (50 mg total) by mouth 3 (three) times daily as needed for moderate pain or severe pain (pain). 12/14/16  Yes Sater, Nanine Means, MD  traMADol (ULTRAM) 50 MG tablet TAKE 1 TABLET BY MOUTH THREE TIMES DAILY AS NEEDED FOR MODERATE PAIN Patient taking differently: Take 50 mg by mouth 3 (three) times daily as needed for moderate pain or  severe pain. TAKE 1 TABLET BY MOUTH THREE TIMES DAILY AS NEEDED FOR MODERATE PAIN 11/19/16  Yes Sater, Nanine Means, MD  zolpidem (AMBIEN) 10 MG tablet Take 1 tablet (10 mg total) by mouth at bedtime as needed for sleep (sleep). 08/18/16  Yes Sater, Nanine Means, MD  carbamazepine (TEGRETOL XR) 200 MG 12 hr tablet Take 1 tablet (200 mg total) by mouth 2 (two) times daily. Patient not taking: Reported on 11/19/2016 10/20/16   Dennie Bible, NP    Family History Family History  Problem Relation Age of Onset  . Hyperlipidemia Mother   . Hypertension Mother   . Hypertension Father   . Diabetes type II  Father     Social History Social History  Substance Use Topics  . Smoking status: Never Smoker  . Smokeless tobacco: Never Used  . Alcohol use Yes     Comment: social     Allergies   Codeine   Review of Systems Review of Systems  Constitutional: Positive for fatigue. Negative for chills, diaphoresis and fever.  HENT: Negative for congestion, rhinorrhea and sneezing.   Eyes: Negative.   Respiratory: Negative for cough, chest tightness and shortness of breath.   Cardiovascular: Negative for chest pain and leg swelling.  Gastrointestinal: Positive for nausea and vomiting. Negative for abdominal pain, blood in stool and diarrhea.  Genitourinary: Negative for difficulty urinating, flank pain, frequency and hematuria.  Musculoskeletal: Negative for arthralgias and back pain.  Skin: Negative for rash.  Neurological: Positive for dizziness, weakness and headaches. Negative for speech difficulty and numbness.     Physical Exam Updated Vital Signs BP 136/82   Pulse 78   Temp (!) 97.5 F (36.4 C) (Oral)   Resp 15   Ht 5\' 5"  (1.651 m)   Wt 61.2 kg (135 lb)   SpO2 98%   BMI 22.47 kg/m   Physical Exam  Constitutional: She is oriented to person, place, and time. She appears well-developed and well-nourished.  HENT:  Head: Normocephalic and atraumatic.  Eyes: Pupils are equal,  round, and reactive to light. EOM are normal.  No obvious nystagmus noted although patient has difficulty keeping her eyes open for exam  Neck: Normal range of motion. Neck supple.  No meningismus  Cardiovascular: Normal rate, regular rhythm and normal heart sounds.   Pulmonary/Chest: Effort normal and breath sounds normal. No respiratory distress. She has no wheezes. She has no rales. She exhibits no tenderness.  Abdominal: Soft. Bowel sounds are normal. There is no tenderness. There is no rebound and no guarding.  Musculoskeletal: Normal range of motion. She exhibits no edema.  Lymphadenopathy:    She has no cervical adenopathy.  Neurological: She is alert and oriented to person, place, and time.  She has generalized weakness in all extremities. She is able to raise her right leg off the bed about 2 inches but is not able to raise her left leg off the bed. She states normally her left leg is weaker than the right leg. She has normal sensation to light touch in all extremities although she states it's a little bit diminished in the left leg. This is documented as well and a recent neurology visit. Finger to nose is slowed bilaterally. No facial drooping, no slurred speech  Skin: Skin is warm and dry. No rash noted.  Psychiatric: She has a normal mood and affect.     ED Treatments / Results  Labs (all labs ordered are listed, but only abnormal results are displayed) Labs Reviewed  BASIC METABOLIC PANEL - Abnormal; Notable for the following:       Result Value   Potassium 3.4 (*)    Chloride 99 (*)    Glucose, Bld 119 (*)    All other components within normal limits  CBC - Abnormal; Notable for the following:    WBC 12.9 (*)    All other components within normal limits  URINALYSIS, ROUTINE W REFLEX MICROSCOPIC - Abnormal; Notable for the following:    Hgb urine dipstick SMALL (*)    Ketones, ur 20 (*)    Bacteria, UA RARE (*)    Squamous Epithelial / LPF 0-5 (*)    All other  components within normal limits  CBG MONITORING, ED    EKG  EKG Interpretation  Date/Time:  Saturday January 23 2017 14:40:43 EDT Ventricular Rate:  69 PR Interval:    QRS Duration: 102 QT Interval:  438 QTC Calculation: 470 R Axis:   91 Text Interpretation:  Sinus rhythm Right atrial enlargement Probable inferior infarct, old Probable anterolateral infarct, old since last tracing no significant change Confirmed by Malvin Johns [id number]) on 01/23/2017 3:41:28 PM       Radiology No results found.  Procedures Procedures (including critical care time)  Medications Ordered in ED Medications  sodium chloride 0.9 % bolus 1,000 mL (0 mLs Intravenous Stopped 01/23/17 1648)  ondansetron (ZOFRAN) injection 4 mg (4 mg Intravenous Given 01/23/17 1557)  meclizine (ANTIVERT) tablet 25 mg (25 mg Oral Given 01/23/17 1556)  morphine 2 MG/ML injection 4 mg (4 mg Intravenous Given 01/23/17 1602)  morphine 2 MG/ML injection 4 mg (4 mg Intravenous Given 01/23/17 1745)  sodium chloride 0.9 % bolus 1,000 mL (1,000 mLs Intravenous New Bag/Given 01/23/17 1747)     Initial Impression / Assessment and Plan / ED Course  I have reviewed the triage vital signs and the nursing notes.  Pertinent labs & imaging results that were available during my care of the patient were reviewed by me and considered in my medical decision making (see chart for details).  Clinical Course as of Jan 23 1857  Sat Jan 23, 2017  1638 Pt refusing MR. Wants to see if she feels better after treatment.    [MB]    Clinical Course User Index [MB] Malvin Johns, MD    Patient presents with vertigo. She had associated vomiting. She also had associated headache and neck pain. She felt like her right leg was weaker than the left. Given these symptoms in association with the vertigo, I did order an MRI. However patient decided that she did not want to have this. She feels like her symptoms are similar to her prior episodes of vertigo. She  was given IV fluids along with symptomatic relief for her vertigo including meclizine and Zofran. She's feeling much better after this. Her headache is resolved. Her dizziness has much improved. She is able to open her eyes and look around the room. She's moving her head with no dizziness. She's able to tolerate oral fluids. We did not ambulate her as she typically uses an Transport planner for mobilization. She was discharged home in good condition. She will follow-up with her neurologist. Return precautions were given.  Final Clinical Impressions(s) / ED Diagnoses   Final diagnoses:  Vertigo    New Prescriptions New Prescriptions   No medications on file     Malvin Johns, MD 01/23/17 1900

## 2017-01-25 NOTE — Telephone Encounter (Signed)
Pt sister is calling asking to speak with RN Faith re: pt still having vertigo, please call

## 2017-01-26 ENCOUNTER — Telehealth: Payer: Self-pay | Admitting: Neurology

## 2017-01-26 DIAGNOSIS — F419 Anxiety disorder, unspecified: Secondary | ICD-10-CM | POA: Diagnosis not present

## 2017-01-26 DIAGNOSIS — M5431 Sciatica, right side: Secondary | ICD-10-CM | POA: Diagnosis not present

## 2017-01-26 DIAGNOSIS — G35 Multiple sclerosis: Secondary | ICD-10-CM | POA: Diagnosis not present

## 2017-01-26 DIAGNOSIS — Z9181 History of falling: Secondary | ICD-10-CM | POA: Diagnosis not present

## 2017-01-26 DIAGNOSIS — F329 Major depressive disorder, single episode, unspecified: Secondary | ICD-10-CM | POA: Diagnosis not present

## 2017-01-26 DIAGNOSIS — N399 Disorder of urinary system, unspecified: Secondary | ICD-10-CM | POA: Diagnosis not present

## 2017-01-26 MED ORDER — METHYLPREDNISOLONE 4 MG PO TBPK: ORAL_TABLET | ORAL | 0 refills | Status: DC

## 2017-01-26 NOTE — Telephone Encounter (Signed)
LMOM (identified vm) for Georgana to call.  I have also lmom for [name] to call/fim

## 2017-01-26 NOTE — Telephone Encounter (Signed)
Pt has called back asking to speak with RN Faith, please call back

## 2017-01-26 NOTE — Addendum Note (Signed)
Addended by: France Ravens I on: 01/26/2017 12:10 PM   Modules accepted: Orders

## 2017-01-26 NOTE — Telephone Encounter (Signed)
I have spoken with Tonya Powers.  She sts. intermittent vertigo continues. Minimal relief with Meclizine.  Sts. sister-in-law was trained to do the Epley maneuver and has done this several times, with no relief.  She does not think she can come in to see RAS.  Per RAS, ok for Medrol dose pk., and pt. has benzodiazepine at home that should help as well.  Pt. agreeable.  Rx. for medrol dose pk. escribed to [facility]. Pt. sts. [name] is coming out for soc this afternoon.  Referral was supposed to go to [facility].  I will check with [facility] and cancel them if referral was sent to them.  I will ask Hinton Dyer to make sure referral goes to [facility]

## 2017-01-26 NOTE — Telephone Encounter (Signed)
Adadia with [facility] needs Hinton Dyer to call her regarding referral. [phone]

## 2017-01-27 NOTE — Telephone Encounter (Signed)
I have spoke to patient she relayed she is going to stay with [facility] for [facility]. She will call me back if she want's to switch.

## 2017-01-28 ENCOUNTER — Telehealth: Payer: Self-pay | Admitting: Neurology

## 2017-01-28 ENCOUNTER — Ambulatory Visit (INDEPENDENT_AMBULATORY_CARE_PROVIDER_SITE_OTHER): Payer: Medicare Other | Admitting: *Deleted

## 2017-01-28 DIAGNOSIS — R42 Dizziness and giddiness: Secondary | ICD-10-CM | POA: Diagnosis not present

## 2017-01-28 MED ORDER — DEXAMETHASONE SODIUM PHOSPHATE 4 MG/ML IJ SOLN
8.0000 mg | Freq: Once | INTRAMUSCULAR | Status: AC
  Administered 2017-01-28: 8 mg via INTRAMUSCULAR

## 2017-01-28 NOTE — Telephone Encounter (Signed)
I have spoken with Tonya Powers.  Pt. requests Decadron IM for vertigo.  Per RAS, ok for 8mg .  Added to schedule/fim

## 2017-01-28 NOTE — Telephone Encounter (Signed)
Pt sister Sherri(on DPR) calling to inform that the last medication called in is not helping at all.  She would like to discuss with RN Faith, please call.

## 2017-02-01 ENCOUNTER — Other Ambulatory Visit: Payer: Self-pay | Admitting: Neurology

## 2017-02-01 ENCOUNTER — Telehealth: Payer: Self-pay | Admitting: Neurology

## 2017-02-01 MED ORDER — AMPHETAMINE-DEXTROAMPHETAMINE 20 MG PO TABS: 20.0000 mg | ORAL_TABLET | Freq: Two times a day (BID) | ORAL | 0 refills | Status: DC

## 2017-02-01 MED ORDER — GABAPENTIN 800 MG PO TABS: 800.0000 mg | ORAL_TABLET | Freq: Four times a day (QID) | ORAL | 11 refills | Status: DC

## 2017-02-01 NOTE — Telephone Encounter (Signed)
Pt calling for a refill of gabapentin (NEURONTIN) 800 MG tablet  [facility] [place], [address] [phone] (Phone) [phone] (Fax)   And  amphetamine-dextroamphetamine (ADDERALL) 20 MG tablet

## 2017-02-01 NOTE — Addendum Note (Signed)
Addended by: Brandon Melnick on: 02/01/2017 05:08 PM   Modules accepted: Orders

## 2017-02-01 NOTE — Telephone Encounter (Signed)
Called and spoke with pt and was able to fit her in for an apt tomorrow at 10:30am. Pt verbalized understanding of arriving 30 min earlier.

## 2017-02-01 NOTE — Telephone Encounter (Signed)
Patient called and requested to speak with Faith RN. When I told her that Kyra Searles was out of the office she was very hesitant to speak with anyone else but agreed to let me send a message back. She would like to speak with a nurse regarding her vertigo. Please call and advise.

## 2017-02-01 NOTE — Telephone Encounter (Signed)
Nicolette with [facility] is calling to get a verbal order for home health skilled nursing for 1 time a week for 4 weeks and 1 as needed visit,. Also asking for OT, PT and social worker consult.

## 2017-02-01 NOTE — Telephone Encounter (Signed)
Called Tonya Powers to discuss her concern. Tonya Powers stated that she is still having trouble with her vertigo. She recently received phenergan and steroid injection but still remains uncomfortable. She is calling to see if there is any other suggestions Dr Felecia Shelling may have to help her.

## 2017-02-01 NOTE — Telephone Encounter (Signed)
Please see if I have any openings later this week to work her in.

## 2017-02-02 ENCOUNTER — Encounter: Payer: Self-pay | Admitting: Neurology

## 2017-02-02 ENCOUNTER — Ambulatory Visit (INDEPENDENT_AMBULATORY_CARE_PROVIDER_SITE_OTHER): Payer: Medicare Other | Admitting: Neurology

## 2017-02-02 VITALS — BP 129/82 | HR 88 | Ht 65.0 in | Wt 136.0 lb

## 2017-02-02 DIAGNOSIS — R29898 Other symptoms and signs involving the musculoskeletal system: Secondary | ICD-10-CM | POA: Insufficient documentation

## 2017-02-02 DIAGNOSIS — R260 Ataxic gait: Secondary | ICD-10-CM

## 2017-02-02 DIAGNOSIS — R208 Other disturbances of skin sensation: Secondary | ICD-10-CM

## 2017-02-02 DIAGNOSIS — R42 Dizziness and giddiness: Secondary | ICD-10-CM

## 2017-02-02 DIAGNOSIS — G35 Multiple sclerosis: Secondary | ICD-10-CM | POA: Diagnosis not present

## 2017-02-02 DIAGNOSIS — R39198 Other difficulties with micturition: Secondary | ICD-10-CM | POA: Diagnosis not present

## 2017-02-02 DIAGNOSIS — F418 Other specified anxiety disorders: Secondary | ICD-10-CM

## 2017-02-02 MED ORDER — GABAPENTIN 800 MG PO TABS: 800.0000 mg | ORAL_TABLET | Freq: Four times a day (QID) | ORAL | 11 refills | Status: DC

## 2017-02-02 MED ORDER — AMPHETAMINE-DEXTROAMPHETAMINE 20 MG PO TABS: 20.0000 mg | ORAL_TABLET | Freq: Two times a day (BID) | ORAL | 0 refills | Status: DC

## 2017-02-02 NOTE — Telephone Encounter (Signed)
Nicolette has called back from [facility] asking for a call back, please call.

## 2017-02-02 NOTE — Telephone Encounter (Signed)
I called Nicolette again, no answer, left another message asking her to call me back.

## 2017-02-02 NOTE — Telephone Encounter (Signed)
Placed printed/signed rx adderall up front for patient pick up.  

## 2017-02-02 NOTE — Telephone Encounter (Signed)
I called Nicolette at [facility] back to discuss orders. No answer, left a message asking her to call me back.

## 2017-02-02 NOTE — Progress Notes (Signed)
[facility]  PATIENT: Tonya Powers DOB: [birth year]  REFERRING CLINICIAN: Mayra Neer is PCP HISTORY FROM: Patient  REASON FOR VISIT: MS and poor gait   HISTORICAL  CHIEF COMPLAINT:  Chief Complaint  Patient presents with  . Rm 12  . Follow-up    Sister, [place]  . Dizziness    Pt returns w/ c/o ongoing vertigo despite IV solumedrol, steroid dosepak, meclizine and IM Decadron last week.    HISTORY OF PRESENT ILLNESS:  Tonya Powers is a 56 year old woman with MS.    Vertical has been much worse lately. Additionally, headaches are worse.  Vertigo:    She has had vertigo x 15 days.   She has been on meclizine, phenergan, Medrol dosepak, clonazepam (not sure if helped but sleeps better)and IV Solu-Medrol.  Dramamine may have helped slightly.   She has vertigo while still and has diplopia.    Initially she also had N/V.  The diplopia is worse with movements.     She went to the ED and had a Morphine shot when pain was worse and also had IV fluids.     MS:   She is back on the Ocrevus.   She thinks her MS is probably stable but is concerned that the vertigo might be a new exacerbation.Marland Kitchen    She was on Tysabri and was stable but converted to JCV Ab positive and stopped.   She could not tolerate Tecfidera.        Gait/strength :    Her gait is very poor and she mostly uses a scooter. She can use a walker for a few steps (probably up to 20 feet).     She has left > right leg weakness and spasticity.       Dysesthesia/hip pain:   She continues to report burning pain in the right leg and right flank lamotrigine has only helps the pain a little bit.    Tegretol and gabapentin have not helped much.   TCA's have not helped.     Baclofen made her feel weaker and she stopped.     Nucynta is too expensive.   Tramadol has not helped much   Bladder/bowel:  She has a difficult bladder dysfunction with frequent incontinence. She has quite a bit of hesitancy, worse when she uses the  opiates (now off of these).    Tamsulosin did not really help the hesitancy. Desmopressin helps her to the bed at night to help her more. She still has frequent incontinence.     She reports constipation. Linzess did not help.    Nucynta does not worsen constipation like opiates did but is very expensive.  Fatigue/sleep:   She has fatigue most days, worse with heat. Adderall has helped.   She does well doing pool exercises.,  When fatiue is more severe she takes  Solu-Medrol infusions.   She has both sleep onset and sleep maintenance insomnia.   She takes clonazepam 1 mg and Ambien every night due to difficulty falling and staying asleep. Clonazepam also helps her leg spasms as well as helps her to fall asleep.   Initially, does note progressive help her sleep but now she is having a lot of incontinence again for   Mood/cognition:   She notes mild depression and anxiety.    She prefers not to take an antidepressant.    She rarely takes Xanax 0.5 mg when she has more anxiety. She notes more trouble with cognition, especially short term  memory.    Adderall helps focus some    MS History:  She presented with optic neuritis in 1991 followed shortly by difficulties with leg weakness and by right trigeminal neuralgia. In retrospect, couple years earlier when she had her daughter, has some difficulties with her legs and needed to go on short-term disability. At first she was not diagnosed with MS but after more of the symptoms she underwent MRI testing and had a lumbar puncture by Dr. Johnnye Sima. The imaging and the CSF was consistent with multiple sclerosis. When Betaseron became available she was placed on that. She was on Betaseron for about 10 years. She felt that she did not have too many exacerbations during that time but she had a lot of difficulty tolerating the Betaseron due to skin reactions. Around 12-15 years ago, she started to use a cane and she has had progressive gait disturbance over the last decade.  Around the house for short distances, she uses a walker but uses her scooter for longer distances. Outside she uses a wheelchair pushed by others   REVIEW OF SYSTEMS:  Constitutional: No fevers, chills, sweats, or change in appetite.  She has Fatigue Eyes: No visual changes, double vision, eye pain Ear, nose and throat: No hearing loss, ear pain, nasal congestion, sore throat Cardiovascular: No chest pain, palpitations Respiratory:  No shortness of breath at rest or with exertion.   No wheezes GastrointestinaI: Mild dysphagia.  Constipation.  No nausea, vomiting, diarrhea.   Genitourinary:  No dysuria but has urinary retention and frequency.  Desmopressin helps nocturia. Musculoskeletal:  No neck pain,   Has lower back pain and buttocks.  Hips ans other joints ok Integumentary: No rash, pruritus, skin lesions Neurological: as above Psychiatric: see above Endocrine: No palpitations, diaphoresis, change in appetite, change in weigh or increased thirst Hematologic/Lymphatic:  No anemia, purpura, petechiae. Allergic/Immunologic: No itchy/runny eyes, nasal congestion, recent allergic reactions, rashes  ALLERGIES: Allergies  Allergen Reactions  . Codeine Nausea And Vomiting    HOME MEDICATIONS: Outpatient Medications Prior to Visit  Medication Sig Dispense Refill  . ALPRAZolam (XANAX) 0.5 MG tablet Take 0.5 mg by mouth daily as needed for anxiety.     Marland Kitchen amphetamine-dextroamphetamine (ADDERALL) 20 MG tablet Take 1 tablet (20 mg total) by mouth 2 (two) times daily. 60 tablet 0  . carbamazepine (TEGRETOL XR) 200 MG 12 hr tablet Take 1 tablet (200 mg total) by mouth 2 (two) times daily. 60 tablet 3  . cholecalciferol (VITAMIN D) 1000 units tablet Take 1,000 Units by mouth daily.    . clonazePAM (KLONOPIN) 1 MG tablet TAKE 1 TABLET BY MOUTH EVERY NIGHT AT BEDTIME AS NEEDED FOR ANXIETY (Patient taking differently: Take 1 mg by mouth at bedtime as needed for anxiety. TAKE 1 TABLET BY MOUTH EVERY  NIGHT AT BEDTIME AS NEEDED FOR ANXIETY) 30 tablet 5  . desmopressin (DDAVP) 0.1 MG tablet Take 1 tablet (0.1 mg total) by mouth at bedtime as needed (bladder.). 30 tablet 11  . diazepam (VALIUM) 5 MG tablet Take 1 tablet (5 mg total) by mouth every 8 (eight) hours as needed. for anxiety (Patient taking differently: Take 5 mg by mouth every 8 (eight) hours as needed for anxiety. ) 90 tablet 3  . doxepin (SINEQUAN) 10 MG capsule TAKE 1 CAPSULE(10 MG) BY MOUTH AT BEDTIME AS NEEDED (Patient taking differently: TAKE 1 CAPSULE(10 MG) BY MOUTH AT BEDTIME AS NEEDED FOR SLEEP) 90 capsule 3  . estradiol (CLIMARA - DOSED IN MG/24 HR)  0.075 mg/24hr patch Place 0.075 mg onto the skin once a week. Put on Saturdays.  10  . gabapentin (NEURONTIN) 800 MG tablet Take 1 tablet (800 mg total) by mouth 4 (four) times daily. 120 tablet 11  . ibuprofen (ADVIL,MOTRIN) 200 MG tablet Take 400 mg by mouth every 6 (six) hours as needed for headache (headache).    . lamoTRIgine (LAMICTAL) 200 MG tablet Take 200 mg by mouth 2 (two) times daily.   5  . meclizine (ANTIVERT) 25 MG tablet Take 1 tablet (25 mg total) by mouth 3 (three) times daily as needed for dizziness. 30 tablet 0  . ocrelizumab 600 mg in sodium chloride 0.9 % 500 mL Inject 600 mg into the vein every 6 (six) months.    . promethazine (PHENERGAN) 25 MG tablet Take 1 tablet (25 mg total) by mouth every 6 (six) hours as needed for nausea (nausea). 30 tablet 3  . tapentadol (NUCYNTA) 50 MG tablet Take 1 tablet (50 mg total) by mouth 3 (three) times daily as needed for moderate pain or severe pain (pain). 90 tablet 0  . traMADol (ULTRAM) 50 MG tablet TAKE 1 TABLET BY MOUTH THREE TIMES DAILY AS NEEDED FOR MODERATE PAIN (Patient taking differently: Take 50 mg by mouth 3 (three) times daily as needed for moderate pain or severe pain. TAKE 1 TABLET BY MOUTH THREE TIMES DAILY AS NEEDED FOR MODERATE PAIN) 90 tablet 5  . zolpidem (AMBIEN) 10 MG tablet Take 1 tablet (10 mg total)  by mouth at bedtime as needed for sleep (sleep). 30 tablet 5  . methylPREDNISolone (MEDROL DOSEPAK) 4 MG TBPK tablet Take 6 tablets on day 1, 5 tablets on day 2, 4 tablets on day 3, 3 tablets on day 4, 2 tablets on day 5, and 1 tablet on day 6 21 tablet 0   No facility-administered medications prior to visit.     PAST MEDICAL HISTORY: Past Medical History:  Diagnosis Date  . Anxiety   . Depression   . Gait instability    uses wheelchair or assistance  . H/O steroid therapy    IV infusion every 6 to 8 weeks- last 05-31-14 [facility]  . History of breast biopsy   . Multiple sclerosis ([place])   . Neuromuscular disorder ([place])    MS  . PONV (postoperative nausea and vomiting)   . Skin cancer     PAST SURGICAL HISTORY: Past Surgical History:  Procedure Laterality Date  . ABDOMINAL HYSTERECTOMY    . BREAST BIOPSY Right 09/20/2012   Procedure: BREAST BIOPSY WITH NEEDLE LOCALIZATION;  Surgeon: Rolm Bookbinder, MD;  Location: [facility];  Service: General;  Laterality: Right;  . CESAREAN SECTION    . COLONOSCOPY WITH PROPOFOL N/A 07/13/2014   Procedure: COLONOSCOPY WITH PROPOFOL;  Surgeon: Beryle Beams, MD;  Location: [facility];  Service: Endoscopy;  Laterality: N/A;  . DEBRIDEMENT SKIN / SQ / MUSCLE OF ARM Right 07/13/2003   I & D; debridement of tissue within triceps muscle  . LEG SURGERY     sx as a child  . MELANOMA EXCISION    . PARTIAL HYSTERECTOMY    . TONSILLECTOMY     as a child    FAMILY HISTORY: Family History  Problem Relation Age of Onset  . Hyperlipidemia Mother   . Hypertension Mother   . Hypertension Father   . Diabetes type II Father     SOCIAL HISTORY:  Social History   Social History  .  Marital status: Married    Spouse name: N/A  . Number of children: N/A  . Years of education: N/A   Occupational History  . Not on file.   Social History Main Topics  . Smoking status: Never Smoker  . Smokeless tobacco: Never Used    . Alcohol use Yes     Comment: social  . Drug use: No  . Sexual activity: Not on file   Other Topics Concern  . Not on file   Social History Narrative   Lives at home w/ her husband     PHYSICAL EXAM  Vitals:   02/02/17 1048  BP: 129/82  Pulse: 88  Weight: 136 lb (61.7 kg)  Height: 5\' 5"  (1.651 m)    Body mass index is 22.63 kg/m.   General: The patient is well-developed and well-nourished and in no acute distress  HEENT:   [place]/AT,   Oropharynx was normal.   Tympanic membranes intact. Ear canals are normal.  Skin/Ext/Musculoskeletal: Mild pedal edema.   She is mildly tender over the piriformis muscle on the right. She is mildly tender over the right trochanteric bursa    Neurologic Exam  Mental status: The patient is alert and oriented x 3 at the time of the examination. The patient has apparent normal recent and remote memory, with an apparently normal attention span and concentration ability.   Speech is normal.  Cranial nerves: Extraocular movements are full. She has nystagmus with the Dix-Hallpike maneuver bilaterally, though worse when the right ear was down. There is good facial sensation to soft touch bilaterally.Facial strength is normal.  Trapezius and sternocleidomastoid strength is normal. No dysarthria is noted.  No obvious hearing deficits are noted.  Motor:  Muscle bulk and tone are normal. Strength is  4- / 5 in proximal legs and 4/5 distally, worse on left. Left and right arm are similar.    She uses arms to get out of chair  Sensory: Sensory testing is intact to soft touch, vibration sensation in arms but mild decreased touch in left leg.   .  Coordination: Cerebellar testing reveals reduced finger-nose-finger and poor heel-to-shin bilaterally.  Gait and station: She needs to use arms to stand.   Her station is unsteady and she cannot support her weight for more than a few seconds. She is unable to walk without strong bilateral support.    She transfers  independently  Reflexes: Deep tendon reflexes are symmetric and increased in legs bilaterally with spread at the knees.  She has non-sustained ankle clonus bilaterally.     DIAGNOSTIC DATA (LABS, IMAGING, TESTING) - I reviewed patient records, labs, notes, testing and imaging myself where available.  Lab Results  Component Value Date   WBC 12.9 (H) 01/23/2017   HGB 13.3 01/23/2017   HCT 40.1 01/23/2017   MCV 94.1 01/23/2017   PLT 252 01/23/2017      Component Value Date/Time   NA 139 01/23/2017 1531   K 3.4 (L) 01/23/2017 1531   CL 99 (L) 01/23/2017 1531   CO2 29 01/23/2017 1531   GLUCOSE 119 (H) 01/23/2017 1531   BUN 15 01/23/2017 1531   CREATININE 0.58 01/23/2017 1531   CALCIUM 9.5 01/23/2017 1531   PROT 6.6 10/01/2015 1351   ALBUMIN 4.5 10/01/2015 1351   AST 11 10/01/2015 1351   ALT 11 10/01/2015 1351   ALKPHOS 62 10/01/2015 1351   BILITOT 0.4 10/01/2015 1351   GFRNONAA >60 01/23/2017 1531   GFRAA >60 01/23/2017 1531  ASSESSMENT AND PLAN  Multiple sclerosis ([place]) - Plan: Ambulatory referral to Urology, MR BRAIN W WO CONTRAST  Vertigo  Ataxic gait  Urinary dysfunction - Plan: Ambulatory referral to Urology  Dysesthesia  Depression with anxiety  Left leg weakness   1.   Continue ocrelizumab.  Her next infusion will be September. 2.   She will continue lamotrigine and gabapentin for the dysesthesias. 3.   Epley maneuver starting on the right 4.   Refer to urology for urinary dysfunction. She has hesitancy as well as urgency and frequency and will likely need urodynamics to help guide therapy.   Tamsulosin did not help. 5.    She will return to see me in 4 months or sooner if she has new or worsening neurologic symptoms.  Richard A. Felecia Shelling, MD, PhD 0/12/2255, 50:51 AM Certified in Neurology, Clinical Neurophysiology, Sleep Medicine, Pain Medicine and Neuroimaging  [facility] [address], [place], [place] [address] [phone] /

## 2017-02-03 NOTE — Telephone Encounter (Signed)
I called Nicolette with [facility] again, gave her the orders for skilled nursing 1x4, and 1 as needed, and for OT, PT, and social work consult, per Dr. Felecia Shelling.

## 2017-02-22 ENCOUNTER — Other Ambulatory Visit: Payer: Self-pay | Admitting: Neurology

## 2017-03-02 ENCOUNTER — Other Ambulatory Visit: Payer: Self-pay | Admitting: Neurology

## 2017-03-03 ENCOUNTER — Encounter: Payer: Self-pay | Admitting: *Deleted

## 2017-03-03 DIAGNOSIS — G35 Multiple sclerosis: Secondary | ICD-10-CM | POA: Diagnosis not present

## 2017-03-10 DIAGNOSIS — Z85828 Personal history of other malignant neoplasm of skin: Secondary | ICD-10-CM | POA: Diagnosis not present

## 2017-03-10 DIAGNOSIS — L718 Other rosacea: Secondary | ICD-10-CM | POA: Diagnosis not present

## 2017-03-23 ENCOUNTER — Ambulatory Visit: Payer: Medicare Other | Admitting: Neurology

## 2017-03-24 ENCOUNTER — Telehealth: Payer: Self-pay | Admitting: Neurology

## 2017-03-24 MED ORDER — TAPENTADOL HCL 50 MG PO TABS: 50.0000 mg | ORAL_TABLET | Freq: Three times a day (TID) | ORAL | 0 refills | Status: DC | PRN

## 2017-03-24 MED ORDER — ZOLPIDEM TARTRATE 10 MG PO TABS: 10.0000 mg | ORAL_TABLET | Freq: Every evening | ORAL | 0 refills | Status: DC | PRN

## 2017-03-24 MED ORDER — AMPHETAMINE-DEXTROAMPHETAMINE 20 MG PO TABS: 20.0000 mg | ORAL_TABLET | Freq: Two times a day (BID) | ORAL | 0 refills | Status: DC

## 2017-03-24 NOTE — Telephone Encounter (Signed)
Rx's up front [facility]/fim 

## 2017-03-24 NOTE — Telephone Encounter (Signed)
Rx's awaiting [facility] sig/fim 

## 2017-03-24 NOTE — Telephone Encounter (Signed)
Patient requesting refill of amphetamine-dextroamphetamine (ADDERALL) 20 MG tablet, tapentadol (NUCYNTA) 50 MG tablet and zolpidem (AMBIEN) 10 MG tablet.

## 2017-03-24 NOTE — Addendum Note (Signed)
Addended by: France Ravens I on: 03/24/2017 02:47 PM   Modules accepted: Orders

## 2017-03-25 ENCOUNTER — Ambulatory Visit: Payer: Medicare Other | Admitting: Neurology

## 2017-03-25 ENCOUNTER — Telehealth: Payer: Self-pay | Admitting: *Deleted

## 2017-03-25 DIAGNOSIS — L570 Actinic keratosis: Secondary | ICD-10-CM | POA: Diagnosis not present

## 2017-03-25 DIAGNOSIS — Z85828 Personal history of other malignant neoplasm of skin: Secondary | ICD-10-CM | POA: Diagnosis not present

## 2017-03-25 DIAGNOSIS — L821 Other seborrheic keratosis: Secondary | ICD-10-CM | POA: Diagnosis not present

## 2017-03-25 DIAGNOSIS — L814 Other melanin hyperpigmentation: Secondary | ICD-10-CM | POA: Diagnosis not present

## 2017-03-25 NOTE — Telephone Encounter (Signed)
PA for Nucynta 50mg  tablets #90/30 completed by phone with Fransisco Beau with [facility]. Dx: MS (G35), Dysesthesias (R20.8), Right hip bursitis (M70.61). Tried and failed meds: Tramadol 50mg , Gabapentin 800mg , Baclofen 10mg , Ibuprofen 800mg , Demerol 50mg , Lyrica 150mg  and 300mg , Hydrocodone 5/325mg , Oxycodone 5mg [name]

## 2017-03-29 NOTE — Telephone Encounter (Signed)
Fax received from [facility] [place] (phone # [phone]).  Nucynta approved thru 03/25/2018.  member# [id number].  No PA # given/fim

## 2017-03-31 NOTE — Telephone Encounter (Signed)
Fax received from [facility] [place] (phone# [phone].  Nucynta approved thru 03/25/18.  Member# [id number].  No PA#/fim

## 2017-04-25 ENCOUNTER — Other Ambulatory Visit: Payer: Self-pay | Admitting: Neurology

## 2017-04-29 ENCOUNTER — Other Ambulatory Visit: Payer: Self-pay | Admitting: Nurse Practitioner

## 2017-04-29 ENCOUNTER — Encounter: Payer: Self-pay | Admitting: Neurology

## 2017-04-29 ENCOUNTER — Ambulatory Visit (INDEPENDENT_AMBULATORY_CARE_PROVIDER_SITE_OTHER): Payer: Medicare Other | Admitting: Neurology

## 2017-04-29 VITALS — BP 136/84 | HR 71 | Resp 18 | Ht 61.0 in | Wt 136.0 lb

## 2017-04-29 DIAGNOSIS — M7061 Trochanteric bursitis, right hip: Secondary | ICD-10-CM

## 2017-04-29 DIAGNOSIS — M5431 Sciatica, right side: Secondary | ICD-10-CM

## 2017-04-29 DIAGNOSIS — H8112 Benign paroxysmal vertigo, left ear: Secondary | ICD-10-CM

## 2017-04-29 DIAGNOSIS — R260 Ataxic gait: Secondary | ICD-10-CM

## 2017-04-29 DIAGNOSIS — R208 Other disturbances of skin sensation: Secondary | ICD-10-CM

## 2017-04-29 DIAGNOSIS — R39198 Other difficulties with micturition: Secondary | ICD-10-CM | POA: Diagnosis not present

## 2017-04-29 DIAGNOSIS — G35 Multiple sclerosis: Secondary | ICD-10-CM | POA: Diagnosis not present

## 2017-04-29 MED ORDER — AMPHETAMINE-DEXTROAMPHETAMINE 20 MG PO TABS: 20.0000 mg | ORAL_TABLET | Freq: Two times a day (BID) | ORAL | 0 refills | Status: DC

## 2017-04-29 MED ORDER — CLONAZEPAM 1 MG PO TABS: ORAL_TABLET | ORAL | 5 refills | Status: DC

## 2017-04-29 NOTE — Progress Notes (Signed)
[facility]  PATIENT: Tonya Powers DOB: [birth year]  REFERRING CLINICIAN: Mayra Powers is PCP HISTORY FROM: Patient  REASON FOR VISIT: MS and poor gait   HISTORICAL  CHIEF COMPLAINT:  Chief Complaint  Patient presents with  . Multiple Sclerosis    Last Ocrevus was 08/11/16.  She does not want to continue it. Still has bouts of Vertigo.  Sts. walking is worse--can walk a few feet with walker. Would like r/f of Zofran.  Takes Phenergan at night and Zofran during the day./fim    HISTORY OF PRESENT ILLNESS:  Tonya Powers is a 56 year old woman with MS.      Update 04/29/2017: She felt more weakness and vertigo on Ocrelizumab and she has stopped.   She is not currently on any DMT.   She takes steroids IV every month or two and notes improvement of fatigue and general strength. She has left > right leg weakness and spasticity.   She feels gait is worse and she has more trouble lifting the left leg.   With a walker she can go 10-20 feet and she uses a scooter mostly.     She can transfer though this is sometimes difficult and she has had falls.   Ampyra had not helped.    She has dysesthetic pain helped by tramadol, gabapentin and lamotrigine.   She uses Nucynta sparingly.    She still has bouts of severe vertigo.   She does Epley maneuvers when it reoccurs.     She has low back pain that intensifies woith prolonged siting.   Solanpas lidocaine patches help some.   Piriformis shots have also helped.    She has fatigue daily.  Adderall has greatly helped her fatigue and poor focus.   Ambien, clonazepam and doxepin help her sleep but without one of them, sleep is worse.      From 02/02/2017:   Vertigo:    She has had vertigo x 15 days.   She has been on meclizine, phenergan, Medrol dosepak, clonazepam (not sure if helped but sleeps better)and IV Solu-Medrol.  Dramamine may have helped slightly.   She has vertigo while still and has diplopia.    Initially she also had N/V.   The diplopia is worse with movements.     She went to the ED and had a Morphine shot when pain was worse and also had IV fluids.     MS:   She is back on the Ocrevus.   She thinks her MS is probably stable but is concerned that the vertigo might be a new exacerbation.Marland Kitchen    She was on Tysabri and was stable but converted to JCV Ab positive and stopped.   She could not tolerate Tecfidera.        Gait/strength :    Her gait is very poor and she mostly uses a scooter. She can use a walker for a few steps (probably up to 20 feet).     She has left > right leg weakness and spasticity.       Dysesthesia/hip pain:   She continues to report burning pain in the right leg and right flank lamotrigine has only helps the pain a little bit.    Tegretol and gabapentin have not helped much.   TCA's have not helped.     Baclofen made her feel weaker and she stopped.     Nucynta is too expensive.   Tramadol has not helped much   Bladder/bowel:  She has a difficult bladder dysfunction with frequent incontinence. She has quite a bit of hesitancy, worse when she uses the opiates (now off of these).    Tamsulosin did not really help the hesitancy. Desmopressin helps her to the bed at night to help her more. She still has frequent incontinence.     She reports constipation. Linzess did not help.    Nucynta does not worsen constipation like opiates did but is very expensive.  Fatigue/sleep:   She has fatigue most days, worse with heat. Adderall has helped.   She does well doing pool exercises.,  When fatiue is more severe she takes  Solu-Medrol infusions.   She has both sleep onset and sleep maintenance insomnia.   She takes clonazepam 1 mg and Ambien every night due to difficulty falling and staying asleep. Clonazepam also helps her leg spasms as well as helps her to fall asleep.   Initially, does note progressive help her sleep but now she is having a lot of incontinence again for   Mood/cognition:   She notes mild depression  and anxiety.    She prefers not to take an antidepressant.    She rarely takes Xanax 0.5 mg when she has more anxiety. She notes more trouble with cognition, especially short term memory.    Adderall helps focus some    MS History:  She presented with optic neuritis in 1991 followed shortly by difficulties with leg weakness and by right trigeminal neuralgia. In retrospect, couple years earlier when she had her daughter, has some difficulties with her legs and needed to go on short-term disability. At first she was not diagnosed with MS but after more of the symptoms she underwent MRI testing and had a lumbar puncture by Dr. Johnnye Powers. The imaging and the CSF was consistent with multiple sclerosis. When Betaseron became available she was placed on that. She was on Betaseron for about 10 years. She felt that she did not have too many exacerbations during that time but she had a lot of difficulty tolerating the Betaseron due to skin reactions. Around 12-15 years ago, she started to use a cane and she has had progressive gait disturbance over the last decade. Around the house for short distances, she uses a walker but uses her scooter for longer distances. Outside she uses a wheelchair pushed by others   REVIEW OF SYSTEMS:  Constitutional: No fevers, chills, sweats, or change in appetite.  She has Fatigue Eyes: No visual changes, double vision, eye pain Ear, nose and throat: No hearing loss, ear pain, nasal congestion, sore throat Cardiovascular: No chest pain, palpitations Respiratory:  No shortness of breath at rest or with exertion.   No wheezes GastrointestinaI: Mild dysphagia.  Constipation.  No nausea, vomiting, diarrhea.   Genitourinary:  No dysuria but has urinary retention and frequency.  Desmopressin helps nocturia. Musculoskeletal:  No neck pain,   Has lower back pain and buttocks.  Hips ans other joints ok Integumentary: No rash, pruritus, skin lesions Neurological: as above Psychiatric: see  above Endocrine: No palpitations, diaphoresis, change in appetite, change in weigh or increased thirst Hematologic/Lymphatic:  No anemia, purpura, petechiae. Allergic/Immunologic: No itchy/runny eyes, nasal congestion, recent allergic reactions, rashes  ALLERGIES: Allergies  Allergen Reactions  . Codeine Nausea And Vomiting    HOME MEDICATIONS: Outpatient Medications Prior to Visit  Medication Sig Dispense Refill  . ALPRAZolam (XANAX) 0.5 MG tablet Take 0.5 mg by mouth daily as needed for anxiety.     . cholecalciferol (  VITAMIN D) 1000 units tablet Take 1,000 Units by mouth daily.    Marland Kitchen desmopressin (DDAVP) 0.1 MG tablet Take 1 tablet (0.1 mg total) by mouth at bedtime as needed (bladder.). 30 tablet 11  . diazepam (VALIUM) 5 MG tablet Take 1 tablet (5 mg total) by mouth every 8 (eight) hours as needed. for anxiety (Patient taking differently: Take 5 mg by mouth every 8 (eight) hours as needed for anxiety. ) 90 tablet 3  . doxepin (SINEQUAN) 10 MG capsule TAKE ONE CAPSULE BY MOUTH EVERY NIGHT AT BEDTIME AS NEEDED 90 capsule 0  . estradiol (CLIMARA - DOSED IN MG/24 HR) 0.075 mg/24hr patch Place 0.075 mg onto the skin once a week. Put on Saturdays.  10  . gabapentin (NEURONTIN) 800 MG tablet Take 1 tablet (800 mg total) by mouth 4 (four) times daily. 120 tablet 11  . ibuprofen (ADVIL,MOTRIN) 200 MG tablet Take 400 mg by mouth every 6 (six) hours as needed for headache (headache).    . lamoTRIgine (LAMICTAL) 200 MG tablet Take 200 mg by mouth 2 (two) times daily.   5  . meclizine (ANTIVERT) 25 MG tablet Take 1 tablet (25 mg total) by mouth 3 (three) times daily as needed for dizziness. 30 tablet 0  . ocrelizumab 600 mg in sodium chloride 0.9 % 500 mL Inject 600 mg into the vein every 6 (six) months.    . promethazine (PHENERGAN) 25 MG tablet Take 1 tablet (25 mg total) by mouth every 6 (six) hours as needed for nausea (nausea). 30 tablet 3  . tapentadol (NUCYNTA) 50 MG tablet Take 1 tablet (50  mg total) by mouth 3 (three) times daily as needed for moderate pain or severe pain (pain). 90 tablet 0  . traMADol (ULTRAM) 50 MG tablet TAKE 1 TABLET BY MOUTH THREE TIMES DAILY AS NEEDED FOR MODERATE PAIN (Patient taking differently: Take 50 mg by mouth 3 (three) times daily as needed for moderate pain or severe pain. TAKE 1 TABLET BY MOUTH THREE TIMES DAILY AS NEEDED FOR MODERATE PAIN) 90 tablet 5  . zolpidem (AMBIEN) 10 MG tablet TAKE 1 TABLET BY MOUTH AT BEDTIME AS NEEDED FOR SLEEP 30 tablet 5  . amphetamine-dextroamphetamine (ADDERALL) 20 MG tablet Take 1 tablet (20 mg total) by mouth 2 (two) times daily. 60 tablet 0  . clonazePAM (KLONOPIN) 1 MG tablet TAKE 1 TABLET BY MOUTH EVERY NIGHT AT BEDTIME AS NEEDED FOR ANXIETY (Patient taking differently: Take 1 mg by mouth at bedtime as needed for anxiety. TAKE 1 TABLET BY MOUTH EVERY NIGHT AT BEDTIME AS NEEDED FOR ANXIETY) 30 tablet 5  . carbamazepine (TEGRETOL XR) 200 MG 12 hr tablet Take 1 tablet (200 mg total) by mouth 2 (two) times daily. 60 tablet 3   No facility-administered medications prior to visit.     PAST MEDICAL HISTORY: Past Medical History:  Diagnosis Date  . Anxiety   . Depression   . Gait instability    uses wheelchair or assistance  . H/O steroid therapy    IV infusion every 6 to 8 weeks- last 05-31-14 [facility]  . History of breast biopsy   . Multiple sclerosis ([place])   . Neuromuscular disorder ([place])    MS  . PONV (postoperative nausea and vomiting)   . Skin cancer     PAST SURGICAL HISTORY: Past Surgical History:  Procedure Laterality Date  . ABDOMINAL HYSTERECTOMY    . BREAST BIOPSY Right 09/20/2012   Procedure: BREAST BIOPSY WITH NEEDLE LOCALIZATION;  Surgeon: Rolm Bookbinder, MD;  Location: [facility];  Service: General;  Laterality: Right;  . CESAREAN SECTION    . COLONOSCOPY WITH PROPOFOL N/A 07/13/2014   Procedure: COLONOSCOPY WITH PROPOFOL;  Surgeon: Beryle Beams, MD;   Location: [facility];  Service: Endoscopy;  Laterality: N/A;  . DEBRIDEMENT SKIN / SQ / MUSCLE OF ARM Right 07/13/2003   I & D; debridement of tissue within triceps muscle  . LEG SURGERY     sx as a child  . MELANOMA EXCISION    . PARTIAL HYSTERECTOMY    . TONSILLECTOMY     as a child    FAMILY HISTORY: Family History  Problem Relation Age of Onset  . Hyperlipidemia Mother   . Hypertension Mother   . Hypertension Father   . Diabetes type II Father     SOCIAL HISTORY:  Social History   Social History  . Marital status: Married    Spouse name: N/A  . Number of children: N/A  . Years of education: N/A   Occupational History  . Not on file.   Social History Main Topics  . Smoking status: Never Smoker  . Smokeless tobacco: Never Used  . Alcohol use Yes     Comment: social  . Drug use: No  . Sexual activity: Not on file   Other Topics Concern  . Not on file   Social History Narrative   Lives at home w/ her husband     PHYSICAL EXAM  Vitals:   04/29/17 1139  BP: 136/84  Pulse: 71  Resp: 18  Weight: 136 lb (61.7 kg)  Height: 5\' 1"  (1.549 m)    Body mass index is 25.7 kg/m.   General: The patient is well-developed and well-nourished and in no acute distress   Skin/Ext/Musculoskeletal: Mild pedal edema.   She has moderate tenderness over the piriformis muscle on the right. There is also moderate tenderness over the right trochanteric bursa. Neurologic Exam  Mental status: The patient is alert and oriented x 3 at the time of the examination. The patient has apparent normal recent and remote memory, with an apparently normal attention span and concentration ability.   Speech is normal.  Cranial nerves: Extraocular movements are full. Facial strength and sensation is normal. Trapezius strength is normal.. No dysarthria is noted.  No obvious hearing deficits are noted.  Motor:  Muscle bulk and tone are normal. Strength is  3/ 5 in proximal legs and 4- to  4/5 distally.  Left and right arm are strpng.    She uses arms to get out of chair  Sensory: Sensory testing is intact to soft touch, vibration sensation in arms but mild decreased touch in left leg.   .  Coordination: Cerebellar testing reveals reduced finger-nose-finger and poor heel-to-shin bilaterally.  Gait and station: She needs to use arms to stand.   Her station is unsteady and she cannot support her weight for more than a few seconds. She needs bilateral support to stand and take a few steps  Reflexes: Deep tendon reflexes are symmetric and increased in legs bilaterally with spread at the knees.  She has non-sustained ankle clonus bilaterally.     DIAGNOSTIC DATA (LABS, IMAGING, TESTING) - I reviewed patient records, labs, notes, testing and imaging myself where available.  Lab Results  Component Value Date   WBC 12.9 (H) 01/23/2017   HGB 13.3 01/23/2017   HCT 40.1 01/23/2017   MCV 94.1 01/23/2017   PLT 252  01/23/2017      Component Value Date/Time   NA 139 01/23/2017 1531   K 3.4 (L) 01/23/2017 1531   CL 99 (L) 01/23/2017 1531   CO2 29 01/23/2017 1531   GLUCOSE 119 (H) 01/23/2017 1531   BUN 15 01/23/2017 1531   CREATININE 0.58 01/23/2017 1531   CALCIUM 9.5 01/23/2017 1531   PROT 6.6 10/01/2015 1351   ALBUMIN 4.5 10/01/2015 1351   AST 11 10/01/2015 1351   ALT 11 10/01/2015 1351   ALKPHOS 62 10/01/2015 1351   BILITOT 0.4 10/01/2015 1351   GFRNONAA >60 01/23/2017 1531   GFRAA >60 01/23/2017 1531      ASSESSMENT AND PLAN  Multiple sclerosis (HCC)  Ataxic gait  Urinary dysfunction  Dysesthesia  Benign paroxysmal positional vertigo of left ear  Right sided sciatica  Trochanteric bursitis of right hip   1.   Stop ocrelizumab.  Continue monthly steroids and consider going back on an oral agent but she broke through [place] and prefers not to go back on.  We will need to reconsider this if she has another exacerbation.  2.   Right  piriformis TPI with 40 mg DepoMedrol in [place] using sterile technique. She tolerated the procedure well. There were no complications. 3.   Right trochanteric bursa with 40 mg DepoMedrol in [place] using sterile technique. She tolerated the procedure well. There were no complications. 4.    Recommend that she f/u with urology for urinary dysfunction. She has hesitancy as well as urgency and frequency and will likely need urodynamics to help guide therapy.   Tamsulosin did not help. 5.    She will return to see me in 4 months or sooner if she has new or worsening neurologic symptoms.  Richard A. Felecia Shelling, MD, PhD 29/02/3715, 9:67 PM Certified in Neurology, Clinical Neurophysiology, Sleep Medicine, Pain Medicine and Neuroimaging  [facility] [address], [place], [place] [address] [phone] /

## 2017-04-30 DIAGNOSIS — Z23 Encounter for immunization: Secondary | ICD-10-CM | POA: Diagnosis not present

## 2017-05-05 ENCOUNTER — Telehealth: Payer: Self-pay | Admitting: Neurology

## 2017-05-05 NOTE — Telephone Encounter (Signed)
Pt called she said at [address] Felecia Shelling discussed calling in a pain patch. She is wanting to know if it has been sent in? Please call to discuss

## 2017-05-06 MED ORDER — LIDOCAINE 5 % EX PTCH: 1.0000 | MEDICATED_PATCH | CUTANEOUS | 11 refills | Status: AC

## 2017-05-06 NOTE — Telephone Encounter (Signed)
I have spoken with Tonya Powers this afternoon.  Rx. for Lidoderm patches escribed to [facility] per her request/fim

## 2017-05-07 ENCOUNTER — Telehealth: Payer: Self-pay | Admitting: *Deleted

## 2017-05-07 NOTE — Telephone Encounter (Signed)
PA for Lidoderm 5% patches #30/30 completed via Cover My Meds.  Dx: Postherpetic Neuralgia/fim

## 2017-05-12 NOTE — Telephone Encounter (Signed)
Noted/fim 

## 2017-05-12 NOTE — Telephone Encounter (Signed)
Vanessa/BC [phone] opt 5 called medication has been approved thru 02/08/18

## 2017-05-19 DIAGNOSIS — G35 Multiple sclerosis: Secondary | ICD-10-CM | POA: Diagnosis not present

## 2017-05-27 ENCOUNTER — Other Ambulatory Visit: Payer: Self-pay | Admitting: Neurology

## 2017-06-09 ENCOUNTER — Other Ambulatory Visit: Payer: Self-pay | Admitting: Neurology

## 2017-06-10 ENCOUNTER — Encounter: Payer: Self-pay | Admitting: *Deleted

## 2017-06-24 ENCOUNTER — Other Ambulatory Visit: Payer: Self-pay | Admitting: Neurology

## 2017-06-30 ENCOUNTER — Other Ambulatory Visit: Payer: Self-pay | Admitting: Neurology

## 2017-07-01 ENCOUNTER — Telehealth: Payer: Self-pay | Admitting: Neurology

## 2017-07-01 MED ORDER — AMPHETAMINE-DEXTROAMPHETAMINE 20 MG PO TABS: 20.0000 mg | ORAL_TABLET | Freq: Two times a day (BID) | ORAL | 0 refills | Status: DC

## 2017-07-01 NOTE — Telephone Encounter (Signed)
Rx. up front [facility]/fim 

## 2017-07-01 NOTE — Addendum Note (Signed)
Addended by: France Ravens I on: 07/01/2017 02:08 PM   Modules accepted: Orders

## 2017-07-01 NOTE — Telephone Encounter (Signed)
Pt calling for a refill of amphetamine-dextroamphetamine (ADDERALL) 20 MG tablet

## 2017-07-01 NOTE — Telephone Encounter (Signed)
Rx. awaiting RAS sig/fim 

## 2017-07-01 NOTE — Telephone Encounter (Signed)
Pt aware the office closes at 12 noon on Fridays

## 2017-07-05 DIAGNOSIS — G35 Multiple sclerosis: Secondary | ICD-10-CM | POA: Diagnosis not present

## 2017-07-05 DIAGNOSIS — R270 Ataxia, unspecified: Secondary | ICD-10-CM | POA: Diagnosis not present

## 2017-07-05 DIAGNOSIS — Z0000 Encounter for general adult medical examination without abnormal findings: Secondary | ICD-10-CM | POA: Diagnosis not present

## 2017-07-05 DIAGNOSIS — G4700 Insomnia, unspecified: Secondary | ICD-10-CM | POA: Diagnosis not present

## 2017-07-06 ENCOUNTER — Telehealth: Payer: Self-pay | Admitting: Neurology

## 2017-07-06 ENCOUNTER — Other Ambulatory Visit: Payer: Self-pay | Admitting: Family Medicine

## 2017-07-06 DIAGNOSIS — Z139 Encounter for screening, unspecified: Secondary | ICD-10-CM

## 2017-07-06 NOTE — Telephone Encounter (Signed)
Deanna with [facility] is wanting a call back t discuss pts medication pt I.D. # is [id number]  Contact at [phone] may speak with anyone there

## 2017-07-07 NOTE — Telephone Encounter (Signed)
Pt. is no longer on Ocrelizumab.  If [facility] calls back, please let them know. Thank you!

## 2017-07-12 DIAGNOSIS — H00014 Hordeolum externum left upper eyelid: Secondary | ICD-10-CM | POA: Diagnosis not present

## 2017-07-22 ENCOUNTER — Other Ambulatory Visit: Payer: Self-pay | Admitting: Neurology

## 2017-07-23 ENCOUNTER — Other Ambulatory Visit: Payer: Self-pay | Admitting: Neurology

## 2017-07-26 ENCOUNTER — Telehealth: Payer: Self-pay | Admitting: Neurology

## 2017-07-26 MED ORDER — TRAMADOL HCL 50 MG PO TABS: ORAL_TABLET | ORAL | 1 refills | Status: DC

## 2017-07-26 NOTE — Addendum Note (Signed)
Addended by: Belinda Block A on: 07/26/2017 11:56 AM   Modules accepted: Orders

## 2017-07-26 NOTE — Telephone Encounter (Signed)
It looks like we have all the correct information (I believe Dr. Brett Fairy was the work in doctor), however, when Rx was ready to fax we faxed it to the incorrect pharmacy.

## 2017-07-26 NOTE — Telephone Encounter (Signed)
Rx faxed to [facility] and patient has been made aware.

## 2017-07-26 NOTE — Telephone Encounter (Signed)
Pt stating that all weekend long [facility] [address] on  Her traMADol (ULTRAM) 50 MG tablet.  Pt states this is prescribed by Dr Felecia Shelling and is to go to  [facility], [address] AT [address] [place] [phone] (Phone) [phone] (Fax)   Somehow a message was sent to Dr Brett Fairy and a [facility] was [facility] the [facility] that [facility] not use.  Please send to  [place], [address] AT [address] [phone] (Phone) [phone] (Fax)   Pt is asking for a call back

## 2017-07-27 ENCOUNTER — Ambulatory Visit
Admission: RE | Admit: 2017-07-27 | Discharge: 2017-07-27 | Disposition: A | Discharge status: Home / Self Care | Payer: Medicare Other | Source: Ambulatory Visit | Attending: Family Medicine | Admitting: Family Medicine

## 2017-07-27 DIAGNOSIS — Z1231 Encounter for screening mammogram for malignant neoplasm of breast: Secondary | ICD-10-CM | POA: Diagnosis not present

## 2017-07-27 DIAGNOSIS — Z139 Encounter for screening, unspecified: Secondary | ICD-10-CM

## 2017-08-06 DIAGNOSIS — Z85828 Personal history of other malignant neoplasm of skin: Secondary | ICD-10-CM | POA: Diagnosis not present

## 2017-08-06 DIAGNOSIS — L239 Allergic contact dermatitis, unspecified cause: Secondary | ICD-10-CM | POA: Diagnosis not present

## 2017-08-18 ENCOUNTER — Telehealth: Payer: Self-pay | Admitting: Neurology

## 2017-08-18 MED ORDER — AMPHETAMINE-DEXTROAMPHETAMINE 20 MG PO TABS: 20.0000 mg | ORAL_TABLET | Freq: Two times a day (BID) | ORAL | 0 refills | Status: DC

## 2017-08-18 NOTE — Telephone Encounter (Signed)
Pt requesting a refill for amphetamine-dextroamphetamine (ADDERALL) 20 MG tablet

## 2017-08-18 NOTE — Telephone Encounter (Signed)
Rx. up front [facility]/fim 

## 2017-08-19 DIAGNOSIS — G35 Multiple sclerosis: Secondary | ICD-10-CM | POA: Diagnosis not present

## 2017-08-27 ENCOUNTER — Other Ambulatory Visit: Payer: Self-pay | Admitting: Neurology

## 2017-08-30 ENCOUNTER — Telehealth: Payer: Self-pay | Admitting: *Deleted

## 2017-08-30 MED ORDER — DOXEPIN HCL 10 MG PO CAPS: ORAL_CAPSULE | ORAL | 0 refills | Status: DC

## 2017-08-30 MED ORDER — LAMOTRIGINE 200 MG PO TABS: 200.0000 mg | ORAL_TABLET | Freq: Two times a day (BID) | ORAL | 1 refills | Status: DC

## 2017-08-30 NOTE — Telephone Encounter (Signed)
Lamictal and Doxepin escribed to [facility] in response to faxed request from them/fim

## 2017-09-23 ENCOUNTER — Other Ambulatory Visit: Payer: Self-pay | Admitting: Neurology

## 2017-09-27 ENCOUNTER — Telehealth: Payer: Self-pay | Admitting: Neurology

## 2017-09-27 MED ORDER — TRAMADOL HCL 50 MG PO TABS: ORAL_TABLET | ORAL | 1 refills | Status: DC

## 2017-09-27 NOTE — Telephone Encounter (Signed)
Patient said refill for traMADol (ULTRAM) 50 MG tablet was denied at [facility] on [address]. Patient says she is completely out of medication. She would also like to discuss having an infusion. Please call.

## 2017-09-27 NOTE — Telephone Encounter (Signed)
Spoke with Tonya Powers.  She c/o increasing fatigue, related to increased activity surrounding her dtr's wedding. Per RAS, ok for SM 1gm IV once this wk. The infusion suite does not currently have SM--it has been ordered, so infusion sched. for 09/30/17 at approx. 1, 1:30pm, and they will call her if SM has not come in by then.  Tramadol is due, and rx. faxed to [facility] per her request./fim

## 2017-09-27 NOTE — Addendum Note (Signed)
Addended by: France Ravens I on: 09/27/2017 11:25 AM   Modules accepted: Orders

## 2017-09-28 DIAGNOSIS — G35 Multiple sclerosis: Secondary | ICD-10-CM | POA: Diagnosis not present

## 2017-10-11 ENCOUNTER — Other Ambulatory Visit: Payer: Self-pay | Admitting: Neurology

## 2017-10-11 MED ORDER — AMPHETAMINE-DEXTROAMPHETAMINE 20 MG PO TABS: 20.0000 mg | ORAL_TABLET | Freq: Two times a day (BID) | ORAL | 0 refills | Status: DC

## 2017-10-11 NOTE — Addendum Note (Signed)
Addended by: France Ravens I on: 10/11/2017 09:40 AM   Modules accepted: Orders

## 2017-10-11 NOTE — Telephone Encounter (Signed)
Patient requesting refill of amphetamine-dextroamphetamine (ADDERALL) 20 MG tablet.

## 2017-10-26 ENCOUNTER — Other Ambulatory Visit: Payer: Self-pay | Admitting: Neurology

## 2017-11-11 DIAGNOSIS — G35 Multiple sclerosis: Secondary | ICD-10-CM | POA: Diagnosis not present

## 2017-11-16 ENCOUNTER — Other Ambulatory Visit: Payer: Self-pay | Admitting: Neurology

## 2017-11-23 ENCOUNTER — Other Ambulatory Visit: Payer: Self-pay | Admitting: Neurology

## 2017-11-24 DIAGNOSIS — R21 Rash and other nonspecific skin eruption: Secondary | ICD-10-CM | POA: Diagnosis not present

## 2017-11-29 ENCOUNTER — Other Ambulatory Visit: Payer: Self-pay | Admitting: Neurology

## 2017-11-29 MED ORDER — AMPHETAMINE-DEXTROAMPHETAMINE 20 MG PO TABS: 20.0000 mg | ORAL_TABLET | Freq: Two times a day (BID) | ORAL | 0 refills | Status: DC

## 2017-11-29 NOTE — Addendum Note (Signed)
Addended by: France Ravens I on: 11/29/2017 09:32 AM   Modules accepted: Orders

## 2017-11-29 NOTE — Telephone Encounter (Signed)
Patient requesting refill of amphetamine-dextroamphetamine (ADDERALL) 20 MG tablet sent to [facility] on [address] @ [address]

## 2017-12-06 ENCOUNTER — Other Ambulatory Visit: Payer: Self-pay | Admitting: Neurology

## 2017-12-16 ENCOUNTER — Other Ambulatory Visit: Payer: Self-pay | Admitting: Neurology

## 2017-12-23 ENCOUNTER — Other Ambulatory Visit: Payer: Self-pay | Admitting: Neurology

## 2017-12-29 ENCOUNTER — Other Ambulatory Visit: Payer: Self-pay | Admitting: Neurology

## 2018-01-03 ENCOUNTER — Other Ambulatory Visit: Payer: Self-pay

## 2018-01-03 ENCOUNTER — Encounter: Payer: Self-pay | Admitting: Neurology

## 2018-01-03 ENCOUNTER — Ambulatory Visit: Payer: Medicare Other | Admitting: Neurology

## 2018-01-03 VITALS — BP 117/79 | HR 73 | Resp 16 | Ht 61.0 in | Wt 136.0 lb

## 2018-01-03 DIAGNOSIS — M7061 Trochanteric bursitis, right hip: Secondary | ICD-10-CM

## 2018-01-03 DIAGNOSIS — M5431 Sciatica, right side: Secondary | ICD-10-CM

## 2018-01-03 DIAGNOSIS — R29898 Other symptoms and signs involving the musculoskeletal system: Secondary | ICD-10-CM | POA: Diagnosis not present

## 2018-01-03 DIAGNOSIS — R208 Other disturbances of skin sensation: Secondary | ICD-10-CM

## 2018-01-03 DIAGNOSIS — F418 Other specified anxiety disorders: Secondary | ICD-10-CM

## 2018-01-03 DIAGNOSIS — R5382 Chronic fatigue, unspecified: Secondary | ICD-10-CM | POA: Diagnosis not present

## 2018-01-03 DIAGNOSIS — G35 Multiple sclerosis: Secondary | ICD-10-CM | POA: Diagnosis not present

## 2018-01-03 DIAGNOSIS — R238 Other skin changes: Secondary | ICD-10-CM

## 2018-01-03 DIAGNOSIS — R600 Localized edema: Secondary | ICD-10-CM

## 2018-01-03 DIAGNOSIS — F5104 Psychophysiologic insomnia: Secondary | ICD-10-CM

## 2018-01-03 DIAGNOSIS — R260 Ataxic gait: Secondary | ICD-10-CM

## 2018-01-03 MED ORDER — OXCARBAZEPINE 300 MG PO TABS
ORAL_TABLET | ORAL | 11 refills | Status: DC
Start: 2018-01-03 — End: 2018-03-10

## 2018-01-03 MED ORDER — AMPHETAMINE-DEXTROAMPHETAMINE 20 MG PO TABS: 20.0000 mg | ORAL_TABLET | Freq: Two times a day (BID) | ORAL | 0 refills | Status: DC

## 2018-01-03 MED ORDER — CLONAZEPAM 1 MG PO TABS: 1.0000 mg | ORAL_TABLET | Freq: Every evening | ORAL | 5 refills | Status: DC | PRN

## 2018-01-03 NOTE — Progress Notes (Signed)
[facility]  PATIENT: Tonya Powers DOB: 1961-05-11  REFERRING CLINICIAN: Mayra Neer is PCP HISTORY FROM: Patient  REASON FOR VISIT: MS and poor gait   HISTORICAL  CHIEF COMPLAINT:  Chief Complaint  Patient presents with  . Multiple Sclerosis    Continues off of dmt/fim    HISTORY OF PRESENT ILLNESS:  Tonya Powers is a 57 year old woman with MS.      Update 01/03/2018: She is noting more difficulty with her painful dysesthesias.  The wrose pain is in her right hip and left > right  legs but she has skin dysesthestic pain from her chest down (not much in arms).    Her feet get very red with swelling as the day goes on.     She is on lamotrigine 200 mg po bid, gabapentin 800 mg po qid and Nucynta (prn only)    She iuses her scooter mostly and can go 20 feet or so with her walker but not repeatedly as it wears her out.   She has left > right leg weakness.   Arms are strong.    She has insomnia and takes clonazepam and doxepin nightly.   She also has anxiety (not depression) and will occ take a valium if this is more of a problem.      Update 04/29/2017: She felt more weakness and vertigo on Ocrelizumab and she has stopped.   She is not currently on any DMT.   She takes steroids IV every month or two and notes improvement of fatigue and general strength. She has left > right leg weakness and spasticity.   She feels gait is worse and she has more trouble lifting the left leg.   With a walker she can go 10-20 feet and she uses a scooter mostly.     She can transfer though this is sometimes difficult and she has had falls.   Ampyra had not helped.    She has dysesthetic pain helped by tramadol, gabapentin and lamotrigine.   She uses Nucynta sparingly.    She still has bouts of severe vertigo.   She does Epley maneuvers when it reoccurs.     She has low back pain that intensifies woith prolonged siting.   Solanpas lidocaine patches help some.   Piriformis shots have  also helped.    She has fatigue daily.  Adderall has greatly helped her fatigue and poor focus.  She has trouble getting comfortable at night according insomnia.  Ambien, clonazepam and doxepin help her sleep but without one of them, sleep is worse.        From 02/02/2017:   Vertigo:    She has had vertigo x 15 days.   She has been on meclizine, phenergan, Medrol dosepak, clonazepam (not sure if helped but sleeps better)and IV Solu-Medrol.  Dramamine may have helped slightly.   She has vertigo while still and has diplopia.    Initially she also had N/V.  The diplopia is worse with movements.     She went to the ED and had a Morphine shot when pain was worse and also had IV fluids.     MS:   She is back on the Ocrevus.   She thinks her MS is probably stable but is concerned that the vertigo might be a new exacerbation.Marland Kitchen    She was on Tysabri and was stable but converted to JCV Ab positive and stopped.   She could not tolerate Tecfidera.  Gait/strength :    Her gait is very poor and she mostly uses a scooter. She can use a walker for a few steps (probably up to 20 feet).     She has left > right leg weakness and spasticity.       Dysesthesia/hip pain:   She continues to report burning pain in the right leg and right flank lamotrigine has only helps the pain a little bit.    Tegretol and gabapentin have not helped much.   TCA's have not helped.     Baclofen made her feel weaker and she stopped.     Nucynta is too expensive.   Tramadol has not helped much   Bladder/bowel:  She has a difficult bladder dysfunction with frequent incontinence. She has quite a bit of hesitancy, worse when she uses the opiates (now off of these).    Tamsulosin did not really help the hesitancy. Desmopressin helps her to the bed at night to help her more. She still has frequent incontinence.     She reports constipation. Linzess did not help.    Nucynta does not worsen constipation like opiates did but is very  expensive.  Fatigue/sleep:   She has fatigue most days, worse with heat. Adderall has helped.   She does well doing pool exercises.,  When fatiue is more severe she takes  Solu-Medrol infusions.   She has both sleep onset and sleep maintenance insomnia.   She takes clonazepam 1 mg and Ambien every night due to difficulty falling and staying asleep. Clonazepam also helps her leg spasms as well as helps her to fall asleep.   Initially, does note progressive help her sleep but now she is having a lot of incontinence again for   Mood/cognition:   She notes mild depression and anxiety.    She prefers not to take an antidepressant.    She rarely takes Xanax 0.5 mg when she has more anxiety. She notes more trouble with cognition, especially short term memory.    Adderall helps focus some    MS History:  She presented with optic neuritis in 1991 followed shortly by difficulties with leg weakness and by right trigeminal neuralgia. In retrospect, couple years earlier when she had her daughter, has some difficulties with her legs and needed to go on short-term disability. At first she was not diagnosed with MS but after more of the symptoms she underwent MRI testing and had a lumbar puncture by Dr. Johnnye Sima. The imaging and the CSF was consistent with multiple sclerosis. When Betaseron became available she was placed on that. She was on Betaseron for about 10 years. She felt that she did not have too many exacerbations during that time but she had a lot of difficulty tolerating the Betaseron due to skin reactions. Around 12-15 years ago, she started to use a cane and she has had progressive gait disturbance over the last decade. Around the house for short distances, she uses a walker but uses her scooter for longer distances. Outside she uses a wheelchair pushed by others   REVIEW OF SYSTEMS:  Constitutional: No fevers, chills, sweats, or change in appetite.  She has Fatigue Eyes: No visual changes, double vision,  eye pain Ear, nose and throat: No hearing loss, ear pain, nasal congestion, sore throat Cardiovascular: No chest pain, palpitations Respiratory:  No shortness of breath at rest or with exertion.   No wheezes GastrointestinaI: Mild dysphagia.  Constipation.  No nausea, vomiting, diarrhea.   Genitourinary:  No dysuria but has urinary retention and frequency.  Desmopressin helps nocturia. Musculoskeletal:  No neck pain,   Has lower back pain and buttocks.  Hips ans other joints ok Integumentary: No rash, pruritus, skin lesions Neurological: as above Psychiatric: see above Endocrine: No palpitations, diaphoresis, change in appetite, change in weigh or increased thirst Hematologic/Lymphatic:  No anemia, purpura, petechiae. Allergic/Immunologic: No itchy/runny eyes, nasal congestion, recent allergic reactions, rashes  ALLERGIES: Allergies  Allergen Reactions  . Codeine Nausea And Vomiting    HOME MEDICATIONS: Outpatient Medications Prior to Visit  Medication Sig Dispense Refill  . ALPRAZolam (XANAX) 0.5 MG tablet Take 0.5 mg by mouth daily as needed for anxiety.     . cholecalciferol (VITAMIN D) 1000 units tablet Take 1,000 Units by mouth daily.    Marland Kitchen desmopressin (DDAVP) 0.1 MG tablet Take 1 tablet (0.1 mg total) by mouth at bedtime as needed (bladder.). 30 tablet 11  . diazepam (VALIUM) 5 MG tablet TAKE 1 TABLET BY MOUTH EVERY 8 HOURS AS NEEDED 90 tablet 5  . doxepin (SINEQUAN) 10 MG capsule TAKE 1 CAPSULE BY MOUTH EVERY NIGHT AT BEDTIME AS NEEDED 90 capsule 0  . estradiol (CLIMARA - DOSED IN MG/24 HR) 0.075 mg/24hr patch Place 0.075 mg onto the skin once a week. Put on Saturdays.  10  . gabapentin (NEURONTIN) 800 MG tablet Take 1 tablet (800 mg total) by mouth 4 (four) times daily. 120 tablet 11  . ibuprofen (ADVIL,MOTRIN) 200 MG tablet Take 400 mg by mouth every 6 (six) hours as needed for headache (headache).    . lamoTRIgine (LAMICTAL) 200 MG tablet Take 1 tablet (200 mg total) by mouth  2 (two) times daily. 180 tablet 1  . lidocaine (LIDODERM) 5 % Place 1 patch daily onto the skin. Remove & Discard patch within 12 hours or as directed by MD 30 patch 11  . meclizine (ANTIVERT) 25 MG tablet Take 1 tablet (25 mg total) by mouth 3 (three) times daily as needed for dizziness. 30 tablet 0  . meclizine (DRAMAMINE II) 25 MG tablet Take 25 mg by mouth 3 (three) times daily as needed for dizziness.    . ondansetron (ZOFRAN) 8 MG tablet Take by mouth every 8 (eight) hours as needed for nausea or vomiting.    . promethazine (PHENERGAN) 25 MG tablet Take 1 tablet (25 mg total) by mouth every 6 (six) hours as needed for nausea (nausea). 30 tablet 3  . tapentadol (NUCYNTA) 50 MG tablet Take 1 tablet (50 mg total) by mouth 3 (three) times daily as needed for moderate pain or severe pain (pain). 90 tablet 0  . traMADol (ULTRAM) 50 MG tablet TAKE 1 TABLET BY MOUTH THREE TIMES DAILY AS NEEDED FOR MODERATE PAIN 90 tablet 0  . zolpidem (AMBIEN) 10 MG tablet TAKE 1 TABLET BY MOUTH AT BEDTIME AS NEEDED FOR SLEEP 30 tablet 0  . amphetamine-dextroamphetamine (ADDERALL) 20 MG tablet Take 1 tablet (20 mg total) by mouth 2 (two) times daily. 60 tablet 0  . clonazePAM (KLONOPIN) 1 MG tablet TAKE 1 TABLET BY MOUTH EVERY NIGHT AT BEDTIME AS NEEDED 30 tablet 0  . ocrelizumab 600 mg in sodium chloride 0.9 % 500 mL Inject 600 mg into the vein every 6 (six) months.     No facility-administered medications prior to visit.     PAST MEDICAL HISTORY: Past Medical History:  Diagnosis Date  . Anxiety   . Depression   . Gait instability    uses wheelchair or  assistance  . H/O steroid therapy    IV infusion every 6 to 8 weeks- last 05-31-14 [facility]  . History of breast biopsy   . Multiple sclerosis ([place])   . Neuromuscular disorder ([place])    MS  . PONV (postoperative nausea and vomiting)   . Skin cancer     PAST SURGICAL HISTORY: Past Surgical History:  Procedure Laterality Date  . ABDOMINAL  HYSTERECTOMY    . BREAST BIOPSY Right 09/20/2012   Procedure: BREAST BIOPSY WITH NEEDLE LOCALIZATION;  Surgeon: Rolm Bookbinder, MD;  Location: [facility];  Service: General;  Laterality: Right;  . BREAST EXCISIONAL BIOPSY Right    benign  . CESAREAN SECTION    . COLONOSCOPY WITH PROPOFOL N/A 07/13/2014   Procedure: COLONOSCOPY WITH PROPOFOL;  Surgeon: Beryle Beams, MD;  Location: [facility];  Service: Endoscopy;  Laterality: N/A;  . DEBRIDEMENT SKIN / SQ / MUSCLE OF ARM Right 07/13/2003   I & D; debridement of tissue within triceps muscle  . LEG SURGERY     sx as a child  . MELANOMA EXCISION    . PARTIAL HYSTERECTOMY    . TONSILLECTOMY     as a child    FAMILY HISTORY: Family History  Problem Relation Age of Onset  . Hyperlipidemia Mother   . Hypertension Mother   . Hypertension Father   . Diabetes type II Father     SOCIAL HISTORY:  Social History   Socioeconomic History  . Marital status: Married    Spouse name: Not on file  . Number of children: Not on file  . Years of education: Not on file  . Highest education level: Not on file  Occupational History  . Not on file  Social Needs  . Financial resource strain: Not on file  . Food insecurity:    Worry: Not on file    Inability: Not on file  . Transportation needs:    Medical: Not on file    Non-medical: Not on file  Tobacco Use  . Smoking status: Never Smoker  . Smokeless tobacco: Never Used  Substance and Sexual Activity  . Alcohol use: Yes    Comment: social  . Drug use: No  . Sexual activity: Not on file  Lifestyle  . Physical activity:    Days per week: Not on file    Minutes per session: Not on file  . Stress: Not on file  Relationships  . Social connections:    Talks on phone: Not on file    Gets together: Not on file    Attends religious service: Not on file    Active member of club or organization: Not on file    Attends meetings of clubs or organizations: Not on file     Relationship status: Not on file  . Intimate partner violence:    Fear of current or ex partner: Not on file    Emotionally abused: Not on file    Physically abused: Not on file    Forced sexual activity: Not on file  Other Topics Concern  . Not on file  Social History Narrative   Lives at home w/ her husband     PHYSICAL EXAM  Vitals:   01/03/18 1307  BP: 117/79  Pulse: 73  Resp: 16  Weight: 136 lb (61.7 kg)  Height: 5\' 1"  (1.549 m)    Body mass index is 25.7 kg/m.   General: The patient is well-developed and well-nourished and in  no acute distress   Skin/Ext/Musculoskeletal: Mild to moderate pedal edema.   Her feet are erythematous and cold.  Pulses are felt.    She has moderate tenderness over the right piriformis muscle.   There is also moderate tenderness over the right trochanteric bursa.  Neurologic Exam  Mental status: The patient is alert and oriented x 3 at the time of the examination. The patient has apparent normal recent and remote memory, with an apparently normal attention span and concentration ability.   Speech is normal.  Cranial nerves: Extraocular movements are full. Facial strength and sensation is normal. Trapezius strength is normal.. No dysarthria is noted.  No obvious hearing deficits are noted.  Motor:  Muscle bulk and tone are normal. Strength is  3/ 5 in proximal legs and 4- to 4/5 distally.  Left and right arm are strpng.    She uses arms to get out of chair  Sensory: Sensory testing is intact to soft touch, vibration sensation in arms but mild decreased touch in left leg.   .  Coordination: Cerebellar testing reveals reduced finger-nose-finger and poor heel-to-shin bilaterally.  Gait and station: She needs to use arms to stand.   Her station is unsteady and she cannot support her weight for more than a few seconds without bilateral support. She needs bilateral support to stand and take a few steps  Reflexes: Deep tendon reflexes are  symmetric and increased in legs bilaterally with spread at the knees.  She has non-sustained ankle clonus bilaterally.     DIAGNOSTIC DATA (LABS, IMAGING, TESTING) - I reviewed patient records, labs, notes, testing and imaging myself where available.  Lab Results  Component Value Date   WBC 12.9 (H) 01/23/2017   HGB 13.3 01/23/2017   HCT 40.1 01/23/2017   MCV 94.1 01/23/2017   PLT 252 01/23/2017      Component Value Date/Time   NA 139 01/23/2017 1531   K 3.4 (L) 01/23/2017 1531   CL 99 (L) 01/23/2017 1531   CO2 29 01/23/2017 1531   GLUCOSE 119 (H) 01/23/2017 1531   BUN 15 01/23/2017 1531   CREATININE 0.58 01/23/2017 1531   CALCIUM 9.5 01/23/2017 1531   PROT 6.6 10/01/2015 1351   ALBUMIN 4.5 10/01/2015 1351   AST 11 10/01/2015 1351   ALT 11 10/01/2015 1351   ALKPHOS 62 10/01/2015 1351   BILITOT 0.4 10/01/2015 1351   GFRNONAA >60 01/23/2017 1531   GFRAA >60 01/23/2017 1531      ASSESSMENT AND PLAN  Multiple sclerosis (HCC)  Right sided sciatica  Left leg weakness  Trochanteric bursitis of right hip  Ataxic gait  Chronic fatigue - Plan: ANA w/Reflex, Pan-ANCA, Cryoglobulin, Sedimentation rate  Chronic insomnia  Depression with anxiety  Dysesthesia  Pedal edema  Other skin changes - Plan: ANA w/Reflex, Pan-ANCA, Cryoglobulin, Sedimentation rate   1.   She will remain off of a disease modifying therapy.  We discussed starting Mayzent or another agent if exacerbation occurs. 2.   Trigger point injection of the right piriformis muscle with 40 mg DepoMedrol in [place] using sterile technique. She tolerated the procedure well. There were no complications. 3.   Injection aof the right trochanteric bursa with 40 mg DepoMedrol in [place] using sterile technique. She tolerated the procedure well. There were no complications. 4.   Check vasculitis labs to make sure that there is not another explanation for her feet.   5.    Continue medications.     Add  oxcarbazepine  and consider stopping lamotrigine if she gets a benefit.   6.   She will return to see me in 4 months or sooner if she has new or worsening neurologic symptoms.   Richard A. Felecia Shelling, MD, PhD 10/03/1593, 3:96 PM Certified in Neurology, Clinical Neurophysiology, Sleep Medicine, Pain Medicine and Neuroimaging  [facility] [address], [place], [place] [address] [phone] /

## 2018-01-06 DIAGNOSIS — F411 Generalized anxiety disorder: Secondary | ICD-10-CM | POA: Diagnosis not present

## 2018-01-06 DIAGNOSIS — G35 Multiple sclerosis: Secondary | ICD-10-CM | POA: Diagnosis not present

## 2018-01-06 DIAGNOSIS — E7800 Pure hypercholesterolemia, unspecified: Secondary | ICD-10-CM | POA: Diagnosis not present

## 2018-01-07 ENCOUNTER — Telehealth: Payer: Self-pay | Admitting: *Deleted

## 2018-01-07 LAB — PAN-ANCA
ANCA Proteinase 3: <3.5 U/mL (ref 0.0–3.5)
Atypical pANCA: <1:20 {titer}
C-ANCA: <1:20 {titer}
P-ANCA: <1:20 {titer}

## 2018-01-07 LAB — SEDIMENTATION RATE: SED RATE: 4 mm/h (ref 0–40)

## 2018-01-07 LAB — ANA W/REFLEX: ANA: NEGATIVE

## 2018-01-07 LAB — CRYOGLOBULIN

## 2018-01-07 NOTE — Telephone Encounter (Signed)
-----   Message from Britt Bottom, MD sent at 01/07/2018 12:44 PM EDT ----- Please let the patient know that the lab work is fine.

## 2018-01-07 NOTE — Telephone Encounter (Signed)
Spoke to patient - she is aware of lab results. 

## 2018-01-25 ENCOUNTER — Other Ambulatory Visit: Payer: Self-pay | Admitting: Neurology

## 2018-01-25 NOTE — Telephone Encounter (Signed)
Rx registry checked. Last fill date is 12/24/17 for #90. Next OV is 03/03/18.

## 2018-01-26 DIAGNOSIS — G35 Multiple sclerosis: Secondary | ICD-10-CM | POA: Diagnosis not present

## 2018-01-27 DIAGNOSIS — G35 Multiple sclerosis: Secondary | ICD-10-CM | POA: Diagnosis not present

## 2018-01-28 ENCOUNTER — Other Ambulatory Visit: Payer: Self-pay | Admitting: Neurology

## 2018-02-01 ENCOUNTER — Telehealth: Payer: Self-pay | Admitting: Neurology

## 2018-02-01 NOTE — Telephone Encounter (Signed)
Pt called stating she will be taking her last tablet for zolpidem (AMBIEN) 10 MG tablet tonight. Needing the fill date changed to tomorrow 8/7.

## 2018-02-01 NOTE — Telephone Encounter (Signed)
Spoke with [facility] and gave verbal ok to fill Ambien rx. on 8/7.  Spoke with Talynn and let her know/fim

## 2018-02-02 ENCOUNTER — Other Ambulatory Visit: Payer: Self-pay | Admitting: Neurology

## 2018-02-22 ENCOUNTER — Other Ambulatory Visit: Payer: Self-pay | Admitting: Neurology

## 2018-02-23 ENCOUNTER — Other Ambulatory Visit: Payer: Self-pay | Admitting: Neurology

## 2018-02-25 ENCOUNTER — Telehealth: Payer: Self-pay | Admitting: Neurology

## 2018-02-25 MED ORDER — LAMOTRIGINE 200 MG PO TABS: 200.0000 mg | ORAL_TABLET | Freq: Three times a day (TID) | ORAL | 1 refills | Status: DC

## 2018-02-25 NOTE — Telephone Encounter (Signed)
Pt called stating she feels like her skin is on fire, pt unsure if this is MS related or just an allergic reaction to something. Requesting a call to discuss in further detail

## 2018-02-25 NOTE — Telephone Encounter (Signed)
Spoke with Tonya Powers.  She c/o continued hot sensation to skin no sweating, mostly on whichever side she is lying on at night.  Brief episodes that last about 10 min. each. She is currently taking Gabapentin 800mg  QID and Lamotrigine 200mg  BID.  Lyrica and Oxcarbazepine didn't help. Per v/o RAS, she can increase Lamotrigine to 200mg  TID.  She is agreeable with this plan, and if this doesn't help, is going to f/u with pcp or ob/gyn to have hormones checked. New rx. escribed to pharmacy/fim

## 2018-03-01 DIAGNOSIS — M545 Low back pain: Secondary | ICD-10-CM | POA: Diagnosis not present

## 2018-03-01 DIAGNOSIS — R8290 Unspecified abnormal findings in urine: Secondary | ICD-10-CM | POA: Diagnosis not present

## 2018-03-03 ENCOUNTER — Telehealth: Payer: Self-pay | Admitting: Neurology

## 2018-03-03 ENCOUNTER — Other Ambulatory Visit: Payer: Self-pay | Admitting: Neurology

## 2018-03-03 ENCOUNTER — Ambulatory Visit: Payer: Medicare Other | Admitting: Neurology

## 2018-03-03 MED ORDER — MECLIZINE HCL 25 MG PO TABS: 25.0000 mg | ORAL_TABLET | Freq: Three times a day (TID) | ORAL | 0 refills | Status: DC | PRN

## 2018-03-03 MED ORDER — PROMETHAZINE HCL 25 MG PO TABS: 25.0000 mg | ORAL_TABLET | Freq: Four times a day (QID) | ORAL | 3 refills | Status: DC | PRN

## 2018-03-03 NOTE — Addendum Note (Signed)
Addended by: France Ravens I on: 03/03/2018 02:55 PM   Modules accepted: Orders

## 2018-03-03 NOTE — Telephone Encounter (Signed)
Pt sister [name](on DPR) has called stating pt is experiencing very bad vertigo and she'd like RN Faith to call her back to discuss options

## 2018-03-03 NOTE — Telephone Encounter (Signed)
Spoke with [name]. Pt. is not able to come in--vertigo is severe today and she can't move without n/v. This is not a new problem, and there are no c/o new sx. Pt. requesting r/f of meclezine and phenergan. Appt. r/s fpor 9/12/ and rx's sent to [facility]/fim

## 2018-03-04 ENCOUNTER — Encounter: Payer: Self-pay | Admitting: Neurology

## 2018-03-07 ENCOUNTER — Other Ambulatory Visit: Payer: Self-pay

## 2018-03-07 ENCOUNTER — Encounter (HOSPITAL_COMMUNITY): Payer: Self-pay

## 2018-03-07 ENCOUNTER — Emergency Department (HOSPITAL_COMMUNITY): Payer: Medicare Other

## 2018-03-07 ENCOUNTER — Emergency Department (HOSPITAL_COMMUNITY)
Admission: EM | Admit: 2018-03-07 | Discharge: 2018-03-07 | Disposition: A | Discharge status: Home / Self Care | Payer: Medicare Other | Attending: Emergency Medicine | Admitting: Emergency Medicine

## 2018-03-07 DIAGNOSIS — R42 Dizziness and giddiness: Secondary | ICD-10-CM

## 2018-03-07 DIAGNOSIS — G35 Multiple sclerosis: Secondary | ICD-10-CM | POA: Insufficient documentation

## 2018-03-07 DIAGNOSIS — Z79899 Other long term (current) drug therapy: Secondary | ICD-10-CM | POA: Insufficient documentation

## 2018-03-07 DIAGNOSIS — R110 Nausea: Secondary | ICD-10-CM | POA: Diagnosis not present

## 2018-03-07 DIAGNOSIS — Z85828 Personal history of other malignant neoplasm of skin: Secondary | ICD-10-CM | POA: Diagnosis not present

## 2018-03-07 LAB — COMPREHENSIVE METABOLIC PANEL
ALBUMIN: 4.6 g/dL (ref 3.5–5.0)
ALK PHOS: 62 U/L (ref 38–126)
ALT: 16 U/L (ref 0–44)
ANION GAP: 9 (ref 5–15)
AST: 21 U/L (ref 15–41)
BILIRUBIN TOTAL: 0.5 mg/dL (ref 0.3–1.2)
BUN: 15 mg/dL (ref 6–20)
CALCIUM: 9.7 mg/dL (ref 8.9–10.3)
CO2: 30 mmol/L (ref 22–32)
Chloride: 101 mmol/L (ref 98–111)
Creatinine, Ser: 0.67 mg/dL (ref 0.44–1.00)
GFR calc Af Amer: >60 mL/min (ref >60)
GFR calc non Af Amer: >60 mL/min (ref >60)
GLUCOSE: 104 mg/dL — AB (ref 70–99)
Potassium: 3.9 mmol/L (ref 3.5–5.1)
SODIUM: 140 mmol/L (ref 135–145)
TOTAL PROTEIN: 7.7 g/dL (ref 6.5–8.1)

## 2018-03-07 LAB — CBC
HEMATOCRIT: 43 % (ref 36.0–46.0)
HEMOGLOBIN: 14 g/dL (ref 12.0–15.0)
MCH: 31.6 pg (ref 26.0–34.0)
MCHC: 32.6 g/dL (ref 30.0–36.0)
MCV: 97.1 fL (ref 78.0–100.0)
Platelets: 236 10*3/uL (ref 150–400)
RBC: 4.43 MIL/uL (ref 3.87–5.11)
RDW: 12.9 % (ref 11.5–15.5)
WBC: 8.9 10*3/uL (ref 4.0–10.5)

## 2018-03-07 LAB — URINALYSIS, ROUTINE W REFLEX MICROSCOPIC
Bilirubin Urine: NEGATIVE
Glucose, UA: NEGATIVE mg/dL
Hgb urine dipstick: NEGATIVE
KETONES UR: 5 mg/dL — AB
LEUKOCYTES UA: NEGATIVE
Nitrite: NEGATIVE
PROTEIN: NEGATIVE mg/dL
Specific Gravity, Urine: 1.016 (ref 1.005–1.030)
pH: 6 (ref 5.0–8.0)

## 2018-03-07 MED ORDER — LORAZEPAM 2 MG/ML IJ SOLN: 1.0000 mg | Freq: Once | INTRAMUSCULAR | Status: DC

## 2018-03-07 MED ORDER — ONDANSETRON HCL 4 MG/2ML IJ SOLN
4.0000 mg | Freq: Once | INTRAMUSCULAR | Status: AC
  Administered 2018-03-07: 4 mg via INTRAVENOUS
  Filled 2018-03-07: qty 2

## 2018-03-07 MED ORDER — SODIUM CHLORIDE 0.9 % IV BOLUS
1000.0000 mL | Freq: Once | INTRAVENOUS | Status: AC
  Administered 2018-03-07: 1000 mL via INTRAVENOUS

## 2018-03-07 MED ORDER — LORAZEPAM 2 MG/ML IJ SOLN
1.0000 mg | Freq: Once | INTRAMUSCULAR | Status: AC
  Administered 2018-03-07: 1 mg via INTRAVENOUS
  Filled 2018-03-07: qty 1

## 2018-03-07 NOTE — ED Provider Notes (Signed)
[facility] DEPT Provider Note   CSN: [id number] Arrival date & time: 03/07/18  0174     History   Chief Complaint Chief Complaint  Patient presents with  . Dizziness  . Nausea    HPI Tonya Powers is a 57 y.o. female.  HPI 57 year old female presents the emergency department complaints of a sensation of room spinning with associated nausea.  She has a long-standing history of recurrent vertigo.  This is been unrelieved with meclizine at home.  No new headache.  No unilateral arm or leg weakness.  She has a history of multiple sclerosis and reports progressive weakness from her multiple sclerosis.  She is wheelchair-bound at this time.  She denies change in her vision.  No recent fever or chills.  No recent head trauma.  Symptoms are moderate in severity   Past Medical History:  Diagnosis Date  . Anxiety   . Depression   . Gait instability    uses wheelchair or assistance  . H/O steroid therapy    IV infusion every 6 to 8 weeks- last 05-31-14 [facility]  . History of breast biopsy   . Multiple sclerosis ([place])   . Neuromuscular disorder ([place])    MS  . PONV (postoperative nausea and vomiting)   . Skin cancer     Patient Active Problem List   Diagnosis Date Noted  . Vertigo 02/02/2017  . Left leg weakness 02/02/2017  . Right sided sciatica 11/19/2016  . Trochanteric bursitis of right hip 11/19/2016  . Benign paroxysmal positional vertigo 03/24/2016  . Pedal edema 01/30/2016  . Dysesthesia 01/30/2016  . Gastroenteritis 08/16/2014  . Nausea vomiting and diarrhea 08/16/2014  . Hypokalemia 08/16/2014  . Multiple sclerosis ([place]) 07/24/2014  . Ataxic gait 07/24/2014  . Chronic fatigue 07/24/2014  . Urinary dysfunction 07/24/2014  . Chronic insomnia 07/24/2014  . Depression with anxiety 07/24/2014    Past Surgical History:  Procedure Laterality Date  . ABDOMINAL HYSTERECTOMY    . BREAST BIOPSY Right 09/20/2012   Procedure:  BREAST BIOPSY WITH NEEDLE LOCALIZATION;  Surgeon: Rolm Bookbinder, MD;  Location: [facility];  Service: General;  Laterality: Right;  . BREAST EXCISIONAL BIOPSY Right    benign  . CESAREAN SECTION    . COLONOSCOPY WITH PROPOFOL N/A 07/13/2014   Procedure: COLONOSCOPY WITH PROPOFOL;  Surgeon: Beryle Beams, MD;  Location: [facility];  Service: Endoscopy;  Laterality: N/A;  . DEBRIDEMENT SKIN / SQ / MUSCLE OF ARM Right 07/13/2003   I & D; debridement of tissue within triceps muscle  . LEG SURGERY     sx as a child  . MELANOMA EXCISION    . PARTIAL HYSTERECTOMY    . TONSILLECTOMY     as a child     OB History   None      Home Medications    Prior to Admission medications   Medication Sig Start Date End Date Taking? Authorizing Provider  acetaminophen (TYLENOL) 500 MG tablet Take 1,000 mg by mouth daily as needed for pain.   Yes [provider]  ALPRAZolam Duanne Moron) 0.5 MG tablet Take 0.5 mg by mouth daily as needed for anxiety.  06/24/12  Yes [provider]  amphetamine-dextroamphetamine (ADDERALL) 20 MG tablet Take 1 tablet (20 mg total) by mouth 2 (two) times daily. 01/03/18  Yes Sater, Nanine Means, MD  Cholecalciferol (VITAMIN D PO) Take 1 tablet by mouth daily.   Yes [provider]  ciprofloxacin (CIPRO) 500  MG tablet Take 500 mg by mouth 2 (two) times daily. 03/02/18  Yes [provider]  clonazePAM (KLONOPIN) 1 MG tablet Take 1 tablet (1 mg total) by mouth at bedtime as needed. Patient taking differently: Take 1 mg by mouth at bedtime as needed (sleep).  01/03/18  Yes Sater, Nanine Means, MD  desmopressin (DDAVP) 0.1 MG tablet Take 1 tablet (0.1 mg total) by mouth at bedtime as needed (bladder.). 01/30/16  Yes Sater, Nanine Means, MD  diazepam (VALIUM) 5 MG tablet TAKE 1 TABLET BY MOUTH EVERY 8 HOURS AS NEEDED Patient taking differently: Take 5 mg by mouth every 8 (eight) hours as needed for anxiety.  06/09/17  Yes Sater, Nanine Means, MD    doxepin (SINEQUAN) 10 MG capsule TAKE 1 CAPSULE BY MOUTH EVERY NIGHT AT BEDTIME AS NEEDED Patient taking differently: Take 10 mg by mouth at bedtime. TAKE 1 CAPSULE BY MOUTH EVERY NIGHT AT BEDTIME AS NEEDED for sleep 12/06/17  Yes Sater, Nanine Means, MD  estradiol (CLIMARA - DOSED IN MG/24 HR) 0.1 mg/24hr patch Place 0.1 mg onto the skin once a week. 02/03/18  Yes [provider]  gabapentin (NEURONTIN) 800 MG tablet TAKE 1 TABLET(800 MG) BY MOUTH FOUR TIMES DAILY Patient taking differently: Take 800 mg by mouth 3 (three) times daily.  02/02/18  Yes Sater, Nanine Means, MD  ibuprofen (ADVIL,MOTRIN) 200 MG tablet Take 400 mg by mouth every 6 (six) hours as needed for headache (headache).   Yes [provider]  meclizine (ANTIVERT) 25 MG tablet Take 1 tablet (25 mg total) by mouth 3 (three) times daily as needed for dizziness. 03/03/18  Yes Sater, Nanine Means, MD  ocrelizumab 600 mg in sodium chloride 0.9 % 500 mL Inject 600 mg into the vein every 6 (six) months.   Yes [provider]  promethazine (PHENERGAN) 25 MG tablet Take 1 tablet (25 mg total) by mouth every 6 (six) hours as needed for nausea (nausea). 03/03/18  Yes Sater, Nanine Means, MD  tapentadol (NUCYNTA) 50 MG tablet Take 1 tablet (50 mg total) by mouth 3 (three) times daily as needed for moderate pain or severe pain (pain). 03/24/17  Yes Sater, Nanine Means, MD  traMADol (ULTRAM) 50 MG tablet TAKE 1 TABLET BY MOUTH THREE TIMES DAILY AS NEEDED FOR MODERATE PAIN 02/23/18  Yes Sater, Nanine Means, MD  zolpidem (AMBIEN) 10 MG tablet TAKE 1 TABLET BY MOUTH AT BEDTIME AS NEEDED FOR SLEEP 03/03/18  Yes Sater, Nanine Means, MD  lamoTRIgine (LAMICTAL) 200 MG tablet Take 1 tablet (200 mg total) by mouth 3 (three) times daily. Patient not taking: Reported on 03/07/2018 02/25/18   Sater, Nanine Means, MD  lidocaine (LIDODERM) 5 % Place 1 patch daily onto the skin. Remove & Discard patch within 12 hours or as directed by MD Patient not taking: Reported on  03/07/2018 05/06/17   Sater, Nanine Means, MD  Oxcarbazepine (TRILEPTAL) 300 MG tablet One po qAM and two po qHS Patient not taking: Reported on 03/07/2018 01/03/18   Britt Bottom, MD    Family History Family History  Problem Relation Age of Onset  . Hyperlipidemia Mother   . Hypertension Mother   . Hypertension Father   . Diabetes type II Father     Social History Social History   Tobacco Use  . Smoking status: Never Smoker  . Smokeless tobacco: Never Used  Substance Use Topics  . Alcohol use: Not Currently    Comment: social  . Drug use:  No     Allergies   Codeine   Review of Systems Review of Systems  All other systems reviewed and are negative.    Physical Exam Updated Vital Signs BP 100/65   Pulse 75   Temp 98.1 F (36.7 C) (Oral)   Resp 11   Ht 5\' 5"  (1.651 m)   Wt 61.2 kg   SpO2 97%   BMI 22.47 kg/m   Physical Exam  Constitutional: She is oriented to person, place, and time. She appears well-developed and well-nourished. No distress.  HENT:  Head: Normocephalic and atraumatic.  Eyes: Pupils are equal, round, and reactive to light. EOM are normal.  Neck: Normal range of motion.  Cardiovascular: Normal rate, regular rhythm and normal heart sounds.  Pulmonary/Chest: Effort normal and breath sounds normal.  Abdominal: Soft. She exhibits no distension. There is no tenderness.  Musculoskeletal: Normal range of motion.  Neurological: She is alert and oriented to person, place, and time.  5/5 strength in major muscle groups of  bilateral upper and lower extremities. Speech normal. No facial asymetry.   Skin: Skin is warm and dry.  Psychiatric: She has a normal mood and affect. Judgment normal.  Nursing note and vitals reviewed.    ED Treatments / Results  Labs (all labs ordered are listed, but only abnormal results are displayed) Labs Reviewed  COMPREHENSIVE METABOLIC PANEL - Abnormal; Notable for the following components:      Result Value    Glucose, Bld 104 (*)    All other components within normal limits  URINALYSIS, ROUTINE W REFLEX MICROSCOPIC - Abnormal; Notable for the following components:   Ketones, ur 5 (*)    All other components within normal limits  CBC    EKG None  Radiology Ct Head Wo Contrast  Result Date: 03/07/2018 CLINICAL DATA:  Vertigo. EXAM: CT HEAD WITHOUT CONTRAST TECHNIQUE: Contiguous axial images were obtained from the base of the skull through the vertex without intravenous contrast. COMPARISON:  MRI February 28, 2016. FINDINGS: Brain: No evidence of acute infarction, hemorrhage, hydrocephalus, extra-axial collection or mass lesion/mass effect. Vascular: No hyperdense vessel or unexpected calcification. Skull: Normal. Negative for fracture or focal lesion. Sinuses/Orbits: No acute finding. Other: None. IMPRESSION: Normal head CT. Electronically Signed   By: Marijo Conception, M.D.   On: 03/07/2018 12:17    Procedures Procedures (including critical care time)  Medications Ordered in ED Medications  LORazepam (ATIVAN) injection 1 mg (1 mg Intravenous Given 03/07/18 1136)  sodium chloride 0.9 % bolus 1,000 mL (0 mLs Intravenous Stopped 03/07/18 1238)  ondansetron (ZOFRAN) injection 4 mg (4 mg Intravenous Given 03/07/18 1134)     Initial Impression / Assessment and Plan / ED Course  I have reviewed the triage vital signs and the nursing notes.  Pertinent labs & imaging results that were available during my care of the patient were reviewed by me and considered in my medical decision making (see chart for details).     3:42 PM Patient feels much better at this time.  Suspect peripheral vertigo.  CT imaging without acute intra-cranial pathology.  Doubt central vertigo.  No indication for MRI at this time.  Close outpatient follow-up with her primary care physician and her neurologist.  Home health resources given in consultation with case management team.  She will need additional resources at home as it  sounds as though she is becoming progressively weak and is becoming more difficult to manage at home  Final Clinical Impressions(s) / ED  Diagnoses   Final diagnoses:  Vertigo    ED Discharge Orders         [place]     03/07/18 1541    Face-to-face encounter (required for Medicare/Medicaid patients)    Comments:  [facility] certify that this patient is under my care and that I, or a nurse practitioner or physician's assistant working with me, had a face-to-face encounter that meets the physician face-to-face encounter requirements with this patient on 03/07/2018. The encounter with the patient was in whole, or in part for the following medical condition(s) which is the primary reason for home health care (List medical condition): Multiple sclerosis and increasing weakness   03/07/18 1541           Jola Schmidt, MD 03/07/18 1543

## 2018-03-07 NOTE — Telephone Encounter (Signed)
Pt sister [name](on DPR) has called back to inform that pt's vertigo has worsen and she'd like to know if something stronger could be called in for pt.  [name] states she thinks about 2 years ago RN Faith gave pt an injection of some kind.  Please call

## 2018-03-07 NOTE — ED Triage Notes (Signed)
Patient c/o vertigo, blurred vision, and nausea since 02/23/2018. Patient has a history of vertigo and MS.

## 2018-03-07 NOTE — Telephone Encounter (Signed)
I called the patient, talk with the sister.  The patient apparently had severe ongoing nausea and vomiting over the last 10 days or so, the patient eventually went to the emergency room today, they gave her some medication and she is feeling better now.  Apparently they will be seeing Dr. Felecia Shelling later in the week.

## 2018-03-07 NOTE — ED Notes (Signed)
ED Provider at bedside. 

## 2018-03-07 NOTE — ED Notes (Addendum)
Patient transported to CT Pt given pillow

## 2018-03-07 NOTE — Telephone Encounter (Signed)
Patients sister called back and stated that someone from our office called but they were disconnected. She would like a return call her phone number is [phone].

## 2018-03-07 NOTE — ED Notes (Signed)
Family wanting to leave. States that she was told that Case management will call them.

## 2018-03-07 NOTE — ED Notes (Signed)
Pt placed on [place].

## 2018-03-07 NOTE — Care Management Note (Signed)
Case Management Note  Patient Details  Name: Tonya Powers MRN: [id number] Date of Birth: 05-Dec-1960  Subjective/Objective:                  vertigo  Action/Plan: ED CM attempted to reach the patient. Per the nurse, the patient has left the ED with her sister. She "did not want to wait" to speak with a CM. Per the nurse, the patient's sister stated the CM can call her. CM will attempt to reach the patient to obtain permission to speak with her sister to arrange home health.   [place] - ED CM called the patient. Message left on her voicemail requesting a return call. CM contact information provided.  1730 - ED CM called the patient's sister, Tonya Powers. She states she is with the patient. ED CM spoke with the patient and was give permission to speak with her sister, Tonya Powers. [name] states the patient lives at home with her husband. The patient's husband is not at home most of the time. [name] lives close to the patient. She states some days the patient is able to get onto her scooter and drive her car and some days she is not able to move. She has scooter, wheelchair and other DME. CM provided the names of  home health providers. [name] and the patient selected [facility] [place] services. Home Health orders faxed to [facility]. [name] was provided with the telephone number for [facility].     Expected Discharge Date:    03/07/18             Expected Discharge Plan:     In-House Referral:     Discharge planning Services     Post Acute Care Choice:    Choice offered to:     DME Arranged:   N/A DME Agency:   N/A  HH Arranged:   [place] Agency:   PT, OT, RN, [place] and CSW  Status of Service:     If discussed at [place] of Stay Meetings, dates discussed:    Additional Comments:  Apolonio Schneiders, RN 03/07/2018, 4:48 PM

## 2018-03-07 NOTE — ED Notes (Signed)
Case management called back after patient left the department.  Made CM aware that pt's sister Carmel [place] is wanting to be called by them instead of the patient.   Case management will be contacting pt and family at home.

## 2018-03-10 ENCOUNTER — Encounter: Payer: Self-pay | Admitting: Neurology

## 2018-03-10 ENCOUNTER — Ambulatory Visit (INDEPENDENT_AMBULATORY_CARE_PROVIDER_SITE_OTHER): Payer: Medicare Other | Admitting: Neurology

## 2018-03-10 VITALS — BP 128/78 | HR 85 | Resp 20 | Ht 65.0 in | Wt 130.0 lb

## 2018-03-10 DIAGNOSIS — H8111 Benign paroxysmal vertigo, right ear: Secondary | ICD-10-CM | POA: Diagnosis not present

## 2018-03-10 DIAGNOSIS — H8112 Benign paroxysmal vertigo, left ear: Secondary | ICD-10-CM | POA: Diagnosis not present

## 2018-03-10 DIAGNOSIS — M7061 Trochanteric bursitis, right hip: Secondary | ICD-10-CM

## 2018-03-10 DIAGNOSIS — M5431 Sciatica, right side: Secondary | ICD-10-CM

## 2018-03-10 DIAGNOSIS — R260 Ataxic gait: Secondary | ICD-10-CM

## 2018-03-10 DIAGNOSIS — G35 Multiple sclerosis: Secondary | ICD-10-CM | POA: Diagnosis not present

## 2018-03-10 DIAGNOSIS — R8290 Unspecified abnormal findings in urine: Secondary | ICD-10-CM

## 2018-03-10 DIAGNOSIS — R600 Localized edema: Secondary | ICD-10-CM | POA: Diagnosis not present

## 2018-03-10 DIAGNOSIS — R5382 Chronic fatigue, unspecified: Secondary | ICD-10-CM

## 2018-03-10 DIAGNOSIS — R42 Dizziness and giddiness: Secondary | ICD-10-CM

## 2018-03-10 DIAGNOSIS — R300 Dysuria: Secondary | ICD-10-CM

## 2018-03-10 MED ORDER — AMPHETAMINE-DEXTROAMPHETAMINE 20 MG PO TABS: 20.0000 mg | ORAL_TABLET | Freq: Two times a day (BID) | ORAL | 0 refills | Status: DC

## 2018-03-10 MED ORDER — ONDANSETRON HCL 4 MG PO TABS: 4.0000 mg | ORAL_TABLET | Freq: Three times a day (TID) | ORAL | 3 refills | Status: DC | PRN

## 2018-03-10 NOTE — Progress Notes (Signed)
[facility]  PATIENT: Tonya Powers DOB: 15-Sep-1960  REFERRING CLINICIAN: Mayra Neer is PCP HISTORY FROM: Patient  REASON FOR VISIT: MS and poor gait   HISTORICAL  CHIEF COMPLAINT:  Chief Complaint  Patient presents with  . Multiple Sclerosis    Not currently on a dmt. Vertigo has been worse since 02/23/18.  Meclizine not helping. Seen at [facility] 03/07/18 and received IV Ativan which helped for about 2 hours, then vertigo returned.  Sts. still having burning in right flank, right let.  Would like nerve block to help with this.  Oxcarbazepine did not help.  She is currently taking Lamictal 200mg  bid, but is going to increase to tid as previously discussed/fim    HISTORY OF PRESENT ILLNESS:  Tonya Powers is a 57 y.o. woman with MS.      Update 03/10/2018: For the past 2 weeks, she has had more vertigo.  The onset was over a day.  Meclizine has not helped.  The sister did an Epley maneuver or other vestibular exercise and noted that there was nystagmus.  Over the past week the vertigo persisted and she went to [facility] 03/07/2018 and received IV Ativan with benefit that day.  Zofran greatly helped the nausea.  Initially, she had significant nausea and also some vomiting.  She continues to experience a lot of pain in the right hip and buttock region.  In the past, piriformis and/or trochanteric bursa injections have helped her for many weeks.  Lamotrigine has helped this pain some but oxcarbazepine did not.  She is not currently on a disease modifying therapy for her MS.  She did not feel that she got any benefit from [place] and also was progressing on other medications.  Her main problem is gait and she has bilateral leg weakness.  She can only take a few steps with bilateral support.  She mostly uses a scooter.  She has had a fair amount of fatigue and reports attention deficit disorder.  She has had some depression but mood is doing fairly well  currently.  And the   Update 01/03/2018: She is noting more difficulty with her painful dysesthesias.  The wrose pain is in her right hip and left > right  legs but she has skin dysesthestic pain from her chest down (not much in arms).    Her feet get very red with swelling as the day goes on.     She is on lamotrigine 200 mg po bid, gabapentin 800 mg po qid and Nucynta (prn only)    She iuses her scooter mostly and can go 20 feet or so with her walker but not repeatedly as it wears her out.   She has left > right leg weakness.   Arms are strong.    She has insomnia and takes clonazepam and doxepin nightly.   She also has anxiety (not depression) and will occ take a valium if this is more of a problem.      Update 04/29/2017: She felt more weakness and vertigo on Ocrelizumab and she has stopped.   She is not currently on any DMT.   She takes steroids IV every month or two and notes improvement of fatigue and general strength. She has left > right leg weakness and spasticity.   She feels gait is worse and she has more trouble lifting the left leg.   With a walker she can go 10-20 feet and she uses a scooter mostly.  She can transfer though this is sometimes difficult and she has had falls.   Ampyra had not helped.    She has dysesthetic pain helped by tramadol, gabapentin and lamotrigine.   She uses Nucynta sparingly.    She still has bouts of severe vertigo.   She does Epley maneuvers when it reoccurs.     She has low back pain that intensifies woith prolonged siting.   Solanpas lidocaine patches help some.   Piriformis shots have also helped.    She has fatigue daily.  Adderall has greatly helped her fatigue and poor focus.  She has trouble getting comfortable at night according insomnia.  Ambien, clonazepam and doxepin help her sleep but without one of them, sleep is worse.        From 02/02/2017:   Vertigo:    She has had vertigo x 15 days.   She has been on meclizine, phenergan, Medrol  dosepak, clonazepam (not sure if helped but sleeps better)and IV Solu-Medrol.  Dramamine may have helped slightly.   She has vertigo while still and has diplopia.    Initially she also had N/V.  The diplopia is worse with movements.     She went to the ED and had a Morphine shot when pain was worse and also had IV fluids.     MS:   She is back on the Ocrevus.   She thinks her MS is probably stable but is concerned that the vertigo might be a new exacerbation.Marland Kitchen    She was on Tysabri and was stable but converted to JCV Ab positive and stopped.   She could not tolerate Tecfidera.        Gait/strength :    Her gait is very poor and she mostly uses a scooter. She can use a walker for a few steps (probably up to 20 feet).     She has left > right leg weakness and spasticity.       Dysesthesia/hip pain:   She continues to report burning pain in the right leg and right flank lamotrigine has only helps the pain a little bit.    Tegretol and gabapentin have not helped much.   TCA's have not helped.     Baclofen made her feel weaker and she stopped.     Nucynta is too expensive.   Tramadol has not helped much   Bladder/bowel:  She has a difficult bladder dysfunction with frequent incontinence. She has quite a bit of hesitancy, worse when she uses the opiates (now off of these).    Tamsulosin did not really help the hesitancy. Desmopressin helps her to the bed at night to help her more. She still has frequent incontinence.     She reports constipation. Linzess did not help.    Nucynta does not worsen constipation like opiates did but is very expensive.  Fatigue/sleep:   She has fatigue most days, worse with heat. Adderall has helped.   She does well doing pool exercises.,  When fatiue is more severe she takes  Solu-Medrol infusions.   She has both sleep onset and sleep maintenance insomnia.   She takes clonazepam 1 mg and Ambien every night due to difficulty falling and staying asleep. Clonazepam also helps her leg  spasms as well as helps her to fall asleep.   Initially, does note progressive help her sleep but now she is having a lot of incontinence again for   Mood/cognition:   She notes mild depression and anxiety.  She prefers not to take an antidepressant.    She rarely takes Xanax 0.5 mg when she has more anxiety. She notes more trouble with cognition, especially short term memory.    Adderall helps focus some    MS History:  She presented with optic neuritis in 1991 followed shortly by difficulties with leg weakness and by right trigeminal neuralgia. In retrospect, couple years earlier when she had her daughter, has some difficulties with her legs and needed to go on short-term disability. At first she was not diagnosed with MS but after more of the symptoms she underwent MRI testing and had a lumbar puncture by Dr. Johnnye Sima. The imaging and the CSF was consistent with multiple sclerosis. When Betaseron became available she was placed on that. She was on Betaseron for about 10 years. She felt that she did not have too many exacerbations during that time but she had a lot of difficulty tolerating the Betaseron due to skin reactions. Around 12-15 years ago, she started to use a cane and she has had progressive gait disturbance over the last decade. Around the house for short distances, she uses a walker but uses her scooter for longer distances. Outside she uses a wheelchair pushed by others   REVIEW OF SYSTEMS:  Constitutional: No fevers, chills, sweats, or change in appetite.  She has Fatigue Eyes: No visual changes, double vision, eye pain Ear, nose and throat: No hearing loss, ear pain, nasal congestion, sore throat Cardiovascular: No chest pain, palpitations Respiratory:  No shortness of breath at rest or with exertion.   No wheezes GastrointestinaI: Mild dysphagia.  Constipation.  No nausea, vomiting, diarrhea.   Genitourinary:  No dysuria but has urinary retention and frequency.  Desmopressin helps  nocturia. Musculoskeletal:  No neck pain,   Has lower back pain and buttocks.  Hips ans other joints ok Integumentary: No rash, pruritus, skin lesions Neurological: as above Psychiatric: see above Endocrine: No palpitations, diaphoresis, change in appetite, change in weigh or increased thirst Hematologic/Lymphatic:  No anemia, purpura, petechiae. Allergic/Immunologic: No itchy/runny eyes, nasal congestion, recent allergic reactions, rashes  ALLERGIES: Allergies  Allergen Reactions  . Codeine Nausea And Vomiting    HOME MEDICATIONS: Outpatient Medications Prior to Visit  Medication Sig Dispense Refill  . acetaminophen (TYLENOL) 500 MG tablet Take 1,000 mg by mouth daily as needed for pain.    Marland Kitchen ALPRAZolam (XANAX) 0.5 MG tablet Take 0.5 mg by mouth daily as needed for anxiety.     . Cholecalciferol (VITAMIN D PO) Take 1 tablet by mouth daily.    . ciprofloxacin (CIPRO) 500 MG tablet Take 500 mg by mouth 2 (two) times daily.  0  . clonazePAM (KLONOPIN) 1 MG tablet Take 1 tablet (1 mg total) by mouth at bedtime as needed. (Patient taking differently: Take 1 mg by mouth at bedtime as needed (sleep). ) 30 tablet 5  . desmopressin (DDAVP) 0.1 MG tablet Take 1 tablet (0.1 mg total) by mouth at bedtime as needed (bladder.). 30 tablet 11  . diazepam (VALIUM) 5 MG tablet TAKE 1 TABLET BY MOUTH EVERY 8 HOURS AS NEEDED (Patient taking differently: Take 5 mg by mouth every 8 (eight) hours as needed for anxiety. ) 90 tablet 5  . doxepin (SINEQUAN) 10 MG capsule TAKE 1 CAPSULE BY MOUTH EVERY NIGHT AT BEDTIME AS NEEDED (Patient taking differently: Take 10 mg by mouth at bedtime. TAKE 1 CAPSULE BY MOUTH EVERY NIGHT AT BEDTIME AS NEEDED for sleep) 90 capsule 0  . estradiol (  CLIMARA - DOSED IN MG/24 HR) 0.1 mg/24hr patch Place 0.1 mg onto the skin once a week.  5  . gabapentin (NEURONTIN) 800 MG tablet TAKE 1 TABLET(800 MG) BY MOUTH FOUR TIMES DAILY (Patient taking differently: Take 800 mg by mouth 3 (three)  times daily. ) 120 tablet 11  . ibuprofen (ADVIL,MOTRIN) 200 MG tablet Take 400 mg by mouth every 6 (six) hours as needed for headache (headache).    . lamoTRIgine (LAMICTAL) 200 MG tablet Take 1 tablet (200 mg total) by mouth 3 (three) times daily. 270 tablet 1  . lidocaine (LIDODERM) 5 % Place 1 patch daily onto the skin. Remove & Discard patch within 12 hours or as directed by MD 30 patch 11  . meclizine (ANTIVERT) 25 MG tablet Take 1 tablet (25 mg total) by mouth 3 (three) times daily as needed for dizziness. 30 tablet 0  . promethazine (PHENERGAN) 25 MG tablet Take 1 tablet (25 mg total) by mouth every 6 (six) hours as needed for nausea (nausea). 30 tablet 3  . tapentadol (NUCYNTA) 50 MG tablet Take 1 tablet (50 mg total) by mouth 3 (three) times daily as needed for moderate pain or severe pain (pain). 90 tablet 0  . traMADol (ULTRAM) 50 MG tablet TAKE 1 TABLET BY MOUTH THREE TIMES DAILY AS NEEDED FOR MODERATE PAIN 90 tablet 0  . zolpidem (AMBIEN) 10 MG tablet TAKE 1 TABLET BY MOUTH AT BEDTIME AS NEEDED FOR SLEEP 30 tablet 0  . amphetamine-dextroamphetamine (ADDERALL) 20 MG tablet Take 1 tablet (20 mg total) by mouth 2 (two) times daily. 60 tablet 0  . ocrelizumab 600 mg in sodium chloride 0.9 % 500 mL Inject 600 mg into the vein every 6 (six) months.    . Oxcarbazepine (TRILEPTAL) 300 MG tablet One po qAM and two po qHS (Patient not taking: Reported on 03/07/2018) 90 tablet 11   No facility-administered medications prior to visit.     PAST MEDICAL HISTORY: Past Medical History:  Diagnosis Date  . Anxiety   . Depression   . Gait instability    uses wheelchair or assistance  . H/O steroid therapy    IV infusion every 6 to 8 weeks- last 05-31-14 [facility]  . History of breast biopsy   . Multiple sclerosis ([place])   . Neuromuscular disorder ([place])    MS  . PONV (postoperative nausea and vomiting)   . Skin cancer     PAST SURGICAL HISTORY: Past Surgical History:  Procedure  Laterality Date  . ABDOMINAL HYSTERECTOMY    . BREAST BIOPSY Right 09/20/2012   Procedure: BREAST BIOPSY WITH NEEDLE LOCALIZATION;  Surgeon: Rolm Bookbinder, MD;  Location: [facility];  Service: General;  Laterality: Right;  . BREAST EXCISIONAL BIOPSY Right    benign  . CESAREAN SECTION    . COLONOSCOPY WITH PROPOFOL N/A 07/13/2014   Procedure: COLONOSCOPY WITH PROPOFOL;  Surgeon: Beryle Beams, MD;  Location: [facility];  Service: Endoscopy;  Laterality: N/A;  . DEBRIDEMENT SKIN / SQ / MUSCLE OF ARM Right 07/13/2003   I & D; debridement of tissue within triceps muscle  . LEG SURGERY     sx as a child  . MELANOMA EXCISION    . PARTIAL HYSTERECTOMY    . TONSILLECTOMY     as a child    FAMILY HISTORY: Family History  Problem Relation Age of Onset  . Hyperlipidemia Mother   . Hypertension Mother   . Hypertension Father   .  Diabetes type II Father     SOCIAL HISTORY:  Social History   Socioeconomic History  . Marital status: Married    Spouse name: Not on file  . Number of children: Not on file  . Years of education: Not on file  . Highest education level: Not on file  Occupational History  . Not on file  Social Needs  . Financial resource strain: Not on file  . Food insecurity:    Worry: Not on file    Inability: Not on file  . Transportation needs:    Medical: Not on file    Non-medical: Not on file  Tobacco Use  . Smoking status: Never Smoker  . Smokeless tobacco: Never Used  Substance and Sexual Activity  . Alcohol use: Not Currently    Comment: social  . Drug use: No  . Sexual activity: Not on file  Lifestyle  . Physical activity:    Days per week: Not on file    Minutes per session: Not on file  . Stress: Not on file  Relationships  . Social connections:    Talks on phone: Not on file    Gets together: Not on file    Attends religious service: Not on file    Active member of club or organization: Not on file    Attends meetings of  clubs or organizations: Not on file    Relationship status: Not on file  . Intimate partner violence:    Fear of current or ex partner: Not on file    Emotionally abused: Not on file    Physically abused: Not on file    Forced sexual activity: Not on file  Other Topics Concern  . Not on file  Social History Narrative   Lives at home w/ her husband     PHYSICAL EXAM  Vitals:   03/10/18 1335  BP: 128/78  Pulse: 85  Resp: 20  Weight: 130 lb (59 kg)  Height: 5\' 5"  (1.651 m)    Body mass index is 21.63 kg/m.   General: The patient is well-developed and well-nourished and in no acute distress   Skin/Ext/Musculoskeletal: She has pedal edema.  The feet are erythematous, right greater than left.  Pulses are felt.   She has moderately severe tenderness over the right piriformis muscle.   There is also moderate tenderness over the right trochanteric bursa.  Neurologic Exam  Mental status: The patient is alert and oriented x 3 at the time of the examination. The patient has apparent normal recent and remote memory, with an apparently normal attention span and concentration ability.   Speech is normal.  Cranial nerves: Extraocular movements are full.  Facial strength and sensation was normal.  Trapezius strength is normal.. No dysarthria is noted.  No obvious hearing deficits are noted.  Motor:  Muscle bulk and tone are normal. Strength is  3/ 5 in proximal legs and 4- to 4/5 distally.  The arms are fairly strong.  Sensory: She has intact sensation to touch and vibration in the arms.  There is mildly reduced touch sensation in the left leg..  Coordination: Cerebellar testing reveals reduced finger-nose-finger and poor heel-to-shin bilaterally.  Gait and station: She is able to stand up on her own stronger using her arms.  She is unable to take steps without bilateral support.  Reflexes: Deep tendon reflexes are symmetric and increased in legs bilaterally with spread at the knees.   She has non-sustained ankle clonus bilaterally.  Maneuvers:  Dix-Hallpike maneuver to the right evoked vertigo without nystagmus.     DIAGNOSTIC DATA (LABS, IMAGING, TESTING) - I reviewed patient records, labs, notes, testing and imaging myself where available.  Lab Results  Component Value Date   WBC 8.9 03/07/2018   HGB 14.0 03/07/2018   HCT 43.0 03/07/2018   MCV 97.1 03/07/2018   PLT 236 03/07/2018      Component Value Date/Time   NA 140 03/07/2018 1138   K 3.9 03/07/2018 1138   CL 101 03/07/2018 1138   CO2 30 03/07/2018 1138   GLUCOSE 104 (H) 03/07/2018 1138   BUN 15 03/07/2018 1138   CREATININE 0.67 03/07/2018 1138   CALCIUM 9.7 03/07/2018 1138   PROT 7.7 03/07/2018 1138   PROT 6.6 10/01/2015 1351   ALBUMIN 4.6 03/07/2018 1138   ALBUMIN 4.5 10/01/2015 1351   AST 21 03/07/2018 1138   ALT 16 03/07/2018 1138   ALKPHOS 62 03/07/2018 1138   BILITOT 0.5 03/07/2018 1138   BILITOT 0.4 10/01/2015 1351   GFRNONAA >60 03/07/2018 1138   GFRAA >60 03/07/2018 1138      ASSESSMENT AND PLAN  Multiple sclerosis (HCC)  Benign paroxysmal positional vertigo of left ear  Right sided sciatica  Trochanteric bursitis of right hip  Ataxic gait  Chronic fatigue  Vertigo  Pedal edema   1.   For now, she would like to remain off of a disease modifying therapy for her MS.  We have discussed [place] as an option if she chooses to go back on a disease modifying therapy.   2.   Trigger point injection of the right piriformis muscle with 40 mg DepoMedrol in [place] using sterile technique. She tolerated the procedure well. There were no complications. 3.   Injection of the right trochanteric bursa (hip) with 40 mg DepoMedrol in [place] using sterile technique. She tolerated the procedure well. There were no complications. 4.   She will continue her medications for pain.  I will renew the Adderall for her fatigue and attention deficit.  I will start her ondansetron to use as  needed for nausea 5.   Epley maneuver (canalolith repositioning). 6.   she will return to see me in 4 months or sooner if she has new or worsening neurologic symptoms.   Ivania Teagarden A. Felecia Shelling, MD, PhD 10/23/8339, 9:62 PM Certified in Neurology, Clinical Neurophysiology, Sleep Medicine, Pain Medicine and Neuroimaging  [facility] [address] [place], [place] [address] [phone] /

## 2018-03-14 ENCOUNTER — Telehealth: Payer: Self-pay | Admitting: Neurology

## 2018-03-14 DIAGNOSIS — R260 Ataxic gait: Secondary | ICD-10-CM

## 2018-03-14 DIAGNOSIS — R42 Dizziness and giddiness: Secondary | ICD-10-CM

## 2018-03-14 DIAGNOSIS — G35 Multiple sclerosis: Secondary | ICD-10-CM

## 2018-03-14 NOTE — Telephone Encounter (Signed)
Spoke with Abigail Butts. She sts. following Epley maneuver, IV SoluMedrol last wk, she had brief improvement of Vertigo, but it returned that evening. She gets brief, minimal relief with Brand-Daroff exercises, but vertigo, nausea returns quickly. She is on Meclizine, Diazpeam, Phenergan and Zofran.  Wants to know if RAS has any other options for her.  I will check with him and call her back; she is aware return call will likely be tomorrow/fim

## 2018-03-14 NOTE — Telephone Encounter (Signed)
Patient saw Dr. Felecia Shelling on 03-10-18 and discussed vertigo,. She has taken  meclizine (ANTIVERT) 25 MG tablet and promethazine (PHENERGAN) 25 MG tablet but does not  help especially when she gets up. Please call and discuss because she does not know what to do. Patient uses [facility] on [address],

## 2018-03-15 ENCOUNTER — Other Ambulatory Visit: Payer: Self-pay | Admitting: Neurology

## 2018-03-15 MED ORDER — DIAZEPAM 5 MG PO TABS: 5.0000 mg | ORAL_TABLET | Freq: Three times a day (TID) | ORAL | 5 refills | Status: DC | PRN

## 2018-03-15 NOTE — Telephone Encounter (Signed)
LMOM for Tonya Powers.  Next option would be vestibular rehab if she would like to try this. I could make an urgent referral to see if they could get her in quickly.  Please call and let me know if she would like a referral to vestibular rehab/fim

## 2018-03-15 NOTE — Telephone Encounter (Signed)
Spoke with Tonya Powers. She sts. she has seen ENT and they were not able to help Vertigo. She does not want a referral for vestibular rehab at this time, but will let us know if she changes her mind.  She is going to continue Valium and Meclizine, Phenergan, Zofran. She needs a r/f of Valium. Dr. Felecia Shelling will send this in./fim

## 2018-03-15 NOTE — Addendum Note (Signed)
Addended by: Arlice Colt A on: 03/15/2018 10:07 PM   Modules accepted: Orders

## 2018-03-15 NOTE — Addendum Note (Signed)
Addended by: France Ravens I on: 03/15/2018 11:44 AM   Modules accepted: Orders

## 2018-03-22 ENCOUNTER — Other Ambulatory Visit: Payer: Self-pay | Admitting: Neurology

## 2018-03-24 ENCOUNTER — Other Ambulatory Visit: Payer: Self-pay | Admitting: Neurology

## 2018-03-28 DIAGNOSIS — G35 Multiple sclerosis: Secondary | ICD-10-CM | POA: Diagnosis not present

## 2018-03-28 NOTE — Telephone Encounter (Signed)
Patient said she will come in for the infusion today and will be her in 20 minutes.

## 2018-03-28 NOTE — Telephone Encounter (Signed)
LMOM (identified vm).  Per RAS, he is not sure SM will help her vertigo, but ok to try.  Otila Kluver can infuse her today if she comes on now./fim

## 2018-03-28 NOTE — Telephone Encounter (Signed)
Patient says vertigo not any better and would like to come in today for a Solumedrol infusion.

## 2018-03-28 NOTE — Telephone Encounter (Signed)
Pt. presented to the infusion suite and received IV SM 1gm today/fim

## 2018-04-01 ENCOUNTER — Other Ambulatory Visit: Payer: Self-pay | Admitting: Neurology

## 2018-04-13 ENCOUNTER — Other Ambulatory Visit: Payer: Self-pay | Admitting: Neurology

## 2018-04-15 DIAGNOSIS — N951 Menopausal and female climacteric states: Secondary | ICD-10-CM | POA: Diagnosis not present

## 2018-04-15 DIAGNOSIS — Z01419 Encounter for gynecological examination (general) (routine) without abnormal findings: Secondary | ICD-10-CM | POA: Diagnosis not present

## 2018-04-22 NOTE — Telephone Encounter (Signed)
Spoke with Tonya Powers. She is experiencing Vertigo again and would like to try vestibular rehap. Order placed in Epic/fim

## 2018-04-22 NOTE — Telephone Encounter (Signed)
Pt requesting a call to discuss starting PT for her vertigo, did not wish to discuss further with me

## 2018-04-22 NOTE — Addendum Note (Signed)
Addended by: France Ravens I on: 04/22/2018 11:49 AM   Modules accepted: Orders

## 2018-04-25 ENCOUNTER — Telehealth: Payer: Self-pay | Admitting: Neurology

## 2018-04-25 NOTE — Telephone Encounter (Signed)
Pts sister Malachy Mood requesting a call stating the pt has had Vertigo attacks all weekend and would like to get her in with rehab as soon as possible

## 2018-04-25 NOTE — Addendum Note (Signed)
Addended by: France Ravens I on: 04/25/2018 08:40 AM   Modules accepted: Orders

## 2018-04-25 NOTE — Telephone Encounter (Signed)
Noted/fim 

## 2018-04-25 NOTE — Telephone Encounter (Signed)
Called Fronia back and gave her the telephone and relayed to call the them the order is there and patient is ready to be scheduled . Thanks Hinton Dyer. [phone]

## 2018-04-25 NOTE — Telephone Encounter (Signed)
Faith can you put in a referral so I can place in there [facility] for next door thanks Burna .

## 2018-04-25 NOTE — Telephone Encounter (Signed)
Error

## 2018-04-26 ENCOUNTER — Ambulatory Visit: Payer: Medicare Other | Admitting: Rehabilitative and Restorative Service Providers"

## 2018-04-29 ENCOUNTER — Other Ambulatory Visit: Payer: Self-pay | Admitting: Neurology

## 2018-04-29 ENCOUNTER — Ambulatory Visit: Payer: Medicare Other | Attending: Family Medicine | Admitting: Rehabilitative and Restorative Service Providers"

## 2018-04-29 ENCOUNTER — Encounter: Payer: Self-pay | Admitting: Rehabilitative and Restorative Service Providers"

## 2018-04-29 DIAGNOSIS — R42 Dizziness and giddiness: Secondary | ICD-10-CM | POA: Insufficient documentation

## 2018-04-29 DIAGNOSIS — R2681 Unsteadiness on feet: Secondary | ICD-10-CM | POA: Diagnosis not present

## 2018-04-29 MED ORDER — AMPHETAMINE-DEXTROAMPHETAMINE 20 MG PO TABS: 20.0000 mg | ORAL_TABLET | Freq: Two times a day (BID) | ORAL | 0 refills | Status: DC

## 2018-04-29 NOTE — Addendum Note (Signed)
Addended by: Noberto Retort C on: 04/29/2018 12:12 PM   Modules accepted: Orders

## 2018-04-29 NOTE — Telephone Encounter (Signed)
Patient called in stating she needs a refill on her Adderal.

## 2018-04-30 ENCOUNTER — Other Ambulatory Visit: Payer: Self-pay

## 2018-04-30 NOTE — Therapy (Signed)
[facility] [address] [place], [place], [address] Phone: [phone]   Fax:  [phone]  Physical Therapy Evaluation  Patient Details  Name: Tonya Powers MRN: [id number] Date of Birth: April 27, 1961 Referring Provider (PT): Arlice Colt, MD   Encounter Date: 04/29/2018  PT End of Session - 04/30/18 1003    Visit Number  1    Number of Visits  5    Date for PT Re-Evaluation  06/14/18    Authorization Type  blue medicare $40 copay    PT Start Time  0938    PT Stop Time  1455    PT Time Calculation (min)  50 min    Activity Tolerance  Other (comment)   rested to allow dizziness to settle.   Behavior During Therapy  [facility] for tasks assessed/performed       Past Medical History:  Diagnosis Date  . Anxiety   . Depression   . Gait instability    uses wheelchair or assistance  . H/O steroid therapy    IV infusion every 6 to 8 weeks- last 05-31-14 [facility]  . History of breast biopsy   . Multiple sclerosis ([place])   . Neuromuscular disorder ([place])    MS  . PONV (postoperative nausea and vomiting)   . Skin cancer     Past Surgical History:  Procedure Laterality Date  . ABDOMINAL HYSTERECTOMY    . BREAST BIOPSY Right 09/20/2012   Procedure: BREAST BIOPSY WITH NEEDLE LOCALIZATION;  Surgeon: Rolm Bookbinder, MD;  Location: [facility];  Service: General;  Laterality: Right;  . BREAST EXCISIONAL BIOPSY Right    benign  . CESAREAN SECTION    . COLONOSCOPY WITH PROPOFOL N/A 07/13/2014   Procedure: COLONOSCOPY WITH PROPOFOL;  Surgeon: Beryle Beams, MD;  Location: [facility];  Service: Endoscopy;  Laterality: N/A;  . DEBRIDEMENT SKIN / SQ / MUSCLE OF ARM Right 07/13/2003   I & D; debridement of tissue within triceps muscle  . LEG SURGERY     sx as a child  . MELANOMA EXCISION    . PARTIAL HYSTERECTOMY    . TONSILLECTOMY     as a child    There were no vitals filed for this  visit.   Subjective Assessment - 04/29/18 1401    Subjective  The patient began with onset of vertigo 01/2016.  She had a "bad bout" of spinning dizziness and notes she gets sick and has to lay in bed with her eyes closed.  Symptoms last 1-2 weeks.  She feels symptoms begin the night before and she can take dramamine and reduce symptoms.  She notes symptoms returned 02/23/18 and have been present since then.    She has more severe episodes reporting, "I can't open my eyes and have to have no light or television".  She notes she can't look at a device.    When the vertigo is present she can have states of confusion and disorientation.  She denies hearing changes with audiologic testing, she does note water swooshing noise in her ear and hearing her heart beat.    Patient is accompained by:  Family member   daughter   Pertinent History  MS, BPPV    Patient Stated Goals  Get rid of the vertigo.      Currently in Pain?  Yes    Effect of Pain on Daily Activities  hypersensitivity to clothing/ sensitive skin; pain in R hip.  Not using  the scooter due to vertigo.           [facility] PT Assessment - 04/29/18 1423      Assessment   Medical Diagnosis  Multiple Sclerosis, vertigo    Referring Provider (PT)  Arlice Colt, MD    Onset Date/Surgical Date  03/10/18    Hand Dominance  Right    Prior Therapy  known to our clinic from prior therapy      Precautions   Precautions  Fall    Precaution Comments  uses scooter      Restrictions   Weight Bearing Restrictions  No      Balance Screen   Has the patient fallen in the past 6 months  Yes    How many times?  regular falls      [place] residence    Living Arrangements  Spouse/significant other    [place] entrance    Horticulturist, commercial scooter;Wheelchair - manual   to discuss other equipment- referral for vertigo today     Prior Function   Level of Independence  Needs assistance with  ADLs;Needs assistance with transfers    Comments  Patient is unable to use her scooter at this time due to vertigo.      Bed Mobility   Bed Mobility  Rolling Right;Rolling Left;Supine to Sit;Sit to Supine    Rolling Right  Minimal Assistance - Patient > 75%    Rolling Left  Minimal Assistance - Patient > 75%    Supine to Sit  Minimal Assistance - Patient > 75%    Sit to Supine  Minimal Assistance - Patient > 75%      Transfers   Transfers  Squat Pivot Transfers    Squat Pivot Transfers  2: Max assist           Vestibular Assessment - 04/29/18 1431      Vestibular Assessment   General Observation  The patient reports that she is 3/10 baseline dizziness.  She notes she sometimes has triple quadruple spinning.        Symptom Behavior   Type of Dizziness  Spinning    Frequency of Dizziness  days    Duration of Dizziness  days    Aggravating Factors  Activity in general    Relieving Factors  No known relieving factors      Occulomotor Exam   Occulomotor Alignment  Normal    Spontaneous  Absent    Gaze-induced  Absent    Smooth Pursuits  Intact    Saccades  Intact      Vestibulo-Occular Reflex   VOR 1 Head Only (x 1 viewing)  Gave x 1 patient has difficulty maintaining fixation on target with head motion; she has visual blurring with head motion.      Positional Testing   Sidelying Test  Sidelying Right;Sidelying Left    Horizontal Canal Testing  Horizontal Canal Right;Horizontal Canal Left      Sidelying Right   Sidelying Right Duration  subjectively reports dizziness rated 6/10 that lasts x minutes    Sidelying Right Symptoms  No nystagmus      Sidelying Left   Sidelying Left Duration  subjectively reports dizziness rated 6/10 that lasts minutes    Sidelying Left Symptoms  No nystagmus      Horizontal Canal Right   Horizontal Canal Right Duration  notes sensation of movement x minutes    Horizontal Canal Right  Symptoms  Normal      Horizontal Canal Left    Horizontal Canal Left Duration  notes sensation of movement x minutes    Horizontal Canal Left Symptoms  Normal      Positional Sensitivities   Positional Sensitivities Comments  Each positional movement increases symptoms from baseline report of 3/10 up to 6/10.  Symptoms do begin to settle after minutes in each position.           Objective measurements completed on examination: See above findings.       Vestibular Treatment/Exercise - 04/30/18 0955      Vestibular Treatment/Exercise   Vestibular Treatment Provided  Habituation    Habituation Exercises  Horizontal Roll      Horizontal Roll   Number of Reps   2    Symptom Description   Patient's symtpoms remain 6/10 with 2 reps of rolling.  Did not yet add for HEP *plan to establish program for motion sensitivity to tolerance at next session.            PT Education - 04/30/18 1003    Education Details  discussed vestibular rehab and habituation for motion sensitivity    Person(s) Educated  Patient;Caregiver(s)    Methods  Explanation    Comprehension  Verbalized understanding          PT Long Term Goals - 04/30/18 1004      PT LONG TERM GOAL #1   Title  The patient will return demo habituation HEP for motion sensitivity with assist from caregiver.    Time  4    Period  Weeks    Target Date  06/14/18      PT LONG TERM GOAL #2   Title  The patient will report baseline dizziness 1/10 or less to be able to sit up and be out of the bed during the day.    Time  4    Period  Weeks    Target Date  06/14/18      PT LONG TERM GOAL #3   Title  The patient will be able to report return to scooter use in home demonstrating dec'd subjective vertigo.    Time  4    Period  Weeks    Target Date  06/14/18      PT LONG TERM GOAL #4   Title  The patient will report dizziness < or equal to 3/10 with bed mobility tasks.    Time  4    Period  Weeks    Target Date  06/14/18             Plan - 04/30/18 1006     Clinical Impression Statement  The patient is a 57 year old female presenting to outpatient physical therapy with vertigo that limits ability to use scooter in home, and to get out of bed when severe.  The patient was negative today for positional testing, but does have impaired VOR with clinical assessment.  She also has motion sensitivty with all positional testing.  PT to establish a HEP for gaze adaptation and habituation and work to improve motion tolerance to be able to get out of bed and return to prior status. Patient's condition limited by chronic mobility imapirments related to MS.     History and Personal Factors relevant to plan of care:  MS, ataxia, muscle weakness, dependent for caregiver for mobility    Clinical Presentation  Stable    Clinical Presentation due to:  focus  of evaluation on vertigo (not addressing all mobility deficits related to MS)    Clinical Decision Making  Low    Rehab Potential  Good    PT Frequency  1x / week   eval +   PT Duration  4 weeks    PT Treatment/Interventions  ADLs/Self Care Home Management;Balance training;Neuromuscular re-education;Canalith Repostioning;Vestibular;Patient/family education;Therapeutic exercise;Therapeutic activities    PT Next Visit Plan  Establish HEP for gaze x 1, habituation for bed mobility, seated head motion.     Consulted and Agree with Plan of Care  Patient       Patient will benefit from skilled therapeutic intervention in order to improve the following deficits and impairments:  Dizziness, Decreased activity tolerance, Impaired vision/preception, Decreased mobility  Visit Diagnosis: Dizziness and giddiness  Unsteadiness on feet     Problem List Patient Active Problem List   Diagnosis Date Noted  . Vertigo 02/02/2017  . Left leg weakness 02/02/2017  . Right sided sciatica 11/19/2016  . Trochanteric bursitis of right hip 11/19/2016  . Benign paroxysmal positional vertigo 03/24/2016  . Pedal edema 01/30/2016   . Dysesthesia 01/30/2016  . Gastroenteritis 08/16/2014  . Nausea vomiting and diarrhea 08/16/2014  . Hypokalemia 08/16/2014  . Multiple sclerosis ([place]) 07/24/2014  . Ataxic gait 07/24/2014  . Chronic fatigue 07/24/2014  . Urinary dysfunction 07/24/2014  . Chronic insomnia 07/24/2014  . Depression with anxiety 07/24/2014    Biannca Scantlin, PT 04/30/2018, 10:12 AM  [facility] [address] [place], [place], [address] Phone: [phone]   Fax:  [phone]  Name: Tonya Powers MRN: [id number] Date of Birth: Mar 19, 1961

## 2018-05-03 ENCOUNTER — Telehealth: Payer: Self-pay | Admitting: *Deleted

## 2018-05-03 NOTE — Telephone Encounter (Signed)
PA for generic Adderall started via covermemeds (key: [id number]).  [employer] Medicare [phone]).  Pt EL#[id number].  Decision pending.

## 2018-05-04 ENCOUNTER — Encounter: Payer: Self-pay | Admitting: *Deleted

## 2018-05-04 ENCOUNTER — Other Ambulatory Visit: Payer: Self-pay | Admitting: *Deleted

## 2018-05-04 NOTE — Telephone Encounter (Signed)
[email or web address]@ [employer] has called to inform RN Sharyn Lull that they have received the request and to allow 7 day turn around time.  Clinical information can be faxed to [phone]

## 2018-05-04 NOTE — Telephone Encounter (Signed)
Inita @ [employer] called to inform of denial on amphetamine-dextroamphetamine (ADDERALL) 20 MG tablet, there are other medications that need to have been tried and failed.  Rodena Piety welcomed a call back at [phone] option 5

## 2018-05-04 NOTE — Telephone Encounter (Signed)
Spoke to [employer] - they offered the following medications:  1) amphetamine-dextroamphetamine ER capsules 2) dexmethylphenidate tablets  Per Abigail Butts, she has tried the generic ER Adderall in the past and it did not work well for her.  With the failure of one of the options above, she is eligible for an appeal.  We will attempt to get the immediate release approved.  She is currently paying out-of-pocket with [email or web address] coupons.

## 2018-05-06 DIAGNOSIS — L821 Other seborrheic keratosis: Secondary | ICD-10-CM | POA: Diagnosis not present

## 2018-05-06 DIAGNOSIS — Z85828 Personal history of other malignant neoplasm of skin: Secondary | ICD-10-CM | POA: Diagnosis not present

## 2018-05-06 DIAGNOSIS — L570 Actinic keratosis: Secondary | ICD-10-CM | POA: Diagnosis not present

## 2018-05-06 DIAGNOSIS — L718 Other rosacea: Secondary | ICD-10-CM | POA: Diagnosis not present

## 2018-05-09 NOTE — Progress Notes (Signed)
Ms. Audi received her flu shot today to her LT deltoid at the [facility] by Lazaro Arms, RN [phone] JSI:[id number] WHK:[id number] Exp:12/27/18

## 2018-05-12 NOTE — Telephone Encounter (Signed)
The appeal for her medication has been approved through 05/04/2019.  However, it was at a Tier 4 benefit drug level and is not eligible for a tier exception.  The patient is aware.  She is going to check with the pharmacy on the least expensive way to fill the prescription (insurance benefits vs goodrx coupon).  She appreciated our time in this matter.

## 2018-05-17 DIAGNOSIS — N951 Menopausal and female climacteric states: Secondary | ICD-10-CM | POA: Diagnosis not present

## 2018-05-18 ENCOUNTER — Encounter: Payer: Medicare Other | Admitting: Rehabilitative and Restorative Service Providers"

## 2018-05-29 ENCOUNTER — Other Ambulatory Visit: Payer: Self-pay | Admitting: Neurology

## 2018-06-02 ENCOUNTER — Ambulatory Visit: Payer: Medicare Other | Admitting: Rehabilitative and Restorative Service Providers"

## 2018-06-13 ENCOUNTER — Other Ambulatory Visit: Payer: Self-pay | Admitting: Neurology

## 2018-06-13 ENCOUNTER — Telehealth: Payer: Self-pay | Admitting: Neurology

## 2018-06-13 MED ORDER — AMPHETAMINE-DEXTROAMPHETAMINE 20 MG PO TABS: 20.0000 mg | ORAL_TABLET | Freq: Two times a day (BID) | ORAL | 0 refills | Status: DC

## 2018-06-13 NOTE — Telephone Encounter (Signed)
Gave completed/signed orders to [facility]fusion to call and schedule pt for IV solumedrol 1G once.

## 2018-06-13 NOTE — Telephone Encounter (Addendum)
I called pt. Advised I sent refill request for adderall to Dr. Felecia Shelling. She is going to bring disability form needing to be filled out to our office. She needs this paperwork in by 06/22/18.   Advised we are waiting on Dr. Felecia Shelling to sign orders for infusion. And then will give to [facility] to call and schedule her. She has increased pain/fatigue. She called and LVM for [facility]fusion first thing this morning but has not heard back.

## 2018-06-13 NOTE — Telephone Encounter (Signed)
I checked drug registry. She last refilled Adderall 04/29/2018 #60. Last seen 03/10/18 and next f/u 09/14/2018. Sent request to Dr. Felecia Shelling.

## 2018-06-13 NOTE — Telephone Encounter (Signed)
Pt has requested a refill on her amphetamine-dextroamphetamine (ADDERALL) 20 MG tablet [facility] #[id number] Pt states it is time for her annual disability form to be completed by RN for her.  Pt is also asking to be called to be scheduled for her infusion.  Please call

## 2018-06-13 NOTE — Addendum Note (Signed)
Addended by: Rossie Muskrat L on: 06/13/2018 01:08 PM   Modules accepted: Orders

## 2018-06-13 NOTE — Telephone Encounter (Signed)
Pt is requesting a requesting a refill on her amphetamine-dextroamphetamine (ADDERALL) 20 MG tablet [facility] [phone]  Pt is also wanting it known that it is time that her disability form be filled out again(as it is generally done for her annually by RN Faith). Pt is asking for a call to be scheduled for her infusion also

## 2018-06-14 ENCOUNTER — Telehealth: Payer: Self-pay | Admitting: Neurology

## 2018-06-14 NOTE — Telephone Encounter (Signed)
ERROR

## 2018-06-14 NOTE — Telephone Encounter (Signed)
Transferred patient to intrafusion department.

## 2018-06-14 NOTE — Telephone Encounter (Signed)
Pt has called for infusion suite, phone rep had to go to the infusion suite on behalf of pt to ask for time she could be worked in.

## 2018-06-15 ENCOUNTER — Telehealth: Payer: Self-pay | Admitting: *Deleted

## 2018-06-15 NOTE — Telephone Encounter (Signed)
Pt is coming in tomorrow 12/19 for an infusion stating she will pick up a copy of the forms then.

## 2018-06-15 NOTE — Telephone Encounter (Signed)
Noted.  Forms up front [employer]/fim

## 2018-06-15 NOTE — Telephone Encounter (Signed)
LMOM.  Disability forms are complete. Pt. requested they be mailed to her. Mail from our office seems to take longer to reach it's destination; if she needs this quickly, I can put a copy up front for her to pick up, or fax it to her.  I have mailed a copy to her home address just in case she still wants it mailed/fim

## 2018-06-16 DIAGNOSIS — G35 Multiple sclerosis: Secondary | ICD-10-CM | POA: Diagnosis not present

## 2018-06-27 ENCOUNTER — Other Ambulatory Visit: Payer: Self-pay | Admitting: Neurology

## 2018-06-30 NOTE — Telephone Encounter (Signed)
Will send refill request for tramadol to [facility] in Dr. Garth Bigness absence. Last order of tramadol on 05/30/2018 for 30 days. [facility] Registry checked and verified. Pt has a scheduled appt in March of 2020.

## 2018-07-05 ENCOUNTER — Other Ambulatory Visit: Payer: Self-pay | Admitting: *Deleted

## 2018-07-05 ENCOUNTER — Telehealth: Payer: Self-pay | Admitting: Neurology

## 2018-07-05 DIAGNOSIS — R300 Dysuria: Secondary | ICD-10-CM

## 2018-07-05 DIAGNOSIS — G35 Multiple sclerosis: Secondary | ICD-10-CM

## 2018-07-05 DIAGNOSIS — R8290 Unspecified abnormal findings in urine: Secondary | ICD-10-CM

## 2018-07-05 NOTE — Telephone Encounter (Signed)
Tonya Powers checked their records and patient received IV solumedrol x1 day the last few times on 06/16/18, 04/27/18, 03/10/18, 01/27/18, 01/26/18.

## 2018-07-05 NOTE — Telephone Encounter (Signed)
Checking with Otila Kluver in infusion suite to see when she last received IV steroids.

## 2018-07-05 NOTE — Telephone Encounter (Signed)
I called patient back. She states she did not realize it had not even been a month since last IV solumedrol. She has a lot of stress right now. She has increased generalized pain. Having some vertigo mostly at night but sometimes in the morning now. Her Dad just had gallbladder surgery today. Feels stress increasing vertigo problems.   Her balance has been worse lately. She fell this morning in the bathroom. She had no significant injuries. Was able to get herself off the floor.   She experiences double vision at night. Today and yesterday she also had double vision. Only lasts for a short period of time. Taking OTC Vertofix for this. Double vision started to worsen in the last two weeks.   Advised I will talk with Dr. Felecia Shelling to see if ok to bring her in tomorrow for infusion and call her back shortly to let her know. She verbalized understanding.

## 2018-07-05 NOTE — Telephone Encounter (Signed)
Pt requesting a call stating she would like to come in for an infusion- did not wish to discuss further.

## 2018-07-05 NOTE — Telephone Encounter (Signed)
Spoke with Dr. Felecia Shelling- he is ok for her to come in for solumedrol IV 1000mg  x1 day. He would also like UA and culture to make sure this is not contributing to her sx. Gave signed order to Otila Kluver in intrafusion.

## 2018-07-05 NOTE — Telephone Encounter (Addendum)
I called pt. She will come tomorrow at 1pm for infusion.  Placed pt on lab schedule for tomorrow and placed order for Urine, culture and urinalysis w/ reflex per Dr. Felecia Shelling.

## 2018-07-06 ENCOUNTER — Telehealth: Payer: Self-pay | Admitting: Neurology

## 2018-07-06 ENCOUNTER — Other Ambulatory Visit: Payer: Self-pay

## 2018-07-06 NOTE — Telephone Encounter (Signed)
Pt called back and spoke with Otila Kluver. She thought appt was for tomorrow. She is coming tomorrow per [employer].

## 2018-07-06 NOTE — Telephone Encounter (Signed)
pt has called for the intrafusion suite, call transferred °

## 2018-07-06 NOTE — Telephone Encounter (Signed)
Called, LVM for pt. Tonya Powers informed me she had not shown up for infusion that was scheduled for today yet. Wanted to make sure everything was okay. Asked her to call back.

## 2018-07-07 DIAGNOSIS — G35 Multiple sclerosis: Secondary | ICD-10-CM | POA: Diagnosis not present

## 2018-07-08 DIAGNOSIS — G35 Multiple sclerosis: Secondary | ICD-10-CM | POA: Diagnosis not present

## 2018-07-08 DIAGNOSIS — R300 Dysuria: Secondary | ICD-10-CM | POA: Diagnosis not present

## 2018-07-08 DIAGNOSIS — R8290 Unspecified abnormal findings in urine: Secondary | ICD-10-CM | POA: Diagnosis not present

## 2018-07-08 NOTE — Addendum Note (Signed)
Addended by: Inis Sizer D on: 07/08/2018 11:07 AM   Modules accepted: Orders

## 2018-07-09 LAB — URINALYSIS, ROUTINE W REFLEX MICROSCOPIC
BILIRUBIN UA: NEGATIVE
Leukocytes, UA: NEGATIVE
Nitrite, UA: NEGATIVE
PH UA: 7 (ref 5.0–7.5)
Protein, UA: NEGATIVE
RBC UA: NEGATIVE
UUROB: 0.2 mg/dL (ref 0.2–1.0)

## 2018-07-12 ENCOUNTER — Other Ambulatory Visit: Payer: Self-pay | Admitting: Neurology

## 2018-07-12 ENCOUNTER — Telehealth: Payer: Self-pay | Admitting: *Deleted

## 2018-07-12 LAB — URINE CULTURE

## 2018-07-12 MED ORDER — NITROFURANTOIN MONOHYD MACRO 100 MG PO CAPS: 100.0000 mg | ORAL_CAPSULE | Freq: Two times a day (BID) | ORAL | 0 refills | Status: DC

## 2018-07-12 NOTE — Telephone Encounter (Signed)
Called and spoke with pt about results per Dr. Felecia Shelling note. Pt verbalized understanding. She will pick up rx macrobid from pharmacy. Went over directions: Take 1 tablet by mouth twice daily until finished.

## 2018-07-12 NOTE — Telephone Encounter (Signed)
-----   Message from Britt Bottom, MD sent at 07/12/2018  1:05 PM EST ----- Please let the patient know that the Urine culture did grow some bacteria and I sent in nitrofurantoin to the pharmacy.

## 2018-07-21 DIAGNOSIS — G35 Multiple sclerosis: Secondary | ICD-10-CM | POA: Diagnosis not present

## 2018-07-21 DIAGNOSIS — G4700 Insomnia, unspecified: Secondary | ICD-10-CM | POA: Diagnosis not present

## 2018-07-21 DIAGNOSIS — R270 Ataxia, unspecified: Secondary | ICD-10-CM | POA: Diagnosis not present

## 2018-07-21 DIAGNOSIS — Z0000 Encounter for general adult medical examination without abnormal findings: Secondary | ICD-10-CM | POA: Diagnosis not present

## 2018-07-22 ENCOUNTER — Other Ambulatory Visit: Payer: Self-pay | Admitting: Family Medicine

## 2018-07-22 DIAGNOSIS — M8580 Other specified disorders of bone density and structure, unspecified site: Secondary | ICD-10-CM

## 2018-07-28 ENCOUNTER — Other Ambulatory Visit: Payer: Self-pay | Admitting: Diagnostic Neuroimaging

## 2018-07-28 NOTE — Telephone Encounter (Signed)
Drug registry checked.  Tramadol 50mg  po TID prn # 90 last fill 06-30-2018.

## 2018-08-01 ENCOUNTER — Other Ambulatory Visit: Payer: Self-pay | Admitting: Neurology

## 2018-08-02 ENCOUNTER — Other Ambulatory Visit: Payer: Self-pay | Admitting: Neurology

## 2018-08-02 MED ORDER — AMPHETAMINE-DEXTROAMPHETAMINE 20 MG PO TABS: 20.0000 mg | ORAL_TABLET | Freq: Two times a day (BID) | ORAL | 0 refills | Status: DC

## 2018-08-02 NOTE — Addendum Note (Signed)
Addended by: Hope Pigeon on: 08/02/2018 10:51 AM   Modules accepted: Orders

## 2018-08-02 NOTE — Telephone Encounter (Signed)
Pt has called for a refill on her amphetamine-dextroamphetamine (ADDERALL) 20 MG tablet [facility] [phone]

## 2018-08-03 MED ORDER — AMPHETAMINE-DEXTROAMPHETAMINE 20 MG PO TABS: 20.0000 mg | ORAL_TABLET | Freq: Two times a day (BID) | ORAL | 0 refills | Status: DC

## 2018-08-03 NOTE — Telephone Encounter (Signed)
Pt states [facility] #[id number]  Is out of her   amphetamine-dextroamphetamine (ADDERALL) 20 MG tablet  Pt is being told by the [facility] on [address] [place], [place] [address]  That they will need a new script sent to them

## 2018-08-03 NOTE — Addendum Note (Signed)
Addended by: Hope Pigeon on: 08/03/2018 02:45 PM   Modules accepted: Orders

## 2018-08-25 ENCOUNTER — Telehealth: Payer: Self-pay | Admitting: Neurology

## 2018-08-25 NOTE — Telephone Encounter (Signed)
Pt is asking for a call from RN Terrence Dupont to discuss her skin irritations as it relates to her MS

## 2018-08-25 NOTE — Telephone Encounter (Signed)
I called pt back. Skin sx have worsened. Feet are in increased pain, causing more stress. Face broke out in rash/itching. Not sleeping well because of this. Having burning pain in her feet. She saw GYN and they checked labs and did not think there was anything causing sx. Tried her on BC/hormonal replacement and not helping. Feels like her feet are "on fire". These are not new sx. At this point, she is willing to try antidepressant and gabapentin at this time. Advised Dr. Felecia Shelling out of the office today. I will discuss with him tomorrow and call her back to recommend next steps. She verbalized understanding.

## 2018-08-25 NOTE — Telephone Encounter (Signed)
Dr. Sater- do you have any recommendations? 

## 2018-08-26 MED ORDER — DULOXETINE HCL 60 MG PO CPEP: 60.0000 mg | ORAL_CAPSULE | Freq: Every day | ORAL | 3 refills | Status: DC

## 2018-08-26 NOTE — Telephone Encounter (Signed)
I contacted the pt and she was agreeable to trying the duloxetine 60 mg tablets. She requested the rx to be sent to [facility] on [address]. I submitted Duloxetine 60 mg 1 tab daily # 30 wit 3 refills electronically and received confirmation. Pt had no further questions/concerns at this time.

## 2018-08-26 NOTE — Addendum Note (Signed)
Addended by: Verlin Grills T on: 08/26/2018 01:12 PM   Modules accepted: Orders

## 2018-08-26 NOTE — Telephone Encounter (Signed)
Lets try duloxetine 60 mg daily.   Continue gabapentin

## 2018-08-29 ENCOUNTER — Telehealth: Payer: Self-pay | Admitting: Neurology

## 2018-08-29 DIAGNOSIS — G35 Multiple sclerosis: Secondary | ICD-10-CM

## 2018-09-01 MED ORDER — METHYLPREDNISOLONE 4 MG PO TBPK: ORAL_TABLET | ORAL | 0 refills | Status: DC

## 2018-09-01 NOTE — Addendum Note (Signed)
Addended by: Hope Pigeon on: 09/01/2018 01:48 PM   Modules accepted: Orders

## 2018-09-01 NOTE — Telephone Encounter (Signed)
Spoke with Dr. Felecia Shelling- he recommends doing repeat MRI cervical spine w/wo contrast and calling in steroid dose pack for to see if will help with sx.

## 2018-09-01 NOTE — Telephone Encounter (Signed)
Patient is calling in stating that she is having pain in her skull that shoots down to the left side of her throat

## 2018-09-01 NOTE — Telephone Encounter (Signed)
Called pt back. She has had a soreness/achiness in her neck recently. Pain starts at base of skull and radiates down the left side to the top of her back that started this past Friday. Heat ineffective. Gabapentin ineffective.  She also reports "Popping" in her neck that started about two weeks that also causes pain. PCP told her it was nothing to worry about but she is concerned.   She recently started on duloxetine. She has not been eating much since starting medication. Food does not taste good to her. She is sleeping all the time. Denies having sx of infection.   Advised I will speak with Dr. Felecia Shelling and call her back with recommendations.

## 2018-09-05 ENCOUNTER — Telehealth: Payer: Self-pay | Admitting: Neurology

## 2018-09-05 ENCOUNTER — Other Ambulatory Visit: Payer: Self-pay | Admitting: Family Medicine

## 2018-09-05 DIAGNOSIS — Z1231 Encounter for screening mammogram for malignant neoplasm of breast: Secondary | ICD-10-CM

## 2018-09-05 NOTE — Telephone Encounter (Signed)
[employer] Josem Kaufmann: [id number] (exp. 09/05/18 to 10/04/18) order faxed to [facility] they will reach out to the pt to schedule. Patient request an open MRI.

## 2018-09-12 DIAGNOSIS — M47812 Spondylosis without myelopathy or radiculopathy, cervical region: Secondary | ICD-10-CM | POA: Diagnosis not present

## 2018-09-12 DIAGNOSIS — G35 Multiple sclerosis: Secondary | ICD-10-CM | POA: Diagnosis not present

## 2018-09-14 ENCOUNTER — Ambulatory Visit: Payer: Medicare Other | Admitting: Neurology

## 2018-09-14 ENCOUNTER — Encounter

## 2018-09-14 ENCOUNTER — Other Ambulatory Visit: Payer: Self-pay

## 2018-09-14 ENCOUNTER — Encounter: Payer: Self-pay | Admitting: Neurology

## 2018-09-14 VITALS — BP 105/60 | HR 83 | Ht 65.0 in

## 2018-09-14 DIAGNOSIS — M542 Cervicalgia: Secondary | ICD-10-CM | POA: Insufficient documentation

## 2018-09-14 DIAGNOSIS — G35 Multiple sclerosis: Secondary | ICD-10-CM

## 2018-09-14 DIAGNOSIS — R5382 Chronic fatigue, unspecified: Secondary | ICD-10-CM | POA: Diagnosis not present

## 2018-09-14 DIAGNOSIS — R29898 Other symptoms and signs involving the musculoskeletal system: Secondary | ICD-10-CM

## 2018-09-14 DIAGNOSIS — R42 Dizziness and giddiness: Secondary | ICD-10-CM

## 2018-09-14 DIAGNOSIS — F418 Other specified anxiety disorders: Secondary | ICD-10-CM

## 2018-09-14 MED ORDER — SERTRALINE HCL 50 MG PO TABS: 50.0000 mg | ORAL_TABLET | Freq: Every day | ORAL | 11 refills | Status: DC

## 2018-09-14 MED ORDER — CLONAZEPAM 1 MG PO TABS: 1.0000 mg | ORAL_TABLET | Freq: Every evening | ORAL | 3 refills | Status: DC | PRN

## 2018-09-14 MED ORDER — ZOLPIDEM TARTRATE 10 MG PO TABS: 10.0000 mg | ORAL_TABLET | Freq: Every evening | ORAL | 5 refills | Status: DC | PRN

## 2018-09-14 MED ORDER — AMPHETAMINE-DEXTROAMPHETAMINE 20 MG PO TABS: 20.0000 mg | ORAL_TABLET | Freq: Two times a day (BID) | ORAL | 0 refills | Status: DC

## 2018-09-14 NOTE — Progress Notes (Signed)
[facility]  PATIENT: Tonya Powers DOB: 03-28-1961  REFERRING CLINICIAN: Mayra Neer is PCP HISTORY FROM: Patient  REASON FOR VISIT: MS and poor gait   HISTORICAL  CHIEF COMPLAINT:  Chief Complaint  Patient presents with   Follow-up    RM 12, alone. Last seen 03/10/2018   Multiple Sclerosis    Not on DMT.    Gait Problem    In electric scooter today    HISTORY OF PRESENT ILLNESS:  Tonya Powers is a 58 y.o. woman with MS.      Update 09/14/2018: Since the last visit, she had an MRI of the cervical spine due to more weakness and left neck pain.    I personally reviewed the MRI of the cervical spine performed 09/13/2018.   It shows multiple T2/flair hyperintense foci in the spinal cord.  I compared it side-by-side with an MRI of the brain performed 02/28/2016 and there are no definite new lesions.   Several of these are lateral and will be expected to affect strength and spasticity.  She also has some degenerative changes but not severe enough to lead to nerve root compression at any of the levels.  There is no spinal stenosis.  She has leg weakness, left > right, and some spasticity.    Intermittent steroids prn have helped her.   She is no longer on a DMT.  She continues to have skin dysesthesias, worse in the left neck/shoulder and the right hip and down.  She also has pain in the left lateral neck of the jaw.  She is on Nucynta when pain is worse and tramadol most days  Her anxiety is doing better on duloxetine but it has not helped the pain and it seems to have changed her taste --- everything is sour.     We discussed switching to an SSRI as  Update 03/10/2018: For the past 2 weeks, she has had more vertigo.  The onset was over a day.  Meclizine has not helped.  The sister did an Epley maneuver or other vestibular exercise and noted that there was nystagmus.  Over the past week the vertigo persisted and she went to [facility]/07/2018 and received IV  Ativan with benefit that day.  Zofran greatly helped the nausea.  Initially, she had significant nausea and also some vomiting.  She continues to experience a lot of pain in the right hip and buttock region.  In the past, piriformis and/or trochanteric bursa injections have helped her for many weeks.  Lamotrigine has helped this pain some but oxcarbazepine did not.  She is not currently on a disease modifying therapy for her MS.  She did not feel that she got any benefit from [place] and also was progressing on other medications.  Her main problem is gait and she has bilateral leg weakness.  She can only take a few steps with bilateral support.  She mostly uses a scooter.  She has had a fair amount of fatigue and reports attention deficit disorder.  She has had some depression but mood is doing fairly well currently.     Update 01/03/2018: She is noting more difficulty with her painful dysesthesias.  The wrose pain is in her right hip and left > right  legs but she has skin dysesthestic pain from her chest down (not much in arms).    Her feet get very red with swelling as the day goes on.     She is on lamotrigine  200 mg po bid, gabapentin 800 mg po qid and Nucynta (prn only)    She uses her scooter mostly and can go 20 feet or so with her walker but not repeatedly as it wears her out.   She has left > right leg weakness.   Arms are strong.    She has insomnia and takes clonazepam and doxepin nightly.   She also has anxiety (not depression) and will occ take a valium if this is more of a problem.      Update 04/29/2017: She felt more weakness and vertigo on Ocrelizumab and she has stopped.   She is not currently on any DMT.   She takes steroids IV every month or two and notes improvement of fatigue and general strength. She has left > right leg weakness and spasticity.   She feels gait is worse and she has more trouble lifting the left leg.   With a walker she can go 10-20 feet and she uses a scooter  mostly.     She can transfer though this is sometimes difficult and she has had falls.   Ampyra had not helped.    She has dysesthetic pain helped by tramadol, gabapentin and lamotrigine.   She uses Nucynta sparingly.    She still has bouts of severe vertigo.   She does Epley maneuvers when it reoccurs.     She has low back pain that intensifies with prolonged siting.   Solanpas lidocaine patches help some.   Piriformis shots have also helped.    She has fatigue daily.  Adderall has greatly helped her fatigue and poor focus.  She has trouble getting comfortable at night according insomnia.  Ambien, clonazepam and doxepin help her sleep but without one of them, sleep is worse.        From 02/02/2017:   Vertigo:    She has had vertigo x 15 days.   She has been on meclizine, phenergan, Medrol dosepak, clonazepam (not sure if helped but sleeps better)and IV Solu-Medrol.  Dramamine may have helped slightly.   She has vertigo while still and has diplopia.    Initially she also had N/V.  The diplopia is worse with movements.     She went to the ED and had a Morphine shot when pain was worse and also had IV fluids.     MS:   She is back on the Ocrevus.   She thinks her MS is probably stable but is concerned that the vertigo might be a new exacerbation.Marland Kitchen    She was on Tysabri and was stable but converted to JCV Ab positive and stopped.   She could not tolerate Tecfidera.        Gait/strength :    Her gait is very poor and she mostly uses a scooter. She can use a walker for a few steps (probably up to 20 feet).     She has left > right leg weakness and spasticity.       Dysesthesia/hip pain:   She continues to report burning pain in the right leg and right flank lamotrigine has only helps the pain a little bit.    Tegretol and gabapentin have not helped much.   TCA's have not helped.     Baclofen made her feel weaker and she stopped.     Nucynta is too expensive.   Tramadol has not helped much   Bladder/bowel:   She has a difficult bladder dysfunction with frequent incontinence. She  has quite a bit of hesitancy, worse when she uses the opiates (now off of these).    Tamsulosin did not really help the hesitancy. Desmopressin helps her to the bed at night to help her more. She still has frequent incontinence.     She reports constipation. Linzess did not help.    Nucynta does not worsen constipation like opiates did but is very expensive.  Fatigue/sleep:   She has fatigue most days, worse with heat. Adderall has helped.   She does well doing pool exercises.,  When fatiue is more severe she takes  Solu-Medrol infusions.   She has both sleep onset and sleep maintenance insomnia.   She takes clonazepam 1 mg and Ambien every night due to difficulty falling and staying asleep. Clonazepam also helps her leg spasms as well as helps her to fall asleep.   Initially, does note progressive help her sleep but now she is having a lot of incontinence again for   Mood/cognition:   She notes mild depression and anxiety.    She prefers not to take an antidepressant.    She rarely takes Xanax 0.5 mg when she has more anxiety. She notes more trouble with cognition, especially short term memory.    Adderall helps focus some    MS History:  She presented with optic neuritis in 1991 followed shortly by difficulties with leg weakness and by right trigeminal neuralgia. In retrospect, couple years earlier when she had her daughter, has some difficulties with her legs and needed to go on short-term disability. At first she was not diagnosed with MS but after more of the symptoms she underwent MRI testing and had a lumbar puncture by Dr. Johnnye Sima. The imaging and the CSF was consistent with multiple sclerosis. When Betaseron became available she was placed on that. She was on Betaseron for about 10 years. She felt that she did not have too many exacerbations during that time but she had a lot of difficulty tolerating the Betaseron due to skin  reactions. Around 12-15 years ago, she started to use a cane and she has had progressive gait disturbance over the last decade. Around the house for short distances, she uses a walker but uses her scooter for longer distances. Outside she uses a wheelchair pushed by others   REVIEW OF SYSTEMS:  Constitutional: No fevers, chills, sweats, or change in appetite.  She has Fatigue Eyes: No visual changes, double vision, eye pain Ear, nose and throat: No hearing loss, ear pain, nasal congestion, sore throat Cardiovascular: No chest pain, palpitations Respiratory:  No shortness of breath at rest or with exertion.   No wheezes GastrointestinaI: Mild dysphagia.  Constipation.  No nausea, vomiting, diarrhea.   Genitourinary:  No dysuria but has urinary retention and frequency.  Desmopressin helps nocturia. Musculoskeletal:  No neck pain,   Has lower back pain and buttocks.  Hips ans other joints ok Integumentary: No rash, pruritus, skin lesions Neurological: as above Psychiatric: see above Endocrine: No palpitations, diaphoresis, change in appetite, change in weigh or increased thirst Hematologic/Lymphatic:  No anemia, purpura, petechiae. Allergic/Immunologic: No itchy/runny eyes, nasal congestion, recent allergic reactions, rashes  ALLERGIES: Allergies  Allergen Reactions   Codeine Nausea And Vomiting    HOME MEDICATIONS: Outpatient Medications Prior to Visit  Medication Sig Dispense Refill   acetaminophen (TYLENOL) 500 MG tablet Take 1,000 mg by mouth daily as needed for pain.     ALPRAZolam (XANAX) 0.5 MG tablet Take 0.5 mg by mouth daily as needed  for anxiety.      Cholecalciferol (VITAMIN D PO) Take 1 tablet by mouth daily.     desmopressin (DDAVP) 0.1 MG tablet Take 1 tablet (0.1 mg total) by mouth at bedtime as needed (bladder.). 30 tablet 11   diazepam (VALIUM) 5 MG tablet Take 1 tablet (5 mg total) by mouth every 8 (eight) hours as needed. 90 tablet 5   estradiol (CLIMARA -  DOSED IN MG/24 HR) 0.1 mg/24hr patch Place 0.1 mg onto the skin once a week.  5   gabapentin (NEURONTIN) 800 MG tablet TAKE 1 TABLET(800 MG) BY MOUTH FOUR TIMES DAILY (Patient taking differently: Take 800 mg by mouth 3 (three) times daily. ) 120 tablet 11   ibuprofen (ADVIL,MOTRIN) 200 MG tablet Take 400 mg by mouth every 6 (six) hours as needed for headache (headache).     lamoTRIgine (LAMICTAL) 200 MG tablet Take 1 tablet (200 mg total) by mouth 3 (three) times daily. 270 tablet 1   lidocaine (LIDODERM) 5 % Place 1 patch daily onto the skin. Remove & Discard patch within 12 hours or as directed by MD 30 patch 11   meclizine (ANTIVERT) 25 MG tablet Take 1 tablet (25 mg total) by mouth 3 (three) times daily as needed for dizziness. 30 tablet 0   methylPREDNISolone (MEDROL DOSEPAK) 4 MG TBPK tablet Take 6 tablets on day 1 and decrease by 1 tablet each day until finished 21 tablet 0   nitrofurantoin, macrocrystal-monohydrate, (MACROBID) 100 MG capsule Take 1 capsule (100 mg total) by mouth 2 (two) times daily. 14 capsule 0   ondansetron (ZOFRAN) 4 MG tablet Take 1 tablet (4 mg total) by mouth every 8 (eight) hours as needed for nausea or vomiting. 21 tablet 3   promethazine (PHENERGAN) 25 MG tablet Take 1 tablet (25 mg total) by mouth every 6 (six) hours as needed for nausea (nausea). 30 tablet 3   tapentadol (NUCYNTA) 50 MG tablet Take 1 tablet (50 mg total) by mouth 3 (three) times daily as needed for moderate pain or severe pain (pain). 90 tablet 0   traMADol (ULTRAM) 50 MG tablet TAKE 1 TABLET BY MOUTH THREE TIMES DAILY AS NEEDED FOR MODERATE PAIN 90 tablet 5   amphetamine-dextroamphetamine (ADDERALL) 20 MG tablet Take 1 tablet (20 mg total) by mouth 2 (two) times daily. 60 tablet 0   ciprofloxacin (CIPRO) 500 MG tablet Take 500 mg by mouth 2 (two) times daily.  0   clonazePAM (KLONOPIN) 1 MG tablet TAKE 1 TABLET BY MOUTH AT BEDTIME AS NEEDED 30 tablet 1   doxepin (SINEQUAN) 10 MG  capsule TAKE 1 CAPSULE BY MOUTH EVERY NIGHT AT BEDTIME AS NEEDED 90 capsule 0   DULoxetine (CYMBALTA) 60 MG capsule Take 1 capsule (60 mg total) by mouth daily. 30 capsule 3   ocrelizumab 600 mg in sodium chloride 0.9 % 500 mL Inject 600 mg into the vein every 6 (six) months.     zolpidem (AMBIEN) 10 MG tablet TAKE 1 TABLET BY MOUTH AT BEDTIME AS NEEDED FOR SLEEP 30 tablet 5   No facility-administered medications prior to visit.     PAST MEDICAL HISTORY: Past Medical History:  Diagnosis Date   Anxiety    Depression    Gait instability    uses wheelchair or assistance   H/O steroid therapy    IV infusion every 6 to 8 weeks- last 05-31-14 [facility]   History of breast biopsy    Multiple sclerosis ([place])    Neuromuscular  disorder ([place])    MS   PONV (postoperative nausea and vomiting)    Skin cancer     PAST SURGICAL HISTORY: Past Surgical History:  Procedure Laterality Date   ABDOMINAL HYSTERECTOMY     BREAST BIOPSY Right 09/20/2012   Procedure: BREAST BIOPSY WITH NEEDLE LOCALIZATION;  Surgeon: Rolm Bookbinder, MD;  Location: [facility];  Service: General;  Laterality: Right;   BREAST EXCISIONAL BIOPSY Right    benign   CESAREAN SECTION     COLONOSCOPY WITH PROPOFOL N/A 07/13/2014   Procedure: COLONOSCOPY WITH PROPOFOL;  Surgeon: Beryle Beams, MD;  Location: [facility];  Service: Endoscopy;  Laterality: N/A;   DEBRIDEMENT SKIN / SQ / MUSCLE OF ARM Right 07/13/2003   I & D; debridement of tissue within triceps muscle   LEG SURGERY     sx as a child   MELANOMA EXCISION     PARTIAL HYSTERECTOMY     TONSILLECTOMY     as a child    FAMILY HISTORY: Family History  Problem Relation Age of Onset   Hyperlipidemia Mother    Hypertension Mother    Hypertension Father    Diabetes type II Father     SOCIAL HISTORY:  Social History   Socioeconomic History   Marital status: Married    Spouse name: Not on file   Number  of children: Not on file   Years of education: Not on file   Highest education level: Not on file  Occupational History   Not on file  Social Needs   Financial resource strain: Not on file   Food insecurity:    Worry: Not on file    Inability: Not on file   Transportation needs:    Medical: Not on file    Non-medical: Not on file  Tobacco Use   Smoking status: Never Smoker   Smokeless tobacco: Never Used  Substance and Sexual Activity   Alcohol use: Not Currently    Comment: social   Drug use: No   Sexual activity: Not on file  Lifestyle   Physical activity:    Days per week: Not on file    Minutes per session: Not on file   Stress: Not on file  Relationships   Social connections:    Talks on phone: Not on file    Gets together: Not on file    Attends religious service: Not on file    Active member of club or organization: Not on file    Attends meetings of clubs or organizations: Not on file    Relationship status: Not on file   Intimate partner violence:    Fear of current or ex partner: Not on file    Emotionally abused: Not on file    Physically abused: Not on file    Forced sexual activity: Not on file  Other Topics Concern   Not on file  Social History Narrative   Lives at home w/ her husband     PHYSICAL EXAM  Vitals:   09/14/18 1309  BP: 105/60  Pulse: 83  SpO2: 98%  Height: 5\' 5"  (1.651 m)    Body mass index is 21.63 kg/m.   General: The patient is well-developed and well-nourished and in no acute distress   Skin/Ext/Musculoskeletal: She has pedal edema.  The feet are erythematous, right greater than left.  Pulses are felt.  She has tenderness over the left splenius capitis and splenius cervicis muscles and also on the side of  the neck.  There is tenderness over the piriformis muscle  Neurologic Exam  Mental status: The patient is alert and oriented x 3 at the time of the examination. The patient has apparent normal recent and  remote memory, with an apparently normal attention span and concentration ability.   Speech is normal.  Cranial nerves: Extraocular movements are full.  Facial strength and sensation was normal.  Trapezius strength is normal.. No dysarthria is noted.  No obvious hearing deficits are noted.  Motor:  Muscle bulk and tone are normal.  Strength is 3/5 in the proximal legs, little weaker on the left.  Strength is 4- to 4/5 distally.  The arms are fairly strong.  Sensory: She has intact sensation to touch and vibration in the arms.  There is mildly reduced touch sensation in the left leg..  Coordination: Cerebellar testing reveals reduced finger-nose-finger and poor heel-to-shin bilaterally.  Gait and station: She can stand up from her scooter but needs to use her arms and legs..  She is unable to take steps without bilateral support.  Reflexes: Deep tendon reflexes are symmetric and increased in legs bilaterally with spread at the knees.  She has non-sustained ankle clonus bilaterally.  Maneuvers: Dix-Hallpike maneuver to the right evoked vertigo without nystagmus.     DIAGNOSTIC DATA (LABS, IMAGING, TESTING) - I reviewed patient records, labs, notes, testing and imaging myself where available.  Lab Results  Component Value Date   WBC 8.9 03/07/2018   HGB 14.0 03/07/2018   HCT 43.0 03/07/2018   MCV 97.1 03/07/2018   PLT 236 03/07/2018      Component Value Date/Time   NA 140 03/07/2018 1138   K 3.9 03/07/2018 1138   CL 101 03/07/2018 1138   CO2 30 03/07/2018 1138   GLUCOSE 104 (H) 03/07/2018 1138   BUN 15 03/07/2018 1138   CREATININE 0.67 03/07/2018 1138   CALCIUM 9.7 03/07/2018 1138   PROT 7.7 03/07/2018 1138   PROT 6.6 10/01/2015 1351   ALBUMIN 4.6 03/07/2018 1138   ALBUMIN 4.5 10/01/2015 1351   AST 21 03/07/2018 1138   ALT 16 03/07/2018 1138   ALKPHOS 62 03/07/2018 1138   BILITOT 0.5 03/07/2018 1138   BILITOT 0.4 10/01/2015 1351   GFRNONAA >60 03/07/2018 1138   GFRAA >60  03/07/2018 1138      ASSESSMENT AND PLAN  Multiple sclerosis (HCC)  Left leg weakness  Vertigo  Chronic fatigue  Depression with anxiety  Neck pain   1.   For now, she would like to remain off of a disease modifying therapy for her MS.  We have discussed [place] as an option if she chooses to go back on a disease modifying therapy.    However we will hold off further consideration 2.   Trigger point injection of the left splenius capitus and splenius cervicus muscle with 80 mg DepoMedrol in [place] using sterile technique.   3.    She will continue her medications for pain and Adderall for her fatigue and attention deficit.     Change Cymbalta to Zoloft 4.   she will return to see me in 4 months or sooner if she has new or worsening neurologic symptoms.   Blaike Vickers A. Felecia Shelling, MD, PhD 4/66/5993, 5:70 PM Certified in Neurology, Clinical Neurophysiology, Sleep Medicine, Pain Medicine and Neuroimaging  [facility] [address] [place], [place] [address] [phone]

## 2018-09-26 ENCOUNTER — Telehealth: Payer: Self-pay | Admitting: Neurology

## 2018-09-26 NOTE — Telephone Encounter (Signed)
Dr. Felecia Shelling- would you like to make any changes?  Called pt back. I reviewed Dr. Garth Bigness last OV note, he documented that he injected: left splenius capitus. I relayed this to pt. She verbalized understanding.  She states her left side of throat hurts and hard for her to swallow. This has been a chronic issue. Recommended her to continue to use heating pad for continued  Neck discomfort. She should continue to monitor other sx and if sx gets worse or if she has new sx she should call back. If she sits in chair and looks down for awhile, sx worsen.   She would like message to be sent to Dr. Felecia Shelling so he is aware. No call back needed if Dr. Felecia Shelling does not want to change treatment plan per pt.

## 2018-09-26 NOTE — Telephone Encounter (Signed)
Pt called wanting to be informed if the injections that she received were administered on the L or the R side of the neck. Please advise.

## 2018-10-24 DIAGNOSIS — R21 Rash and other nonspecific skin eruption: Secondary | ICD-10-CM | POA: Diagnosis not present

## 2018-10-28 DIAGNOSIS — M79605 Pain in left leg: Secondary | ICD-10-CM | POA: Diagnosis not present

## 2018-11-04 DIAGNOSIS — R110 Nausea: Secondary | ICD-10-CM | POA: Diagnosis not present

## 2018-11-04 DIAGNOSIS — M542 Cervicalgia: Secondary | ICD-10-CM | POA: Diagnosis not present

## 2018-11-04 DIAGNOSIS — J029 Acute pharyngitis, unspecified: Secondary | ICD-10-CM | POA: Diagnosis not present

## 2018-11-07 ENCOUNTER — Other Ambulatory Visit: Payer: Self-pay | Admitting: Neurology

## 2018-11-07 MED ORDER — AMPHETAMINE-DEXTROAMPHETAMINE 20 MG PO TABS: 20.0000 mg | ORAL_TABLET | Freq: Two times a day (BID) | ORAL | 0 refills | Status: DC

## 2018-11-07 NOTE — Telephone Encounter (Signed)
Pt called in for a refill of amphetamine-dextroamphetamine (ADDERALL) 20 MG tablet to be sent to [facility] on [address]

## 2018-11-08 ENCOUNTER — Other Ambulatory Visit: Payer: Medicare Other

## 2018-11-08 ENCOUNTER — Ambulatory Visit: Payer: Medicare Other

## 2018-11-08 DIAGNOSIS — F411 Generalized anxiety disorder: Secondary | ICD-10-CM | POA: Diagnosis not present

## 2018-11-08 DIAGNOSIS — G35 Multiple sclerosis: Secondary | ICD-10-CM | POA: Diagnosis not present

## 2018-11-08 DIAGNOSIS — R21 Rash and other nonspecific skin eruption: Secondary | ICD-10-CM | POA: Diagnosis not present

## 2018-11-08 DIAGNOSIS — R5383 Other fatigue: Secondary | ICD-10-CM | POA: Diagnosis not present

## 2018-11-09 ENCOUNTER — Telehealth: Payer: Self-pay | Admitting: Neurology

## 2018-11-09 ENCOUNTER — Other Ambulatory Visit: Payer: Self-pay

## 2018-11-09 ENCOUNTER — Ambulatory Visit (INDEPENDENT_AMBULATORY_CARE_PROVIDER_SITE_OTHER): Payer: Medicare Other | Admitting: Neurology

## 2018-11-09 VITALS — BP 119/74 | HR 73 | Temp 97.1°F

## 2018-11-09 DIAGNOSIS — G35 Multiple sclerosis: Secondary | ICD-10-CM | POA: Diagnosis not present

## 2018-11-09 DIAGNOSIS — M542 Cervicalgia: Secondary | ICD-10-CM

## 2018-11-09 DIAGNOSIS — R5382 Chronic fatigue, unspecified: Secondary | ICD-10-CM

## 2018-11-09 DIAGNOSIS — R260 Ataxic gait: Secondary | ICD-10-CM | POA: Diagnosis not present

## 2018-11-09 DIAGNOSIS — F418 Other specified anxiety disorders: Secondary | ICD-10-CM

## 2018-11-09 NOTE — Telephone Encounter (Signed)
I called pt back. She is having increased neck pain.  She denies any covid-19 symptoms or being in contact with covid-19. I scheduled work in visit at 1:30pm for injection today. Ok per Dr. Felecia Shelling.  She thinks antidepressant is causing her to be sleepy.  She had a spider bite on her ankle that PCP managing. She did virtual visit with them. They placed her on a steroid pack. That was ineffective. She still had pain associated w/ this. They then placed her on antibiotic she could not tolerate (got nauseous). She stopped it. She felt better after stopping. PCP called her yesterday and going to give her a topical cream for this and told her to f/u if sx persist.

## 2018-11-09 NOTE — Progress Notes (Signed)
[facility]  PATIENT: Tonya Powers DOB: 11-28-1960  REFERRING CLINICIAN: Mayra Neer is PCP HISTORY FROM: Patient  REASON FOR VISIT: MS and poor gait   HISTORICAL  CHIEF COMPLAINT:  Chief Complaint  Patient presents with   Follow-up    RM 12. Here for work in appt for injections. Having increased neck pain.   Multiple Sclerosis    SPMS off a DMT    HISTORY OF PRESENT ILLNESS:  Tonya Powers is a 58 y.o. woman with MS.      Update 11/09/2018: She feels the MS is stable with weakness and poor gait.    MRI's of the cervical spine and brain have not shown any new activity though plaque burden is high.  She is reporting a lot of pain in the left side of her neck going to the back of the head and the shoulder region.  In the past, trigger point injections have helped reduce the pain.  Zoloft has helped mood but she does feel more sleepy.  She can take Adderall 20 mg bid but usually just takes once a day and we discussed taking 10 - 20 mg around lunchtime.      Update 09/14/2018: Since the last visit, she had an MRI of the cervical spine due to more weakness and left neck pain.    I personally reviewed the MRI of the cervical spine performed 09/13/2018.   It shows multiple T2/flair hyperintense foci in the spinal cord.  I compared it side-by-side with an MRI of the brain performed 02/28/2016 and there are no definite new lesions.   Several of these are lateral and will be expected to affect strength and spasticity.  She also has some degenerative changes but not severe enough to lead to nerve root compression at any of the levels.  There is no spinal stenosis.  She has leg weakness, left > right, and some spasticity.    Intermittent steroids prn have helped her.   She is no longer on a DMT.  She continues to have skin dysesthesias, worse in the left neck/shoulder and the right hip and down.  She also has pain in the left lateral neck of the jaw.  She is on Nucynta  when pain is worse and tramadol most days  Her anxiety is doing better on duloxetine but it has not helped the pain and it seems to have changed her taste --- everything is sour.     We discussed switching to an SSRI as  Update 03/10/2018: For the past 2 weeks, she has had more vertigo.  The onset was over a day.  Meclizine has not helped.  The sister did an Epley maneuver or other vestibular exercise and noted that there was nystagmus.  Over the past week the vertigo persisted and she went to [facility] 03/07/2018 and received IV Ativan with benefit that day.  Zofran greatly helped the nausea.  Initially, she had significant nausea and also some vomiting.  She continues to experience a lot of pain in the right hip and buttock region.  In the past, piriformis and/or trochanteric bursa injections have helped her for many weeks.  Lamotrigine has helped this pain some but oxcarbazepine did not.  She is not currently on a disease modifying therapy for her MS.  She did not feel that she got any benefit from [place] and also was progressing on other medications.  Her main problem is gait and she has bilateral leg weakness.  She  can only take a few steps with bilateral support.  She mostly uses a scooter.  She has had a fair amount of fatigue and reports attention deficit disorder.  She has had some depression but mood is doing fairly well currently.     Update 01/03/2018: She is noting more difficulty with her painful dysesthesias.  The wrose pain is in her right hip and left > right  legs but she has skin dysesthestic pain from her chest down (not much in arms).    Her feet get very red with swelling as the day goes on.     She is on lamotrigine 200 mg po bid, gabapentin 800 mg po qid and Nucynta (prn only)    She uses her scooter mostly and can go 20 feet or so with her walker but not repeatedly as it wears her out.   She has left > right leg weakness.   Arms are strong.    She has insomnia and takes  clonazepam and doxepin nightly.   She also has anxiety (not depression) and will occ take a valium if this is more of a problem.      Update 04/29/2017: She felt more weakness and vertigo on Ocrelizumab and she has stopped.   She is not currently on any DMT.   She takes steroids IV every month or two and notes improvement of fatigue and general strength. She has left > right leg weakness and spasticity.   She feels gait is worse and she has more trouble lifting the left leg.   With a walker she can go 10-20 feet and she uses a scooter mostly.     She can transfer though this is sometimes difficult and she has had falls.   Ampyra had not helped.    She has dysesthetic pain helped by tramadol, gabapentin and lamotrigine.   She uses Nucynta sparingly.    She still has bouts of severe vertigo.   She does Epley maneuvers when it reoccurs.     She has low back pain that intensifies with prolonged siting.   Solanpas lidocaine patches help some.   Piriformis shots have also helped.    She has fatigue daily.  Adderall has greatly helped her fatigue and poor focus.  She has trouble getting comfortable at night according insomnia.  Ambien, clonazepam and doxepin help her sleep but without one of them, sleep is worse.        From 02/02/2017:   Vertigo:    She has had vertigo x 15 days.   She has been on meclizine, phenergan, Medrol dosepak, clonazepam (not sure if helped but sleeps better)and IV Solu-Medrol.  Dramamine may have helped slightly.   She has vertigo while still and has diplopia.    Initially she also had N/V.  The diplopia is worse with movements.     She went to the ED and had a Morphine shot when pain was worse and also had IV fluids.     MS:   She is back on the Ocrevus.   She thinks her MS is probably stable but is concerned that the vertigo might be a new exacerbation.Marland Kitchen    She was on Tysabri and was stable but converted to JCV Ab positive and stopped.   She could not tolerate Tecfidera.         Gait/strength :    Her gait is very poor and she mostly uses a scooter. She can use a walker for a  few steps (probably up to 20 feet).     She has left > right leg weakness and spasticity.       Dysesthesia/hip pain:   She continues to report burning pain in the right leg and right flank lamotrigine has only helps the pain a little bit.    Tegretol and gabapentin have not helped much.   TCA's have not helped.     Baclofen made her feel weaker and she stopped.     Nucynta is too expensive.   Tramadol has not helped much   Bladder/bowel:  She has a difficult bladder dysfunction with frequent incontinence. She has quite a bit of hesitancy, worse when she uses the opiates (now off of these).    Tamsulosin did not really help the hesitancy. Desmopressin helps her to the bed at night to help her more. She still has frequent incontinence.     She reports constipation. Linzess did not help.    Nucynta does not worsen constipation like opiates did but is very expensive.  Fatigue/sleep:   She has fatigue most days, worse with heat. Adderall has helped.   She does well doing pool exercises.,  When fatiue is more severe she takes  Solu-Medrol infusions.   She has both sleep onset and sleep maintenance insomnia.   She takes clonazepam 1 mg and Ambien every night due to difficulty falling and staying asleep. Clonazepam also helps her leg spasms as well as helps her to fall asleep.   Initially, does note progressive help her sleep but now she is having a lot of incontinence again for   Mood/cognition:   She notes mild depression and anxiety.    She prefers not to take an antidepressant.    She rarely takes Xanax 0.5 mg when she has more anxiety. She notes more trouble with cognition, especially short term memory.    Adderall helps focus some    MS History:  She presented with optic neuritis in 1991 followed shortly by difficulties with leg weakness and by right trigeminal neuralgia. In retrospect, couple years  earlier when she had her daughter, has some difficulties with her legs and needed to go on short-term disability. At first she was not diagnosed with MS but after more of the symptoms she underwent MRI testing and had a lumbar puncture by Dr. Johnnye Sima. The imaging and the CSF was consistent with multiple sclerosis. When Betaseron became available she was placed on that. She was on Betaseron for about 10 years. She felt that she did not have too many exacerbations during that time but she had a lot of difficulty tolerating the Betaseron due to skin reactions. Around 12-15 years ago, she started to use a cane and she has had progressive gait disturbance over the last decade. Around the house for short distances, she uses a walker but uses her scooter for longer distances. Outside she uses a wheelchair pushed by others   REVIEW OF SYSTEMS:  Constitutional: No fevers, chills, sweats, or change in appetite.  She has Fatigue Eyes: No visual changes, double vision, eye pain Ear, nose and throat: No hearing loss, ear pain, nasal congestion, sore throat Cardiovascular: No chest pain, palpitations Respiratory:  No shortness of breath at rest or with exertion.   No wheezes GastrointestinaI: Mild dysphagia.  Constipation.  No nausea, vomiting, diarrhea.   Genitourinary:  No dysuria but has urinary retention and frequency.  Desmopressin helps nocturia. Musculoskeletal:  No neck pain,   Has lower back pain and  buttocks.  Hips ans other joints ok Integumentary: No rash, pruritus, skin lesions Neurological: as above Psychiatric: see above Endocrine: No palpitations, diaphoresis, change in appetite, change in weigh or increased thirst Hematologic/Lymphatic:  No anemia, purpura, petechiae. Allergic/Immunologic: No itchy/runny eyes, nasal congestion, recent allergic reactions, rashes  ALLERGIES: Allergies  Allergen Reactions   Codeine Nausea And Vomiting    HOME MEDICATIONS: Outpatient Medications Prior to  Visit  Medication Sig Dispense Refill   acetaminophen (TYLENOL) 500 MG tablet Take 1,000 mg by mouth daily as needed for pain.     ALPRAZolam (XANAX) 0.5 MG tablet Take 0.5 mg by mouth daily as needed for anxiety.      amphetamine-dextroamphetamine (ADDERALL) 20 MG tablet Take 1 tablet (20 mg total) by mouth 2 (two) times daily. 60 tablet 0   Cholecalciferol (VITAMIN D PO) Take 1 tablet by mouth daily.     clonazePAM (KLONOPIN) 1 MG tablet Take 1 tablet (1 mg total) by mouth at bedtime as needed. 30 tablet 3   desmopressin (DDAVP) 0.1 MG tablet Take 1 tablet (0.1 mg total) by mouth at bedtime as needed (bladder.). 30 tablet 11   diazepam (VALIUM) 5 MG tablet Take 1 tablet (5 mg total) by mouth every 8 (eight) hours as needed. 90 tablet 5   estradiol (CLIMARA - DOSED IN MG/24 HR) 0.1 mg/24hr patch Place 0.1 mg onto the skin once a week.  5   gabapentin (NEURONTIN) 800 MG tablet TAKE 1 TABLET(800 MG) BY MOUTH FOUR TIMES DAILY (Patient taking differently: Take 800 mg by mouth 3 (three) times daily. ) 120 tablet 11   ibuprofen (ADVIL,MOTRIN) 200 MG tablet Take 400 mg by mouth every 6 (six) hours as needed for headache (headache).     lamoTRIgine (LAMICTAL) 200 MG tablet Take 1 tablet (200 mg total) by mouth 3 (three) times daily. 270 tablet 1   lidocaine (LIDODERM) 5 % Place 1 patch daily onto the skin. Remove & Discard patch within 12 hours or as directed by MD 30 patch 11   meclizine (ANTIVERT) 25 MG tablet Take 1 tablet (25 mg total) by mouth 3 (three) times daily as needed for dizziness. 30 tablet 0   methylPREDNISolone (MEDROL DOSEPAK) 4 MG TBPK tablet Take 6 tablets on day 1 and decrease by 1 tablet each day until finished 21 tablet 0   nitrofurantoin, macrocrystal-monohydrate, (MACROBID) 100 MG capsule Take 1 capsule (100 mg total) by mouth 2 (two) times daily. 14 capsule 0   ondansetron (ZOFRAN) 4 MG tablet Take 1 tablet (4 mg total) by mouth every 8 (eight) hours as needed for  nausea or vomiting. 21 tablet 3   promethazine (PHENERGAN) 25 MG tablet Take 1 tablet (25 mg total) by mouth every 6 (six) hours as needed for nausea (nausea). 30 tablet 3   sertraline (ZOLOFT) 50 MG tablet Take 1 tablet (50 mg total) by mouth daily. 30 tablet 11   tapentadol (NUCYNTA) 50 MG tablet Take 1 tablet (50 mg total) by mouth 3 (three) times daily as needed for moderate pain or severe pain (pain). 90 tablet 0   traMADol (ULTRAM) 50 MG tablet TAKE 1 TABLET BY MOUTH THREE TIMES DAILY AS NEEDED FOR MODERATE PAIN 90 tablet 5   zolpidem (AMBIEN) 10 MG tablet Take 1 tablet (10 mg total) by mouth at bedtime as needed. for sleep 30 tablet 5   No facility-administered medications prior to visit.     PAST MEDICAL HISTORY: Past Medical History:  Diagnosis Date  Anxiety    Depression    Gait instability    uses wheelchair or assistance   H/O steroid therapy    IV infusion every 6 to 8 weeks- last 05-31-14 [facility]   History of breast biopsy    Multiple sclerosis ([place])    Neuromuscular disorder ([place])    MS   PONV (postoperative nausea and vomiting)    Skin cancer     PAST SURGICAL HISTORY: Past Surgical History:  Procedure Laterality Date   ABDOMINAL HYSTERECTOMY     BREAST BIOPSY Right 09/20/2012   Procedure: BREAST BIOPSY WITH NEEDLE LOCALIZATION;  Surgeon: Rolm Bookbinder, MD;  Location: [place];  Service: General;  Laterality: Right;   BREAST EXCISIONAL BIOPSY Right    benign   CESAREAN SECTION     COLONOSCOPY WITH PROPOFOL N/A 07/13/2014   Procedure: COLONOSCOPY WITH PROPOFOL;  Surgeon: Beryle Beams, MD;  Location: [facility];  Service: Endoscopy;  Laterality: N/A;   DEBRIDEMENT SKIN / SQ / MUSCLE OF ARM Right 07/13/2003   I & D; debridement of tissue within triceps muscle   LEG SURGERY     sx as a child   MELANOMA EXCISION     PARTIAL HYSTERECTOMY     TONSILLECTOMY     as a child    FAMILY HISTORY: Family  History  Problem Relation Age of Onset   Hyperlipidemia Mother    Hypertension Mother    Hypertension Father    Diabetes type II Father     SOCIAL HISTORY:  Social History   Socioeconomic History   Marital status: Married    Spouse name: Not on file   Number of children: Not on file   Years of education: Not on file   Highest education level: Not on file  Occupational History   Not on file  Social Needs   Financial resource strain: Not on file   Food insecurity:    Worry: Not on file    Inability: Not on file   Transportation needs:    Medical: Not on file    Non-medical: Not on file  Tobacco Use   Smoking status: Never Smoker   Smokeless tobacco: Never Used  Substance and Sexual Activity   Alcohol use: Not Currently    Comment: social   Drug use: No   Sexual activity: Not on file  Lifestyle   Physical activity:    Days per week: Not on file    Minutes per session: Not on file   Stress: Not on file  Relationships   Social connections:    Talks on phone: Not on file    Gets together: Not on file    Attends religious service: Not on file    Active member of club or organization: Not on file    Attends meetings of clubs or organizations: Not on file    Relationship status: Not on file   Intimate partner violence:    Fear of current or ex partner: Not on file    Emotionally abused: Not on file    Physically abused: Not on file    Forced sexual activity: Not on file  Other Topics Concern   Not on file  Social History Narrative   Lives at home w/ her husband     PHYSICAL EXAM  Vitals:   11/09/18 1331  BP: 119/74  Pulse: 73  Temp: (!) 97.1 F (36.2 C)    There is no height or weight on file to  calculate BMI.   General: The patient is well-developed and well-nourished and in no acute distress   Skin/Ext/Musculoskeletal: She has pedal edema.   She has tenderness over the left splenius capitis and splenius cervicis muscles and to  a lesser extent the trapezius and rhomboid muscles   Neurologic Exam  Mental status: The patient is alert and oriented x 3 at the time of the examination. The patient has apparent normal recent and remote memory, with an apparently normal attention span and concentration ability.   Speech is normal.  Cranial nerves: Extraocular movements are full.  Facial strength and sensation was normal.  Trapezius strength is normal.. No dysarthria is noted.  No obvious hearing deficits are noted.  Motor:  Muscle bulk and tone are normal.  Strength is 3/5 in the proximal legs, little weaker on the left.  Strength is 4- to 4/5 distally.  The arms are fairly strong.  Sensory: She has reduced touch sensation in the left leg..  Coordination: Cerebellar testing reveals reduced finger-nose-finger and poor heel-to-shin bilaterally.  Gait and station: Not tested today.  Reflexes: Deep tendon reflexes are symmetric and increased in legs bilaterally with spread at the knees.  She has non-sustained ankle clonus bilaterally.    DIAGNOSTIC DATA (LABS, IMAGING, TESTING) - I reviewed patient records, labs, notes, testing and imaging myself where available.  Lab Results  Component Value Date   WBC 8.9 03/07/2018   HGB 14.0 03/07/2018   HCT 43.0 03/07/2018   MCV 97.1 03/07/2018   PLT 236 03/07/2018      Component Value Date/Time   NA 140 03/07/2018 1138   K 3.9 03/07/2018 1138   CL 101 03/07/2018 1138   CO2 30 03/07/2018 1138   GLUCOSE 104 (H) 03/07/2018 1138   BUN 15 03/07/2018 1138   CREATININE 0.67 03/07/2018 1138   CALCIUM 9.7 03/07/2018 1138   PROT 7.7 03/07/2018 1138   PROT 6.6 10/01/2015 1351   ALBUMIN 4.6 03/07/2018 1138   ALBUMIN 4.5 10/01/2015 1351   AST 21 03/07/2018 1138   ALT 16 03/07/2018 1138   ALKPHOS 62 03/07/2018 1138   BILITOT 0.5 03/07/2018 1138   BILITOT 0.4 10/01/2015 1351   GFRNONAA >60 03/07/2018 1138   GFRAA >60 03/07/2018 1138      ASSESSMENT AND PLAN  Multiple  sclerosis (HCC)  Ataxic gait  Neck pain  Chronic fatigue  Depression with anxiety   1.   She will remain off of a disease modifying therapy for her secondary progressive MS.  We had discussed options in the past.   2.   Trigger point injection of the left splenius capitus, splenius cervicus, trapezius and rhomboid muscles with 80 mg DepoMedrol in [place] using sterile technique.   3.    Continue Zoloft for depression and Adderall for fatigue, focus/attention.  She can take the Adderall twice a day. 4.   she will return to see me in 4 months or sooner if she has new or worsening neurologic symptoms.   Javeion Cannedy A. Felecia Shelling, MD, PhD 02/25/5620, 3:08 PM Certified in Neurology, Clinical Neurophysiology, Sleep Medicine, Pain Medicine and Neuroimaging  [facility] [address] [place], [place] [address] [phone]

## 2018-11-09 NOTE — Telephone Encounter (Signed)
Pt called stating that she is having a lot of pain in her neck and she is wanting to know if she can come in for some injections to relieve the pain. Please advise.

## 2018-11-29 ENCOUNTER — Other Ambulatory Visit: Payer: Self-pay | Admitting: Neurology

## 2019-01-02 ENCOUNTER — Other Ambulatory Visit: Payer: Self-pay

## 2019-01-02 ENCOUNTER — Ambulatory Visit
Admission: RE | Admit: 2019-01-02 | Discharge: 2019-01-02 | Disposition: A | Discharge status: Home / Self Care | Payer: Medicare Other | Source: Ambulatory Visit | Attending: Family Medicine | Admitting: Family Medicine

## 2019-01-02 ENCOUNTER — Ambulatory Visit: Payer: Medicare Other

## 2019-01-02 ENCOUNTER — Telehealth: Payer: Self-pay

## 2019-01-02 DIAGNOSIS — M8589 Other specified disorders of bone density and structure, multiple sites: Secondary | ICD-10-CM | POA: Diagnosis not present

## 2019-01-02 DIAGNOSIS — M8580 Other specified disorders of bone density and structure, unspecified site: Secondary | ICD-10-CM

## 2019-01-02 DIAGNOSIS — Z1231 Encounter for screening mammogram for malignant neoplasm of breast: Secondary | ICD-10-CM

## 2019-01-02 DIAGNOSIS — Z780 Asymptomatic menopausal state: Secondary | ICD-10-CM | POA: Diagnosis not present

## 2019-01-02 MED ORDER — AMPHETAMINE-DEXTROAMPHETAMINE 20 MG PO TABS: 20.0000 mg | ORAL_TABLET | Freq: Two times a day (BID) | ORAL | 0 refills | Status: DC

## 2019-01-02 NOTE — Addendum Note (Signed)
Addended by: Britt Bottom on: 01/02/2019 09:55 AM   Modules accepted: Orders

## 2019-01-02 NOTE — Telephone Encounter (Signed)
Patient is calling for a refill on her Adderall 20mg . Pharmacy confirmed. Last OV 11/09/18.

## 2019-01-09 ENCOUNTER — Telehealth: Payer: Self-pay | Admitting: Neurology

## 2019-01-09 DIAGNOSIS — G35 Multiple sclerosis: Secondary | ICD-10-CM

## 2019-01-09 DIAGNOSIS — R39198 Other difficulties with micturition: Secondary | ICD-10-CM

## 2019-01-09 NOTE — Telephone Encounter (Signed)
Pt called in and stated she needs a script sent to [facility] in order for her to get her Catheters.

## 2019-01-09 NOTE — Telephone Encounter (Signed)
Faxed script to number below. Received fax confirmation.

## 2019-01-09 NOTE — Telephone Encounter (Signed)
I called [facility]. Spoke with [employer]. Fax: [phone]. Printed script, waiting on MD signature and then will fax

## 2019-01-09 NOTE — Telephone Encounter (Signed)
Spoke with Dr. Felecia Shelling. Ok to provide script.

## 2019-01-12 DIAGNOSIS — E7800 Pure hypercholesterolemia, unspecified: Secondary | ICD-10-CM | POA: Diagnosis not present

## 2019-01-18 DIAGNOSIS — G35 Multiple sclerosis: Secondary | ICD-10-CM | POA: Diagnosis not present

## 2019-01-18 DIAGNOSIS — F411 Generalized anxiety disorder: Secondary | ICD-10-CM | POA: Diagnosis not present

## 2019-01-18 DIAGNOSIS — K219 Gastro-esophageal reflux disease without esophagitis: Secondary | ICD-10-CM | POA: Diagnosis not present

## 2019-01-18 DIAGNOSIS — E7800 Pure hypercholesterolemia, unspecified: Secondary | ICD-10-CM | POA: Diagnosis not present

## 2019-01-30 ENCOUNTER — Telehealth: Payer: Self-pay | Admitting: Neurology

## 2019-01-30 ENCOUNTER — Encounter: Payer: Self-pay | Admitting: *Deleted

## 2019-01-30 MED ORDER — DICLOFENAC SODIUM 75 MG PO TBEC: 75.0000 mg | DELAYED_RELEASE_TABLET | Freq: Two times a day (BID) | ORAL | 3 refills | Status: DC

## 2019-01-30 MED ORDER — TRAMADOL HCL 50 MG PO TABS: ORAL_TABLET | ORAL | 5 refills | Status: DC

## 2019-01-30 NOTE — Telephone Encounter (Signed)
Pt would like RN to call her back to discuss her Neck pain that is not getting better. Pt would like you to call her back on the home number but if it does not go through due to the weather then please try the [place] line [phone].

## 2019-01-30 NOTE — Telephone Encounter (Signed)
Called, LVM for pt returning her call. 

## 2019-01-30 NOTE — Telephone Encounter (Signed)
Called and spoke with pt. She is agreeable to this plan. Escribed rx diclofenac. She just picked up rx tramadol and will use of what she has taking QID.

## 2019-01-30 NOTE — Telephone Encounter (Signed)
Pt called office back. Took call from phone staff. She reports worsening neck/shoulder/arm pain. Last trigger point injections did not help much. She has been alternating between tylenol, ibuprofen, naproxen. Has been using heat/cold. Pain worse on left side of neck and sometimes radiates to the right.   She has also noticed decreased strength in legs. She is applying for a grant and also needs letter stating she has MS and will pick up letter. I printed, waiting on MD signature.   I offered to make appt for her to see Dr. Felecia Shelling but she declined and wanted to see what Dr. Felecia Shelling recommends over the phone first. Advised I will call back.

## 2019-01-30 NOTE — Telephone Encounter (Signed)
We can increase the tramadol to 4 times a day.  We can change the ibuprofen to diclofenac 400 mg milligrams p.o. twice daily.   #60 #3

## 2019-02-08 ENCOUNTER — Telehealth: Payer: Self-pay | Admitting: Neurology

## 2019-02-08 DIAGNOSIS — G35 Multiple sclerosis: Secondary | ICD-10-CM | POA: Diagnosis not present

## 2019-02-08 NOTE — Telephone Encounter (Signed)
Pt would like to come in for an infusion today, please call

## 2019-02-08 NOTE — Telephone Encounter (Signed)
I called pt back. Her left leg has gotten more numb, now having numbness to the top of her left leg. Also has pain with this. Tried heat/ice and ineffective. Spoke with infusion suite and they can fit pt in this afternoon. Pt will come at 1pm. Provided signed orders to them for 1G IV solumedrol x1day.   Phone: [phone]

## 2019-02-14 ENCOUNTER — Ambulatory Visit: Payer: Medicare Other | Admitting: Neurology

## 2019-02-21 ENCOUNTER — Other Ambulatory Visit: Payer: Self-pay | Admitting: Neurology

## 2019-02-22 ENCOUNTER — Other Ambulatory Visit: Payer: Self-pay | Admitting: Neurology

## 2019-02-22 MED ORDER — AMPHETAMINE-DEXTROAMPHETAMINE 20 MG PO TABS: 20.0000 mg | ORAL_TABLET | Freq: Two times a day (BID) | ORAL | 0 refills | Status: DC

## 2019-02-22 NOTE — Telephone Encounter (Signed)
Pt is needing a refill on her amphetamine-dextroamphetamine (ADDERALL) 20 MG tablet sent to the [facility] on [address]

## 2019-02-24 ENCOUNTER — Other Ambulatory Visit: Payer: Self-pay | Admitting: Neurology

## 2019-02-28 DIAGNOSIS — Z0120 Encounter for dental examination and cleaning without abnormal findings: Secondary | ICD-10-CM | POA: Diagnosis not present

## 2019-03-07 DIAGNOSIS — K219 Gastro-esophageal reflux disease without esophagitis: Secondary | ICD-10-CM | POA: Diagnosis not present

## 2019-03-07 DIAGNOSIS — K5900 Constipation, unspecified: Secondary | ICD-10-CM | POA: Diagnosis not present

## 2019-03-07 DIAGNOSIS — R1310 Dysphagia, unspecified: Secondary | ICD-10-CM | POA: Diagnosis not present

## 2019-03-08 ENCOUNTER — Telehealth: Payer: Self-pay | Admitting: Neurology

## 2019-03-08 NOTE — Telephone Encounter (Signed)
Called and spoke with pt.  Pt is getting ready to go to the beach 03/18/19. She is having increased pain in her feet on the bottom. This is waking her up at night. Denies any increase in fatigue. Patient does not feel she has UTI. She has been drinking plenty of fluids. Pt states her vision has worsened a little with far sighted vision.  Spoke with Dr. Krista Blue, she is ok to bring pt in for IV solumedrol 1G x1 day. Spoke with [facility] and they can fit her in tomorrow at 2pm. I gave completed/signed orders to [facility]. Pt agreeable to this.

## 2019-03-08 NOTE — Telephone Encounter (Signed)
Pt called wanting to know if she can get an infusion. Please advise.

## 2019-03-09 DIAGNOSIS — G35 Multiple sclerosis: Secondary | ICD-10-CM | POA: Diagnosis not present

## 2019-03-10 ENCOUNTER — Other Ambulatory Visit: Payer: Self-pay | Admitting: Gastroenterology

## 2019-03-10 ENCOUNTER — Other Ambulatory Visit: Payer: Self-pay | Admitting: Family Medicine

## 2019-03-10 DIAGNOSIS — R1310 Dysphagia, unspecified: Secondary | ICD-10-CM

## 2019-03-10 DIAGNOSIS — K219 Gastro-esophageal reflux disease without esophagitis: Secondary | ICD-10-CM

## 2019-03-14 ENCOUNTER — Telehealth: Payer: Self-pay | Admitting: Neurology

## 2019-03-14 NOTE — Telephone Encounter (Signed)
She may feel better with the diazepam if spasms are contributing to her pain.  If there is enough edema that can also cause pain.  She can get Ted type stockings at the drugstore.

## 2019-03-14 NOTE — Telephone Encounter (Signed)
Pt called stating she is having a lot of pain in her L big toe and the ball of the L foot. Pt would like RN to call her back to discuss this.

## 2019-03-14 NOTE — Telephone Encounter (Signed)
Dr. Felecia Shelling- is there anything else you would recommend? Pt is asking  Called pt back.  Her foot is normally swollen but her left foot is swollen more than normal in the mornings. She cannot put a shoe on right now. She is having increased pain in her feet which has been ongoing. She came last Thursday for IV solumedrol. Advised pt to try taking a diazepam and to do stretching exercises. She has not taking any diazepam today. I did relay she should contact PCP for recommendation, may need to see podiatry per Dr. Felecia Shelling. She verbalized understanding.

## 2019-03-15 NOTE — Telephone Encounter (Signed)
Called and spoke with pt. Relayed Dr. Garth Bigness recommendation. She did not take diazepam as recommended yesterday. States her pain is gone today. She cannot tolerate ted hose, has them at home but cannot wear. I advised her to f/u with PCP on edema if she has worsening sx to explore other options. She verbalized understanding. She tried to elevate her legs as much as she can.

## 2019-03-22 ENCOUNTER — Ambulatory Visit: Payer: Medicare Other | Admitting: Neurology

## 2019-03-27 ENCOUNTER — Ambulatory Visit
Admission: RE | Admit: 2019-03-27 | Discharge: 2019-03-27 | Disposition: A | Discharge status: Home / Self Care | Payer: Medicare Other | Source: Ambulatory Visit | Attending: Gastroenterology | Admitting: Gastroenterology

## 2019-03-27 ENCOUNTER — Other Ambulatory Visit: Payer: Self-pay | Admitting: Gastroenterology

## 2019-03-27 DIAGNOSIS — R1310 Dysphagia, unspecified: Secondary | ICD-10-CM

## 2019-03-27 DIAGNOSIS — K224 Dyskinesia of esophagus: Secondary | ICD-10-CM | POA: Diagnosis not present

## 2019-03-27 DIAGNOSIS — K219 Gastro-esophageal reflux disease without esophagitis: Secondary | ICD-10-CM

## 2019-03-27 DIAGNOSIS — K209 Esophagitis, unspecified: Secondary | ICD-10-CM | POA: Diagnosis not present

## 2019-03-28 ENCOUNTER — Encounter: Payer: Self-pay | Admitting: Neurology

## 2019-03-28 ENCOUNTER — Other Ambulatory Visit: Payer: Self-pay

## 2019-03-28 ENCOUNTER — Ambulatory Visit: Payer: Medicare Other | Admitting: Neurology

## 2019-03-28 VITALS — BP 110/70 | HR 99 | Ht 65.0 in

## 2019-03-28 DIAGNOSIS — M5431 Sciatica, right side: Secondary | ICD-10-CM | POA: Diagnosis not present

## 2019-03-28 DIAGNOSIS — G35 Multiple sclerosis: Secondary | ICD-10-CM

## 2019-03-28 DIAGNOSIS — G35D Multiple sclerosis, unspecified: Secondary | ICD-10-CM

## 2019-03-28 DIAGNOSIS — F5104 Psychophysiologic insomnia: Secondary | ICD-10-CM

## 2019-03-28 DIAGNOSIS — R208 Other disturbances of skin sensation: Secondary | ICD-10-CM

## 2019-03-28 DIAGNOSIS — R600 Localized edema: Secondary | ICD-10-CM

## 2019-03-28 DIAGNOSIS — R29898 Other symptoms and signs involving the musculoskeletal system: Secondary | ICD-10-CM | POA: Diagnosis not present

## 2019-03-28 DIAGNOSIS — H8111 Benign paroxysmal vertigo, right ear: Secondary | ICD-10-CM | POA: Diagnosis not present

## 2019-03-28 DIAGNOSIS — R39198 Other difficulties with micturition: Secondary | ICD-10-CM

## 2019-03-28 DIAGNOSIS — R260 Ataxic gait: Secondary | ICD-10-CM

## 2019-03-28 MED ORDER — CLONAZEPAM 1 MG PO TABS: 1.0000 mg | ORAL_TABLET | Freq: Every evening | ORAL | 3 refills | Status: DC | PRN

## 2019-03-28 MED ORDER — AMPHETAMINE-DEXTROAMPHETAMINE 20 MG PO TABS: 20.0000 mg | ORAL_TABLET | Freq: Two times a day (BID) | ORAL | 0 refills | Status: DC

## 2019-03-28 NOTE — Progress Notes (Signed)
[facility]  PATIENT: Tonya Powers DOB: [birth year]  REFERRING CLINICIAN: Mayra Neer is PCP HISTORY FROM: Patient  REASON FOR VISIT: MS and poor gait   HISTORICAL  CHIEF COMPLAINT:  Chief Complaint  Patient presents with  . Follow-up    RM 13, alone. Last seen 09/14/2018.   . Multiple Sclerosis    Not on DMT   . Fatigue    Takes adderall. still helping. She needs a refill.   . Anxiety    Takes cymbalta  . Gait Problem    In electic scooter. Several falls since seen last. No significant injuries.     HISTORY OF PRESENT ILLNESS:  Tonya Powers is a 58 y.o. woman with MS.      Update 03/28/19: She feels she is doing about the same.   She feels her gait is continuing to worsen.   The legs are weak and spastic, left > right.   She notes clonus in the legs in the morning and sometimes has phasic spasms in the right leg.   She uses a scooter mostly and has some falls with transfers.   No injuries.  She has dysesthesias and only has some benefit from lidocaine patches and tramadol.  Lamotrigine 200 mg po tid and gabapentin 1200 mg tid used to help but less recently.  Pain is worse when clothes rubs against her skin.  Tegretol and tricyclics had not helped.   She feels her sciatica type pain is better this visit than most visits.  Vertigo is doing better.  She occasionlly takes valium (just one a day at most).  She takes zolpidem nightly and clonazepam at bedtime rarely.   In the past, she was on baclofen but stopped because she was sleepy on it.     She has some depression and anxiety and gets frustrated with her mobility issues and not being able to do much with her grandchild.  .  She is on sertraline with some benefit.  Adderall helps her fatigue and focus/attention deficit.     She has swelling, pain and coldness in her toes.   I checked ESR, cryoglobulin, ANCA and ANA for possible Raynauds--- they were fine.    Update 11/09/2018: She feels the MS is  stable with weakness and poor gait.    MRI's of the cervical spine and brain have not shown any new activity though plaque burden is high.  She is reporting a lot of pain in the left side of her neck going to the back of the head and the shoulder region.  In the past, trigger point injections have helped reduce the pain.  Zoloft has helped mood but she does feel more sleepy.  She can take Adderall 20 mg bid but usually just takes once a day and we discussed taking 10 - 20 mg around lunchtime.      Update 09/14/2018: Since the last visit, she had an MRI of the cervical spine due to more weakness and left neck pain.    I personally reviewed the MRI of the cervical spine performed 09/13/2018.   It shows multiple T2/flair hyperintense foci in the spinal cord.  I compared it side-by-side with an MRI of the brain performed 02/28/2016 and there are no definite new lesions.   Several of these are lateral and will be expected to affect strength and spasticity.  She also has some degenerative changes but not severe enough to lead to nerve root compression at any of the levels.  There is no spinal stenosis.  She has leg weakness, left > right, and some spasticity.    Intermittent steroids prn have helped her.   She is no longer on a DMT.  She continues to have skin dysesthesias, worse in the left neck/shoulder and the right hip and down.  She also has pain in the left lateral neck of the jaw.  She is on Nucynta when pain is worse and tramadol most days  Her anxiety is doing better on duloxetine but it has not helped the pain and it seems to have changed her taste --- everything is sour.     We discussed switching to an SSRI as  Update 03/10/2018: For the past 2 weeks, she has had more vertigo.  The onset was over a day.  Meclizine has not helped.  The sister did an Epley maneuver or other vestibular exercise and noted that there was nystagmus.  Over the past week the vertigo persisted and she went to [facility]  03/07/2018 and received IV Ativan with benefit that day.  Zofran greatly helped the nausea.  Initially, she had significant nausea and also some vomiting.  She continues to experience a lot of pain in the right hip and buttock region.  In the past, piriformis and/or trochanteric bursa injections have helped her for many weeks.  Lamotrigine has helped this pain some but oxcarbazepine did not.  She is not currently on a disease modifying therapy for her MS.  She did not feel that she got any benefit from [place] and also was progressing on other medications.  Her main problem is gait and she has bilateral leg weakness.  She can only take a few steps with bilateral support.  She mostly uses a scooter.  She has had a fair amount of fatigue and reports attention deficit disorder.  She has had some depression but mood is doing fairly well currently.     Update 01/03/2018: She is noting more difficulty with her painful dysesthesias.  The wrose pain is in her right hip and left > right  legs but she has skin dysesthestic pain from her chest down (not much in arms).    Her feet get very red with swelling as the day goes on.     She is on lamotrigine 200 mg po bid, gabapentin 800 mg po qid and Nucynta (prn only)    She uses her scooter mostly and can go 20 feet or so with her walker but not repeatedly as it wears her out.   She has left > right leg weakness.   Arms are strong.    She has insomnia and takes clonazepam and doxepin nightly.   She also has anxiety (not depression) and will occ take a valium if this is more of a problem.      Update 04/29/2017: She felt more weakness and vertigo on Ocrelizumab and she has stopped.   She is not currently on any DMT.   She takes steroids IV every month or two and notes improvement of fatigue and general strength. She has left > right leg weakness and spasticity.   She feels gait is worse and she has more trouble lifting the left leg.   With a walker she can go 10-20 feet  and she uses a scooter mostly.     She can transfer though this is sometimes difficult and she has had falls.   Ampyra had not helped.    She has dysesthetic pain helped  by tramadol, gabapentin and lamotrigine.   She uses Nucynta sparingly.    She still has bouts of severe vertigo.   She does Epley maneuvers when it reoccurs.     She has low back pain that intensifies with prolonged siting.   Solanpas lidocaine patches help some.   Piriformis shots have also helped.    She has fatigue daily.  Adderall has greatly helped her fatigue and poor focus.  She has trouble getting comfortable at night according insomnia.  Ambien, clonazepam and doxepin help her sleep but without one of them, sleep is worse.        From 02/02/2017:   Vertigo:    She has had vertigo x 15 days.   She has been on meclizine, phenergan, Medrol dosepak, clonazepam (not sure if helped but sleeps better)and IV Solu-Medrol.  Dramamine may have helped slightly.   She has vertigo while still and has diplopia.    Initially she also had N/V.  The diplopia is worse with movements.     She went to the ED and had a Morphine shot when pain was worse and also had IV fluids.     MS:   She is back on the Ocrevus.   She thinks her MS is probably stable but is concerned that the vertigo might be a new exacerbation.Marland Kitchen    She was on Tysabri and was stable but converted to JCV Ab positive and stopped.   She could not tolerate Tecfidera.        Gait/strength :    Her gait is very poor and she mostly uses a scooter. She can use a walker for a few steps (probably up to 20 feet).     She has left > right leg weakness and spasticity.       Dysesthesia/hip pain:   She continues to report burning pain in the right leg and right flank lamotrigine has only helps the pain a little bit.    Tegretol and gabapentin have not helped much.   TCA's have not helped.     Baclofen made her feel weaker and she stopped.     Nucynta is too expensive.   Tramadol has not helped  much   Bladder/bowel:  She has a difficult bladder dysfunction with frequent incontinence. She has quite a bit of hesitancy, worse when she uses the opiates (now off of these).    Tamsulosin did not really help the hesitancy. Desmopressin helps her to the bed at night to help her more. She still has frequent incontinence.     She reports constipation. Linzess did not help.    Nucynta does not worsen constipation like opiates did but is very expensive.  Fatigue/sleep:   She has fatigue most days, worse with heat. Adderall has helped.   She does well doing pool exercises.,  When fatiue is more severe she takes  Solu-Medrol infusions.   She has both sleep onset and sleep maintenance insomnia.   She takes clonazepam 1 mg and Ambien every night due to difficulty falling and staying asleep. Clonazepam also helps her leg spasms as well as helps her to fall asleep.   Initially, does note progressive help her sleep but now she is having a lot of incontinence again for   Mood/cognition:   She notes mild depression and anxiety.    She prefers not to take an antidepressant.    She rarely takes Xanax 0.5 mg when she has more anxiety. She notes more trouble  with cognition, especially short term memory.    Adderall helps focus some    MS History:  She presented with optic neuritis in 1991 followed shortly by difficulties with leg weakness and by right trigeminal neuralgia. In retrospect, couple years earlier when she had her daughter, has some difficulties with her legs and needed to go on short-term disability. At first she was not diagnosed with MS but after more of the symptoms she underwent MRI testing and had a lumbar puncture by Dr. Johnnye Sima. The imaging and the CSF was consistent with multiple sclerosis. When Betaseron became available she was placed on that. She was on Betaseron for about 10 years. She felt that she did not have too many exacerbations during that time but she had a lot of difficulty tolerating the  Betaseron due to skin reactions. Around 12-15 years ago, she started to use a cane and she has had progressive gait disturbance over the last decade. Around the house for short distances, she uses a walker but uses her scooter for longer distances. Outside she uses a wheelchair pushed by others.    REVIEW OF SYSTEMS:  Constitutional: No fevers, chills, sweats, or change in appetite.  She has Fatigue Eyes: No visual changes, double vision, eye pain Ear, nose and throat: No hearing loss, ear pain, nasal congestion, sore throat Cardiovascular: No chest pain, palpitations Respiratory:  No shortness of breath at rest or with exertion.   No wheezes GastrointestinaI: Mild dysphagia.  Constipation.  No nausea, vomiting, diarrhea.   Genitourinary:  No dysuria but has urinary retention and frequency.  Desmopressin helps nocturia. Musculoskeletal:  No neck pain,   Has lower back pain and buttocks.  Hips ans other joints ok Integumentary: No rash, pruritus, skin lesions Neurological: as above Psychiatric: see above Endocrine: No palpitations, diaphoresis, change in appetite, change in weigh or increased thirst Hematologic/Lymphatic:  No anemia, purpura, petechiae. Allergic/Immunologic: No itchy/runny eyes, nasal congestion, recent allergic reactions, rashes  ALLERGIES: Allergies  Allergen Reactions  . Codeine Nausea And Vomiting    HOME MEDICATIONS: Outpatient Medications Prior to Visit  Medication Sig Dispense Refill  . acetaminophen (TYLENOL) 500 MG tablet Take 1,000 mg by mouth daily as needed for pain.    . Cholecalciferol (VITAMIN D PO) Take 1 tablet by mouth daily.    Marland Kitchen desmopressin (DDAVP) 0.1 MG tablet Take 1 tablet (0.1 mg total) by mouth at bedtime as needed (bladder.). 30 tablet 11  . diazepam (VALIUM) 5 MG tablet TAKE 1 TABLET BY MOUTH EVERY 8 HOURS AS NEEDED 90 tablet 5  . diclofenac (VOLTAREN) 75 MG EC tablet Take 1 tablet (75 mg total) by mouth 2 (two) times daily. Do not take with  ibuprofen 60 tablet 3  . estradiol (CLIMARA - DOSED IN MG/24 HR) 0.1 mg/24hr patch Place 0.1 mg onto the skin once a week.  5  . gabapentin (NEURONTIN) 800 MG tablet TAKE 1 TABLET(800 MG) BY MOUTH FOUR TIMES DAILY 120 tablet 11  . ibuprofen (ADVIL,MOTRIN) 200 MG tablet Take 400 mg by mouth every 6 (six) hours as needed for headache (headache).    . lamoTRIgine (LAMICTAL) 200 MG tablet Take 1 tablet (200 mg total) by mouth 3 (three) times daily. 270 tablet 1  . lidocaine (LIDODERM) 5 % Place 1 patch daily onto the skin. Remove & Discard patch within 12 hours or as directed by MD 30 patch 11  . meclizine (ANTIVERT) 25 MG tablet Take 1 tablet (25 mg total) by mouth 3 (three) times  daily as needed for dizziness. 30 tablet 0  . methylPREDNISolone (MEDROL DOSEPAK) 4 MG TBPK tablet Take 6 tablets on day 1 and decrease by 1 tablet each day until finished 21 tablet 0  . nitrofurantoin, macrocrystal-monohydrate, (MACROBID) 100 MG capsule Take 1 capsule (100 mg total) by mouth 2 (two) times daily. 14 capsule 0  . omeprazole (PRILOSEC) 40 MG capsule Take 40 mg by mouth 2 (two) times daily.    . ondansetron (ZOFRAN) 4 MG tablet Take 1 tablet (4 mg total) by mouth every 8 (eight) hours as needed for nausea or vomiting. 21 tablet 3  . promethazine (PHENERGAN) 25 MG tablet Take 1 tablet (25 mg total) by mouth every 6 (six) hours as needed for nausea (nausea). 30 tablet 3  . sertraline (ZOLOFT) 50 MG tablet Take 1 tablet (50 mg total) by mouth daily. 30 tablet 11  . traMADol (ULTRAM) 50 MG tablet TAKE 1 TABLET BY MOUTH FOUR TIMES DAILY AS NEEDED FOR MODERATE PAIN 120 tablet 5  . zolpidem (AMBIEN) 10 MG tablet Take 1 tablet (10 mg total) by mouth at bedtime as needed. for sleep 30 tablet 5  . ALPRAZolam (XANAX) 0.5 MG tablet Take 0.5 mg by mouth daily as needed for anxiety.     Marland Kitchen amphetamine-dextroamphetamine (ADDERALL) 20 MG tablet Take 1 tablet (20 mg total) by mouth 2 (two) times daily. 60 tablet 0  . clonazePAM  (KLONOPIN) 1 MG tablet Take 1 tablet (1 mg total) by mouth at bedtime as needed. 30 tablet 3  . tapentadol (NUCYNTA) 50 MG tablet Take 1 tablet (50 mg total) by mouth 3 (three) times daily as needed for moderate pain or severe pain (pain). 90 tablet 0   No facility-administered medications prior to visit.     PAST MEDICAL HISTORY: Past Medical History:  Diagnosis Date  . Anxiety   . Depression   . Gait instability    uses wheelchair or assistance  . H/O steroid therapy    IV infusion every 6 to 8 weeks- last 05-31-14 [facility]  . History of breast biopsy   . Multiple sclerosis ([place])   . Neuromuscular disorder ([place])    MS  . PONV (postoperative nausea and vomiting)   . Skin cancer     PAST SURGICAL HISTORY: Past Surgical History:  Procedure Laterality Date  . ABDOMINAL HYSTERECTOMY    . BREAST BIOPSY Right 09/20/2012   Procedure: BREAST BIOPSY WITH NEEDLE LOCALIZATION;  Surgeon: Rolm Bookbinder, MD;  Location: [facility];  Service: General;  Laterality: Right;  . BREAST EXCISIONAL BIOPSY Right    benign  . CESAREAN SECTION    . COLONOSCOPY WITH PROPOFOL N/A 07/13/2014   Procedure: COLONOSCOPY WITH PROPOFOL;  Surgeon: Beryle Beams, MD;  Location: [facility];  Service: Endoscopy;  Laterality: N/A;  . DEBRIDEMENT SKIN / SQ / MUSCLE OF ARM Right 07/13/2003   I & D; debridement of tissue within triceps muscle  . LEG SURGERY     sx as a child  . MELANOMA EXCISION    . PARTIAL HYSTERECTOMY    . TONSILLECTOMY     as a child    FAMILY HISTORY: Family History  Problem Relation Age of Onset  . Hyperlipidemia Mother   . Hypertension Mother   . Hypertension Father   . Diabetes type II Father     SOCIAL HISTORY:  Social History   Socioeconomic History  . Marital status: Married    Spouse name: Not on file  .  Number of children: Not on file  . Years of education: Not on file  . Highest education level: Not on file  Occupational History  .  Not on file  Social Needs  . Financial resource strain: Not on file  . Food insecurity    Worry: Not on file    Inability: Not on file  . Transportation needs    Medical: Not on file    Non-medical: Not on file  Tobacco Use  . Smoking status: Never Smoker  . Smokeless tobacco: Never Used  Substance and Sexual Activity  . Alcohol use: Not Currently    Comment: social  . Drug use: No  . Sexual activity: Not on file  Lifestyle  . Physical activity    Days per week: Not on file    Minutes per session: Not on file  . Stress: Not on file  Relationships  . Social Herbalist on phone: Not on file    Gets together: Not on file    Attends religious service: Not on file    Active member of club or organization: Not on file    Attends meetings of clubs or organizations: Not on file    Relationship status: Not on file  . Intimate partner violence    Fear of current or ex partner: Not on file    Emotionally abused: Not on file    Physically abused: Not on file    Forced sexual activity: Not on file  Other Topics Concern  . Not on file  Social History Narrative   Lives at home w/ her husband     PHYSICAL EXAM  Vitals:   03/28/19 1530  BP: 110/70  Pulse: 99  Height: 5' 5"  (1.651 m)    Body mass index is 21.63 kg/m.   General: The patient is well-developed and well-nourished and in no acute distress   Skin/Ext/Musculoskeletal: She has pedal edema.   She has tenderness over the left splenius capitis and splenius cervicis muscles and to a lesser extent the trapezius and rhomboid muscles   Neurologic Exam  Mental status: The patient is alert and oriented x 3 at the time of the examination. The patient has apparent normal recent and remote memory, with an apparently normal attention span and concentration ability.   Speech is normal.  Cranial nerves: Extraocular movements are full.  Facial strength and sensation was normal.  Trapezius strength is normal.. No  dysarthria is noted.  No obvious hearing deficits are noted.  Motor:  Muscle bulk and tone are normal.  Strength is 3/5 in the proximal legs, little weaker on the left.  Strength is 4- to 4/5 distally.  The arms are fairly strong.  Sensory: She has reduced touch sensation in the left leg..  Coordination: Cerebellar testing reveals reduced finger-nose-finger and poor heel-to-shin bilaterally.  Gait and station: Not tested today.  Reflexes: Deep tendon reflexes are symmetric and increased in legs bilaterally with spread at the knees.  She has non-sustained ankle clonus bilaterally.    DIAGNOSTIC DATA (LABS, IMAGING, TESTING) - I reviewed patient records, labs, notes, testing and imaging myself where available.  Lab Results  Component Value Date   WBC 8.9 03/07/2018   HGB 14.0 03/07/2018   HCT 43.0 03/07/2018   MCV 97.1 03/07/2018   PLT 236 03/07/2018      Component Value Date/Time   NA 140 03/07/2018 1138   K 3.9 03/07/2018 1138   CL 101 03/07/2018 1138  CO2 30 03/07/2018 1138   GLUCOSE 104 (H) 03/07/2018 1138   BUN 15 03/07/2018 1138   CREATININE 0.67 03/07/2018 1138   CALCIUM 9.7 03/07/2018 1138   PROT 7.7 03/07/2018 1138   PROT 6.6 10/01/2015 1351   ALBUMIN 4.6 03/07/2018 1138   ALBUMIN 4.5 10/01/2015 1351   AST 21 03/07/2018 1138   ALT 16 03/07/2018 1138   ALKPHOS 62 03/07/2018 1138   BILITOT 0.5 03/07/2018 1138   BILITOT 0.4 10/01/2015 1351   GFRNONAA >60 03/07/2018 1138   GFRAA >60 03/07/2018 1138      ASSESSMENT AND PLAN  Multiple sclerosis (HCC)  Right sided sciatica  Left leg weakness  Benign paroxysmal positional vertigo of right ear  Urinary dysfunction  Dysesthesia  Ataxic gait  Chronic insomnia  Pedal edema   1.   We discussed her secondary progressive multiple sclerosis and few treatment options.  She will remain off of a disease modifying therapy 2.     Continue  Adderall for fatigue, focus/attention and Zoloft for depression   3.    Continue gabapentin and lamotrigine 4.   she will return to see me in 4 months or sooner if she has new or worsening neurologic symptoms.   Richard A. Felecia Shelling, MD, PhD 7/93/9688, 6:48 PM Certified in Neurology, Clinical Neurophysiology, Sleep Medicine, Pain Medicine and Neuroimaging  [facility] [address] [place], [place] [address] [phone]

## 2019-04-13 ENCOUNTER — Other Ambulatory Visit: Payer: Self-pay | Admitting: Neurology

## 2019-04-17 ENCOUNTER — Other Ambulatory Visit: Payer: Self-pay | Admitting: *Deleted

## 2019-04-17 ENCOUNTER — Telehealth: Payer: Self-pay | Admitting: Neurology

## 2019-04-17 MED ORDER — ZOLPIDEM TARTRATE 10 MG PO TABS: 10.0000 mg | ORAL_TABLET | Freq: Every evening | ORAL | 5 refills | Status: DC | PRN

## 2019-04-17 NOTE — Telephone Encounter (Signed)
Refill request already pending approval by Dr. Felecia Shelling. Sent this am to Dr. Felecia Shelling

## 2019-04-17 NOTE — Telephone Encounter (Signed)
She has edema and redness in her feet.  She may just want to discuss with primary care to see if medication might help or she could see podiatr (I do not have a preferred name)

## 2019-04-17 NOTE — Telephone Encounter (Signed)
I do not know any of the podiatrists so is difficult for me to make a specific referral.

## 2019-04-17 NOTE — Telephone Encounter (Signed)
Dr. Felecia Shelling- pt only wanted to know if you had a recommendation on what podiatrist to go see.

## 2019-04-17 NOTE — Telephone Encounter (Signed)
Pt called back. I took call from phone staff. Last visit, MD looked at her feet and asked what PCP was doing for her feet but she wants to know what foot MD he would recommend for her to go to. Advised I will ask Dr. Felecia Shelling and call her back.  She is aware rx zolpidem pending approval with MD already.

## 2019-04-17 NOTE — Telephone Encounter (Signed)
Pt called wanting to know if she can be advised on a provider to get her feet checked and she also is needing a refill on her zolpidem (AMBIEN) 10 MG tablet sent in to the [facility] on [address]

## 2019-04-17 NOTE — Telephone Encounter (Signed)
Called, LVM returning pt call 

## 2019-04-17 NOTE — Telephone Encounter (Signed)
I called pt back. Relayed Dr. Felecia Shelling message. She verbalized understanding. She has a couple recommendations from family that she is going to look into. Nothing further needed.

## 2019-05-03 ENCOUNTER — Ambulatory Visit: Payer: Medicare Other | Admitting: Neurology

## 2019-05-03 ENCOUNTER — Other Ambulatory Visit: Payer: Self-pay

## 2019-05-03 ENCOUNTER — Telehealth: Payer: Self-pay | Admitting: Neurology

## 2019-05-03 ENCOUNTER — Encounter: Payer: Self-pay | Admitting: Neurology

## 2019-05-03 VITALS — BP 106/70 | HR 74 | Ht 65.0 in | Wt 139.3 lb

## 2019-05-03 DIAGNOSIS — G35 Multiple sclerosis: Secondary | ICD-10-CM | POA: Diagnosis not present

## 2019-05-03 DIAGNOSIS — M542 Cervicalgia: Secondary | ICD-10-CM | POA: Diagnosis not present

## 2019-05-03 DIAGNOSIS — M7061 Trochanteric bursitis, right hip: Secondary | ICD-10-CM | POA: Diagnosis not present

## 2019-05-03 DIAGNOSIS — R42 Dizziness and giddiness: Secondary | ICD-10-CM

## 2019-05-03 DIAGNOSIS — R260 Ataxic gait: Secondary | ICD-10-CM | POA: Diagnosis not present

## 2019-05-03 DIAGNOSIS — M5431 Sciatica, right side: Secondary | ICD-10-CM

## 2019-05-03 NOTE — Telephone Encounter (Signed)
Pt is calling in wanting to know if she can come in today to get injections in her hip

## 2019-05-03 NOTE — Progress Notes (Addendum)
[facility]  PATIENT: Tonya Powers DOB: 1961-03-26  REFERRING CLINICIAN: Mayra Neer is PCP HISTORY FROM: Patient  REASON FOR VISIT: MS and poor gait   HISTORICAL  CHIEF COMPLAINT:  Chief Complaint  Patient presents with  . Follow-up    Rm 12 alone  . Multiple Sclerosis    Ambulation has decreased reports she is here for neck and hip injections     HISTORY OF PRESENT ILLNESS:  Tonya Powers is a 58 y.o. woman with MS.      Update 05/03/2019: She is noting more pain in the right buttock and the left neck.    Leg pain is worse with walking.  Back pain is more steady.  She denies any change in strength.  She can walk about 20 feet with a walker but she uses her scooter mostly.  She feels her MS is stable compared to her last visit.  Vertigo is not bothering her currently.   Update 03/28/19: She feels she is doing about the same.   She feels her gait is continuing to worsen.   The legs are weak and spastic, left > right.   She notes clonus in the legs in the morning and sometimes has phasic spasms in the right leg.   She uses a scooter mostly and has some falls with transfers.   No injuries.  She has dysesthesias and only has some benefit from lidocaine patches and tramadol.  Lamotrigine 200 mg po tid and gabapentin 1200 mg tid used to help but less recently.  Pain is worse when clothes rubs against her skin.  Tegretol and tricyclics had not helped.   She feels her sciatica type pain is better this visit than most visits.  Vertigo is doing better.  She occasionlly takes valium (just one a day at most).  She takes zolpidem nightly and clonazepam at bedtime rarely.   In the past, she was on baclofen but stopped because she was sleepy on it.     She has some depression and anxiety and gets frustrated with her mobility issues and not being able to do much with her grandchild.  .  She is on sertraline with some benefit.  Adderall helps her fatigue and  focus/attention deficit.     She has swelling, pain and coldness in her toes.   I checked ESR, cryoglobulin, ANCA and ANA for possible Raynauds--- they were fine.    Update 11/09/2018: She feels the MS is stable with weakness and poor gait.    MRI's of the cervical spine and brain have not shown any new activity though plaque burden is high.  She is reporting a lot of pain in the left side of her neck going to the back of the head and the shoulder region.  In the past, trigger point injections have helped reduce the pain.  Zoloft has helped mood but she does feel more sleepy.  She can take Adderall 20 mg bid but usually just takes once a day and we discussed taking 10 - 20 mg around lunchtime.      Update 09/14/2018: Since the last visit, she had an MRI of the cervical spine due to more weakness and left neck pain.    I personally reviewed the MRI of the cervical spine performed 09/13/2018.   It shows multiple T2/flair hyperintense foci in the spinal cord.  I compared it side-by-side with an MRI of the brain performed 02/28/2016 and there are no definite new lesions.  Several of these are lateral and will be expected to affect strength and spasticity.  She also has some degenerative changes but not severe enough to lead to nerve root compression at any of the levels.  There is no spinal stenosis.  She has leg weakness, left > right, and some spasticity.    Intermittent steroids prn have helped her.   She is no longer on a DMT.  She continues to have skin dysesthesias, worse in the left neck/shoulder and the right hip and down.  She also has pain in the left lateral neck of the jaw.  She is on Nucynta when pain is worse and tramadol most days  Her anxiety is doing better on duloxetine but it has not helped the pain and it seems to have changed her taste --- everything is sour.     We discussed switching to an SSRI as  Update 03/10/2018: For the past 2 weeks, she has had more vertigo.  The onset was over  a day.  Meclizine has not helped.  The sister did an Epley maneuver or other vestibular exercise and noted that there was nystagmus.  Over the past week the vertigo persisted and she went to [facility] 03/07/2018 and received IV Ativan with benefit that day.  Zofran greatly helped the nausea.  Initially, she had significant nausea and also some vomiting.  She continues to experience a lot of pain in the right hip and buttock region.  In the past, piriformis and/or trochanteric bursa injections have helped her for many weeks.  Lamotrigine has helped this pain some but oxcarbazepine did not.  She is not currently on a disease modifying therapy for her MS.  She did not feel that she got any benefit from [place] and also was progressing on other medications.  Her main problem is gait and she has bilateral leg weakness.  She can only take a few steps with bilateral support.  She mostly uses a scooter.  She has had a fair amount of fatigue and reports attention deficit disorder.  She has had some depression but mood is doing fairly well currently.     Update 01/03/2018: She is noting more difficulty with her painful dysesthesias.  The wrose pain is in her right hip and left > right  legs but she has skin dysesthestic pain from her chest down (not much in arms).    Her feet get very red with swelling as the day goes on.     She is on lamotrigine 200 mg po bid, gabapentin 800 mg po qid and Nucynta (prn only)    She uses her scooter mostly and can go 20 feet or so with her walker but not repeatedly as it wears her out.   She has left > right leg weakness.   Arms are strong.    She has insomnia and takes clonazepam and doxepin nightly.   She also has anxiety (not depression) and will occ take a valium if this is more of a problem.      Update 04/29/2017: She felt more weakness and vertigo on Ocrelizumab and she has stopped.   She is not currently on any DMT.   She takes steroids IV every month or two and notes  improvement of fatigue and general strength. She has left > right leg weakness and spasticity.   She feels gait is worse and she has more trouble lifting the left leg.   With a walker she can go 10-20 feet  and she uses a scooter mostly.     She can transfer though this is sometimes difficult and she has had falls.   Ampyra had not helped.    She has dysesthetic pain helped by tramadol, gabapentin and lamotrigine.   She uses Nucynta sparingly.    She still has bouts of severe vertigo.   She does Epley maneuvers when it reoccurs.     She has low back pain that intensifies with prolonged siting.   Solanpas lidocaine patches help some.   Piriformis shots have also helped.    She has fatigue daily.  Adderall has greatly helped her fatigue and poor focus.  She has trouble getting comfortable at night according insomnia.  Ambien, clonazepam and doxepin help her sleep but without one of them, sleep is worse.        From 02/02/2017:   Vertigo:    She has had vertigo x 15 days.   She has been on meclizine, phenergan, Medrol dosepak, clonazepam (not sure if helped but sleeps better)and IV Solu-Medrol.  Dramamine may have helped slightly.   She has vertigo while still and has diplopia.    Initially she also had N/V.  The diplopia is worse with movements.     She went to the ED and had a Morphine shot when pain was worse and also had IV fluids.     MS:   She is back on the Ocrevus.   She thinks her MS is probably stable but is concerned that the vertigo might be a new exacerbation.Marland Kitchen    She was on Tysabri and was stable but converted to JCV Ab positive and stopped.   She could not tolerate Tecfidera.        Gait/strength :    Her gait is very poor and she mostly uses a scooter. She can use a walker for a few steps (probably up to 20 feet).     She has left > right leg weakness and spasticity.       Dysesthesia/hip pain:   She continues to report burning pain in the right leg and right flank lamotrigine has only  helps the pain a little bit.    Tegretol and gabapentin have not helped much.   TCA's have not helped.     Baclofen made her feel weaker and she stopped.     Nucynta is too expensive.   Tramadol has not helped much   Bladder/bowel:  She has a difficult bladder dysfunction with frequent incontinence. She has quite a bit of hesitancy, worse when she uses the opiates (now off of these).    Tamsulosin did not really help the hesitancy. Desmopressin helps her to the bed at night to help her more. She still has frequent incontinence.     She reports constipation. Linzess did not help.    Nucynta does not worsen constipation like opiates did but is very expensive.  Fatigue/sleep:   She has fatigue most days, worse with heat. Adderall has helped.   She does well doing pool exercises.,  When fatiue is more severe she takes  Solu-Medrol infusions.   She has both sleep onset and sleep maintenance insomnia.   She takes clonazepam 1 mg and Ambien every night due to difficulty falling and staying asleep. Clonazepam also helps her leg spasms as well as helps her to fall asleep.   Initially, does note progressive help her sleep but now she is having a lot of incontinence again for  Mood/cognition:   She notes mild depression and anxiety.    She prefers not to take an antidepressant.    She rarely takes Xanax 0.5 mg when she has more anxiety. She notes more trouble with cognition, especially short term memory.    Adderall helps focus some    MS History:  She presented with optic neuritis in 1991 followed shortly by difficulties with leg weakness and by right trigeminal neuralgia. In retrospect, couple years earlier when she had her daughter, has some difficulties with her legs and needed to go on short-term disability. At first she was not diagnosed with MS but after more of the symptoms she underwent MRI testing and had a lumbar puncture by Dr. Johnnye Sima. The imaging and the CSF was consistent with multiple sclerosis. When  Betaseron became available she was placed on that. She was on Betaseron for about 10 years. She felt that she did not have too many exacerbations during that time but she had a lot of difficulty tolerating the Betaseron due to skin reactions. Around 12-15 years ago, she started to use a cane and she has had progressive gait disturbance over the last decade. Around the house for short distances, she uses a walker but uses her scooter for longer distances. Outside she uses a wheelchair pushed by others.    REVIEW OF SYSTEMS:  Constitutional: No fevers, chills, sweats, or change in appetite.  She has Fatigue Eyes: No visual changes, double vision, eye pain Ear, nose and throat: No hearing loss, ear pain, nasal congestion, sore throat Cardiovascular: No chest pain, palpitations Respiratory:  No shortness of breath at rest or with exertion.   No wheezes GastrointestinaI: Mild dysphagia.  Constipation.  No nausea, vomiting, diarrhea.   Genitourinary:  No dysuria but has urinary retention and frequency.  Desmopressin helps nocturia. Musculoskeletal:  No neck pain,   Has lower back pain and buttocks.  Hips ans other joints ok Integumentary: No rash, pruritus, skin lesions Neurological: as above Psychiatric: see above Endocrine: No palpitations, diaphoresis, change in appetite, change in weigh or increased thirst Hematologic/Lymphatic:  No anemia, purpura, petechiae. Allergic/Immunologic: No itchy/runny eyes, nasal congestion, recent allergic reactions, rashes  ALLERGIES: Allergies  Allergen Reactions  . Codeine Nausea And Vomiting    HOME MEDICATIONS: Outpatient Medications Prior to Visit  Medication Sig Dispense Refill  . acetaminophen (TYLENOL) 500 MG tablet Take 1,000 mg by mouth daily as needed for pain.    Marland Kitchen amphetamine-dextroamphetamine (ADDERALL) 20 MG tablet Take 1 tablet (20 mg total) by mouth 2 (two) times daily. 60 tablet 0  . Cholecalciferol (VITAMIN D PO) Take 1 tablet by mouth  daily.    . clonazePAM (KLONOPIN) 1 MG tablet Take 1 tablet (1 mg total) by mouth at bedtime as needed. 30 tablet 3  . desmopressin (DDAVP) 0.1 MG tablet Take 1 tablet (0.1 mg total) by mouth at bedtime as needed (bladder.). 30 tablet 11  . diazepam (VALIUM) 5 MG tablet TAKE 1 TABLET BY MOUTH EVERY 8 HOURS AS NEEDED 90 tablet 5  . diclofenac (VOLTAREN) 75 MG EC tablet Take 1 tablet (75 mg total) by mouth 2 (two) times daily. Do not take with ibuprofen 60 tablet 3  . estradiol (CLIMARA - DOSED IN MG/24 HR) 0.1 mg/24hr patch Place 0.1 mg onto the skin once a week.  5  . gabapentin (NEURONTIN) 800 MG tablet TAKE 1 TABLET(800 MG) BY MOUTH FOUR TIMES DAILY 120 tablet 11  . ibuprofen (ADVIL,MOTRIN) 200 MG tablet Take 400 mg  by mouth every 6 (six) hours as needed for headache (headache).    . lidocaine (LIDODERM) 5 % Place 1 patch daily onto the skin. Remove & Discard patch within 12 hours or as directed by MD 30 patch 11  . meclizine (ANTIVERT) 25 MG tablet Take 1 tablet (25 mg total) by mouth 3 (three) times daily as needed for dizziness. 30 tablet 0  . methylPREDNISolone (MEDROL DOSEPAK) 4 MG TBPK tablet Take 6 tablets on day 1 and decrease by 1 tablet each day until finished 21 tablet 0  . nitrofurantoin, macrocrystal-monohydrate, (MACROBID) 100 MG capsule Take 1 capsule (100 mg total) by mouth 2 (two) times daily. 14 capsule 0  . omeprazole (PRILOSEC) 40 MG capsule Take 40 mg by mouth 2 (two) times daily.    . ondansetron (ZOFRAN) 4 MG tablet Take 1 tablet (4 mg total) by mouth every 8 (eight) hours as needed for nausea or vomiting. 21 tablet 3  . promethazine (PHENERGAN) 25 MG tablet Take 1 tablet (25 mg total) by mouth every 6 (six) hours as needed for nausea (nausea). 30 tablet 3  . sertraline (ZOLOFT) 50 MG tablet Take 1 tablet (50 mg total) by mouth daily. 30 tablet 11  . traMADol (ULTRAM) 50 MG tablet TAKE 1 TABLET BY MOUTH FOUR TIMES DAILY AS NEEDED FOR MODERATE PAIN 120 tablet 5  . zolpidem  (AMBIEN) 10 MG tablet Take 1 tablet (10 mg total) by mouth at bedtime as needed. for sleep 30 tablet 5  . lamoTRIgine (LAMICTAL) 200 MG tablet Take 1 tablet (200 mg total) by mouth 3 (three) times daily. 270 tablet 1   No facility-administered medications prior to visit.     PAST MEDICAL HISTORY: Past Medical History:  Diagnosis Date  . Anxiety   . Depression   . Gait instability    uses wheelchair or assistance  . H/O steroid therapy    IV infusion every 6 to 8 weeks- last 05-31-14 [facility]  . History of breast biopsy   . Multiple sclerosis ([place])   . Neuromuscular disorder ([place])    MS  . PONV (postoperative nausea and vomiting)   . Skin cancer     PAST SURGICAL HISTORY: Past Surgical History:  Procedure Laterality Date  . ABDOMINAL HYSTERECTOMY    . BREAST BIOPSY Right 09/20/2012   Procedure: BREAST BIOPSY WITH NEEDLE LOCALIZATION;  Surgeon: Rolm Bookbinder, MD;  Location: [facility];  Service: General;  Laterality: Right;  . BREAST EXCISIONAL BIOPSY Right    benign  . CESAREAN SECTION    . COLONOSCOPY WITH PROPOFOL N/A 07/13/2014   Procedure: COLONOSCOPY WITH PROPOFOL;  Surgeon: Beryle Beams, MD;  Location: [facility];  Service: Endoscopy;  Laterality: N/A;  . DEBRIDEMENT SKIN / SQ / MUSCLE OF ARM Right 07/13/2003   I & D; debridement of tissue within triceps muscle  . LEG SURGERY     sx as a child  . MELANOMA EXCISION    . PARTIAL HYSTERECTOMY    . TONSILLECTOMY     as a child    FAMILY HISTORY: Family History  Problem Relation Age of Onset  . Hyperlipidemia Mother   . Hypertension Mother   . Hypertension Father   . Diabetes type II Father     SOCIAL HISTORY:  Social History   Socioeconomic History  . Marital status: Married    Spouse name: Not on file  . Number of children: Not on file  . Years of education: Not on  file  . Highest education level: Not on file  Occupational History  . Not on file  Social Needs  .  Financial resource strain: Not on file  . Food insecurity    Worry: Not on file    Inability: Not on file  . Transportation needs    Medical: Not on file    Non-medical: Not on file  Tobacco Use  . Smoking status: Never Smoker  . Smokeless tobacco: Never Used  Substance and Sexual Activity  . Alcohol use: Not Currently    Comment: social  . Drug use: No  . Sexual activity: Not on file  Lifestyle  . Physical activity    Days per week: Not on file    Minutes per session: Not on file  . Stress: Not on file  Relationships  . Social Herbalist on phone: Not on file    Gets together: Not on file    Attends religious service: Not on file    Active member of club or organization: Not on file    Attends meetings of clubs or organizations: Not on file    Relationship status: Not on file  . Intimate partner violence    Fear of current or ex partner: Not on file    Emotionally abused: Not on file    Physically abused: Not on file    Forced sexual activity: Not on file  Other Topics Concern  . Not on file  Social History Narrative   Lives at home w/ her husband     PHYSICAL EXAM  Vitals:   05/03/19 1407  BP: 106/70  Pulse: 74  SpO2: 96%  Weight: 139 lb 5 oz (63.2 kg)  Height: _0  (1.651 m)    Body mass index is 23.18 kg/m.   General: The patient is well-developed and well-nourished and in no acute distress   Skin/Ext/Musculoskeletal: She has pedal edema.   Tender over the trochanteric bursa on the right.   She has tenderness over the left splenius capitis and splenius cervicis muscles and to a lesser extent the trapezius and rhomboid muscles   Neurologic Exam  Mental status: The patient is alert and oriented x 3 at the time of the examination. The patient has apparent normal recent and remote memory, with an apparently normal attention span and concentration ability.   Speech is normal.  Cranial nerves: Extraocular movements are full.  Facial strength and  sensation was normal.  Trapezius strength is normal.. No dysarthria is noted.  No obvious hearing deficits are noted.  Motor:  Muscle bulk and tone are normal.  Strength is 3/5 in the proximal legs, little weaker on the left.  Strength is 4- to 4/5 distally.  The arms are fairly strong.  Sensory: She has reduced touch sensation in the left leg..  Coordination: Cerebellar testing reveals reduced finger-nose-finger and poor heel-to-shin bilaterally.  Gait and station: Not tested today.  Reflexes: Deep tendon reflexes are symmetric and increased in legs bilaterally with spread at the knees.  She has non-sustained ankle clonus bilaterally.    DIAGNOSTIC DATA (LABS, IMAGING, TESTING) - I reviewed patient records, labs, notes, testing and imaging myself where available.  Lab Results  Component Value Date   WBC 8.9 03/07/2018   HGB 14.0 03/07/2018   HCT 43.0 03/07/2018   MCV 97.1 03/07/2018   PLT 236 03/07/2018      Component Value Date/Time   NA 140 03/07/2018 1138   K 3.9 03/07/2018 1138  CL 101 03/07/2018 1138   CO2 30 03/07/2018 1138   GLUCOSE 104 (H) 03/07/2018 1138   BUN 15 03/07/2018 1138   CREATININE 0.67 03/07/2018 1138   CALCIUM 9.7 03/07/2018 1138   PROT 7.7 03/07/2018 1138   PROT 6.6 10/01/2015 1351   ALBUMIN 4.6 03/07/2018 1138   ALBUMIN 4.5 10/01/2015 1351   AST 21 03/07/2018 1138   ALT 16 03/07/2018 1138   ALKPHOS 62 03/07/2018 1138   BILITOT 0.5 03/07/2018 1138   BILITOT 0.4 10/01/2015 1351   GFRNONAA >60 03/07/2018 1138   GFRAA >60 03/07/2018 1138      ASSESSMENT AND PLAN  Multiple sclerosis (H[facility])  Right sided sciatica  Trochanteric bursitis of right hip  Ataxic gait  Vertigo  Neck pain   1.   Trigger point injection into left splenius capitus, splenius cervicus and trapezius muscles with 40 mg Depo-Medrol in [place] using sterile technique.   2.     Inject right trochanteric bursa with 40 mg Depo-Medrol in [place] using sterile  technique. 4.  Continue  Adderall for fatigue, focus/attention and Zoloft for depression   5.   Continue gabapentin and lamotrigine 6.   she will return to see me in 4 months or sooner if she has new or worsening neurologic symptoms.    Loni Abdon A. Felecia Shelling, MD, PhD 70/06/7791, 90:30 AM Certified in Neurology, Clinical Neurophysiology, Sleep Medicine, Pain Medicine and Neuroimaging  [facility] [address], [place], [place] [address] [phone]

## 2019-05-03 NOTE — Telephone Encounter (Signed)
Called pt. Scheduled appt for today at 2pm with Dr. Felecia Shelling (ok per MD). Asked her to check in no later than 1:45pm. Pt verbalized understanding.

## 2019-05-06 ENCOUNTER — Other Ambulatory Visit: Payer: Self-pay | Admitting: Neurology

## 2019-05-29 ENCOUNTER — Other Ambulatory Visit: Payer: Self-pay | Admitting: Neurology

## 2019-05-29 MED ORDER — AMPHETAMINE-DEXTROAMPHETAMINE 20 MG PO TABS: 20.0000 mg | ORAL_TABLET | Freq: Two times a day (BID) | ORAL | 0 refills | Status: DC

## 2019-05-29 NOTE — Telephone Encounter (Signed)
1) Medication(s) Requested (by name):  adderall 2) Pharmacy of Choice:  [facility] [place], [place] AT [address] [place]

## 2019-06-06 ENCOUNTER — Telehealth: Payer: Self-pay | Admitting: Neurology

## 2019-06-06 DIAGNOSIS — R399 Unspecified symptoms and signs involving the genitourinary system: Secondary | ICD-10-CM

## 2019-06-06 DIAGNOSIS — G35 Multiple sclerosis: Secondary | ICD-10-CM

## 2019-06-06 NOTE — Telephone Encounter (Signed)
Patient called wanting to know if she could be prescribed prednisone injections/IV.  Patient states she is "going down hill real fast"    Please follow up.

## 2019-06-06 NOTE — Telephone Encounter (Addendum)
Per Dr. Felecia Shelling, ok to bring pt in for 1 day IV solumedrol 1G. Gave completed/signed orders and asked infusion suite call pt to schedule. He would like to check the following prior to pt getting IV steroids: UA/culture, CBC, CMP. Placed future orders in epic  Called pt back. Infusion scheduled her for infusion tomorrow at 3pm. She verbalized understanding. She is aware to gets labs/UA prior to infusion tomorrow.

## 2019-06-06 NOTE — Telephone Encounter (Signed)
Called Tonya Powers back. She was last seen 05/03/2019. Feels she has gone downhill. She cannot transfer herself and hard for her to get out of bed. Denies starting any new meds recently. Tonya Powers feels she may have UTI that started 2 weeks ago. She is unsure.  She is supposed to get her [name] back tomorrow. If Dr. Felecia Shelling approved IV steroids, appt would have to be after 1:30pm tomorrow or Thursday. Advised I will discuss with MD and call her back.

## 2019-06-07 ENCOUNTER — Other Ambulatory Visit (INDEPENDENT_AMBULATORY_CARE_PROVIDER_SITE_OTHER): Payer: Self-pay

## 2019-06-07 DIAGNOSIS — R399 Unspecified symptoms and signs involving the genitourinary system: Secondary | ICD-10-CM

## 2019-06-07 DIAGNOSIS — G35 Multiple sclerosis: Secondary | ICD-10-CM

## 2019-06-07 DIAGNOSIS — Z0289 Encounter for other administrative examinations: Secondary | ICD-10-CM

## 2019-06-08 ENCOUNTER — Other Ambulatory Visit: Payer: Self-pay | Admitting: Neurology

## 2019-06-08 ENCOUNTER — Telehealth: Payer: Self-pay | Admitting: *Deleted

## 2019-06-08 LAB — CBC WITH DIFFERENTIAL/PLATELET
Basophils Absolute: 0.1 10*3/uL (ref 0.0–0.2)
Basos: 1 %
EOS (ABSOLUTE): 0.1 10*3/uL (ref 0.0–0.4)
Eos: 2 %
Hematocrit: 40.8 % (ref 34.0–46.6)
Hemoglobin: 13.9 g/dL (ref 11.1–15.9)
Immature Grans (Abs): 0 10*3/uL (ref 0.0–0.1)
Immature Granulocytes: 0 %
Lymphocytes Absolute: 2.1 10*3/uL (ref 0.7–3.1)
Lymphs: 25 %
MCH: 31.1 pg (ref 26.6–33.0)
MCHC: 34.1 g/dL (ref 31.5–35.7)
MCV: 91 fL (ref 79–97)
Monocytes Absolute: 0.5 10*3/uL (ref 0.1–0.9)
Monocytes: 6 %
Neutrophils Absolute: 5.7 10*3/uL (ref 1.4–7.0)
Neutrophils: 66 %
Platelets: 271 10*3/uL (ref 150–450)
RBC: 4.47 x10E6/uL (ref 3.77–5.28)
RDW: 12.6 % (ref 11.7–15.4)
WBC: 8.5 10*3/uL (ref 3.4–10.8)

## 2019-06-08 LAB — COMPREHENSIVE METABOLIC PANEL
ALT: 13 IU/L (ref 0–32)
AST: 19 IU/L (ref 0–40)
Albumin/Globulin Ratio: 2.5 — ABNORMAL HIGH (ref 1.2–2.2)
Albumin: 4.9 g/dL (ref 3.8–4.9)
Alkaline Phosphatase: 101 IU/L (ref 39–117)
BUN/Creatinine Ratio: 21 (ref 12–28)
BUN: 15 mg/dL (ref 8–27)
Bilirubin Total: 0.2 mg/dL (ref 0.0–1.2)
CO2: 27 mmol/L (ref 20–29)
Calcium: 10 mg/dL (ref 8.7–10.3)
Chloride: 100 mmol/L (ref 96–106)
Creatinine, Ser: 0.7 mg/dL (ref 0.57–1.00)
GFR calc Af Amer: 109 mL/min/{1.73_m2} (ref >59)
GFR calc non Af Amer: 94 mL/min/{1.73_m2} (ref >59)
Globulin, Total: 2 g/dL (ref 1.5–4.5)
Glucose: 95 mg/dL (ref 65–99)
Potassium: 5.2 mmol/L (ref 3.5–5.2)
Sodium: 144 mmol/L (ref 134–144)
Total Protein: 6.9 g/dL (ref 6.0–8.5)

## 2019-06-08 LAB — URINALYSIS, ROUTINE W REFLEX MICROSCOPIC
Bilirubin, UA: NEGATIVE
Glucose, UA: NEGATIVE
Ketones, UA: NEGATIVE
Nitrite, UA: POSITIVE — AB
Specific Gravity, UA: 1.024 (ref 1.005–1.030)
Urobilinogen, Ur: 0.2 mg/dL (ref 0.2–1.0)
pH, UA: 5.5 (ref 5.0–7.5)

## 2019-06-08 LAB — MICROSCOPIC EXAMINATION: Casts: NONE SEEN /lpf

## 2019-06-08 MED ORDER — SULFAMETHOXAZOLE-TRIMETHOPRIM 800-160 MG PO TABS: ORAL_TABLET | ORAL | 0 refills | Status: DC

## 2019-06-08 NOTE — Telephone Encounter (Signed)
Called pt and relayed results per MD note. Pt verbalized understanding and requested rx be sent to [facility], [address] AT [place]. I escribed rx.

## 2019-06-08 NOTE — Telephone Encounter (Signed)
-----   Message from Britt Bottom, MD sent at 06/08/2019  9:30 AM EST ----- She appears to have a urinary tract infection.  Please send in Bactrim DS #14  1 p.o. twice daily

## 2019-06-19 ENCOUNTER — Telehealth: Payer: Self-pay | Admitting: Neurology

## 2019-06-19 NOTE — Telephone Encounter (Signed)
Pt has called to report that shortly after speaking with Terrence Dupont RN on 12-10 she fell and hurt her "good leg" pt states since the 10th she has been in pain.  Pt has spoken with her primary and there was mentioned concern of a blood clot and pt was advised to go to ED.  Pt states she doesn't want to do that.  Pt states she would like to hear what Terrence Dupont RN would suggest, please call

## 2019-06-19 NOTE — Telephone Encounter (Signed)
Called pt back. She fell on 06/08/19 after I spoke with her about lab results. Confirmed she finished bactrim for UTI called in on 06/08/19. Prior to fall, she was holding onto bar that is near her clothes. She lost her balance and fell. Her good leg fell underneath her. She had to call her husband to help her back into her scooter. Ever since, she has swelling in her ankle/foot. Having pain in her calf/muscle tightness. This is what she is concerned about. She had a fall two days later in her bathroom on 06/10/2019 as well.   She spoke with PCP today who recommended a virtual visit but pt declined. She wanted to call our office to see what we recommend before proceeding. I recommended she call PCP back to do virtual visit or she has option to go to either urgent care or ED to be further evaluated/treated due to ongoing sx. She verbalized understanding and appreciation.

## 2019-06-19 NOTE — Telephone Encounter (Signed)
Pt has been informed.

## 2019-06-19 NOTE — Telephone Encounter (Signed)
If she has fallen and has pain, she needs to get checked out, I would recommend she go to urgent care because she may need x-rays or she may have to go to the emergency room.  I would favor she go to UC, because of likely more wait time in ER.

## 2019-06-26 DIAGNOSIS — M79604 Pain in right leg: Secondary | ICD-10-CM | POA: Diagnosis not present

## 2019-06-26 DIAGNOSIS — S82831A Other fracture of upper and lower end of right fibula, initial encounter for closed fracture: Secondary | ICD-10-CM | POA: Diagnosis not present

## 2019-06-26 DIAGNOSIS — S93401A Sprain of unspecified ligament of right ankle, initial encounter: Secondary | ICD-10-CM | POA: Diagnosis not present

## 2019-06-27 ENCOUNTER — Ambulatory Visit (HOSPITAL_COMMUNITY)
Admission: RE | Admit: 2019-06-27 | Discharge: 2019-06-27 | Disposition: A | Discharge status: Home / Self Care | Payer: Medicare Other | Source: Ambulatory Visit | Attending: Internal Medicine | Admitting: Internal Medicine

## 2019-06-27 ENCOUNTER — Other Ambulatory Visit: Payer: Self-pay

## 2019-06-27 ENCOUNTER — Other Ambulatory Visit (HOSPITAL_COMMUNITY): Payer: Self-pay | Admitting: Sports Medicine

## 2019-06-27 DIAGNOSIS — M7989 Other specified soft tissue disorders: Secondary | ICD-10-CM | POA: Diagnosis not present

## 2019-06-27 DIAGNOSIS — M79661 Pain in right lower leg: Secondary | ICD-10-CM

## 2019-07-03 DIAGNOSIS — F419 Anxiety disorder, unspecified: Secondary | ICD-10-CM | POA: Diagnosis not present

## 2019-07-03 DIAGNOSIS — Z85820 Personal history of malignant melanoma of skin: Secondary | ICD-10-CM | POA: Diagnosis not present

## 2019-07-03 DIAGNOSIS — M5431 Sciatica, right side: Secondary | ICD-10-CM | POA: Diagnosis not present

## 2019-07-03 DIAGNOSIS — G35 Multiple sclerosis: Secondary | ICD-10-CM | POA: Diagnosis not present

## 2019-07-03 DIAGNOSIS — G4700 Insomnia, unspecified: Secondary | ICD-10-CM | POA: Diagnosis not present

## 2019-07-03 DIAGNOSIS — Z9181 History of falling: Secondary | ICD-10-CM | POA: Diagnosis not present

## 2019-07-03 DIAGNOSIS — F329 Major depressive disorder, single episode, unspecified: Secondary | ICD-10-CM | POA: Diagnosis not present

## 2019-07-03 DIAGNOSIS — S82831D Other fracture of upper and lower end of right fibula, subsequent encounter for closed fracture with routine healing: Secondary | ICD-10-CM | POA: Diagnosis not present

## 2019-07-08 ENCOUNTER — Other Ambulatory Visit: Payer: Self-pay | Admitting: Neurology

## 2019-07-10 DIAGNOSIS — R8290 Unspecified abnormal findings in urine: Secondary | ICD-10-CM | POA: Diagnosis not present

## 2019-07-10 DIAGNOSIS — M79604 Pain in right leg: Secondary | ICD-10-CM | POA: Diagnosis not present

## 2019-07-10 DIAGNOSIS — S82819A Torus fracture of upper end of unspecified fibula, initial encounter for closed fracture: Secondary | ICD-10-CM | POA: Diagnosis not present

## 2019-07-10 NOTE — Telephone Encounter (Signed)
Checked drug registry. She last refill diazepam 04/22/2019 #90. Last seen 05/03/19 and next f/u 08/03/19

## 2019-07-12 ENCOUNTER — Other Ambulatory Visit: Payer: Self-pay

## 2019-07-12 MED ORDER — AMPHETAMINE-DEXTROAMPHETAMINE 20 MG PO TABS: 20.0000 mg | ORAL_TABLET | Freq: Two times a day (BID) | ORAL | 0 refills | Status: DC

## 2019-07-12 NOTE — Telephone Encounter (Signed)
1) Mamphetamine-dextroamphetamine (ADDERALL) 20 MG tablet  edication(s) Requested (by name):  2) Pharmacy of Choice: [facility] [place], [place] AT [address],

## 2019-07-20 DIAGNOSIS — D225 Melanocytic nevi of trunk: Secondary | ICD-10-CM | POA: Diagnosis not present

## 2019-07-20 DIAGNOSIS — Z85828 Personal history of other malignant neoplasm of skin: Secondary | ICD-10-CM | POA: Diagnosis not present

## 2019-07-20 DIAGNOSIS — L570 Actinic keratosis: Secondary | ICD-10-CM | POA: Diagnosis not present

## 2019-07-20 DIAGNOSIS — L819 Disorder of pigmentation, unspecified: Secondary | ICD-10-CM | POA: Diagnosis not present

## 2019-07-21 DIAGNOSIS — M79604 Pain in right leg: Secondary | ICD-10-CM | POA: Diagnosis not present

## 2019-07-26 DIAGNOSIS — G35 Multiple sclerosis: Secondary | ICD-10-CM | POA: Diagnosis not present

## 2019-07-26 DIAGNOSIS — G4700 Insomnia, unspecified: Secondary | ICD-10-CM | POA: Diagnosis not present

## 2019-07-26 DIAGNOSIS — R270 Ataxia, unspecified: Secondary | ICD-10-CM | POA: Diagnosis not present

## 2019-07-26 DIAGNOSIS — Z0000 Encounter for general adult medical examination without abnormal findings: Secondary | ICD-10-CM | POA: Diagnosis not present

## 2019-08-03 ENCOUNTER — Encounter: Payer: Self-pay | Admitting: Neurology

## 2019-08-03 ENCOUNTER — Other Ambulatory Visit: Payer: Self-pay

## 2019-08-03 ENCOUNTER — Ambulatory Visit (INDEPENDENT_AMBULATORY_CARE_PROVIDER_SITE_OTHER): Payer: Medicare Other | Admitting: Neurology

## 2019-08-03 VITALS — BP 95/65 | HR 74 | Temp 97.0°F | Ht 65.0 in

## 2019-08-03 DIAGNOSIS — G35 Multiple sclerosis: Secondary | ICD-10-CM | POA: Diagnosis not present

## 2019-08-03 DIAGNOSIS — R29898 Other symptoms and signs involving the musculoskeletal system: Secondary | ICD-10-CM | POA: Diagnosis not present

## 2019-08-03 DIAGNOSIS — R208 Other disturbances of skin sensation: Secondary | ICD-10-CM | POA: Diagnosis not present

## 2019-08-03 DIAGNOSIS — R39198 Other difficulties with micturition: Secondary | ICD-10-CM | POA: Diagnosis not present

## 2019-08-03 MED ORDER — AMPHETAMINE-DEXTROAMPHETAMINE 20 MG PO TABS: 20.0000 mg | ORAL_TABLET | Freq: Two times a day (BID) | ORAL | 0 refills | Status: DC

## 2019-08-03 NOTE — Progress Notes (Signed)
[facility]  PATIENT: Tonya Powers DOB: 1960-10-03  REFERRING CLINICIAN: Mayra Neer is PCP HISTORY FROM: Patient  REASON FOR VISIT: MS and poor gait   HISTORICAL  CHIEF COMPLAINT:  Chief Complaint  Patient presents with  . Follow-up    RM 12, alone. Last seen 05/03/2019    HISTORY OF PRESENT ILLNESS:  Tonya Powers is a 59 y.o. woman with MS.      Update 08/03/2019;: She fell in December and broke her right leg and sprained the right ankle.   She only did one PT session afterwards.   She is trying to do some exercises from PT at home.  She is seeing Dr. Doran Durand [facility] and will be seeing him later in the week.   She is noting more swelling in her legs.    She had no weight bearing and had a brace x 2 weeks.     She hasn't been using a walker since this started.   She can transfer independently.   She continues to have back pain.   She has had neck pain and fullness and has had a barium swallow showing esophageal spasms.  She had no strictures noted.  She also is to have an EGD with Dr. Collene Mares due to continued symptoms.     Her father recently died and combined with her fracture she is more depressed and stressed.   She is sleeping ok now with the clonazepam and only needing Ambien some days.  Update 05/03/2019: She is noting more pain in the right buttock and the left neck.    Leg pain is worse with walking.  Back pain is more steady.  She denies any change in strength.  She can walk about 20 feet with a walker but she uses her scooter mostly.  She feels her MS is stable compared to her last visit.  Vertigo is not bothering her currently.   Update 03/28/19: She feels she is doing about the same.   She feels her gait is continuing to worsen.   The legs are weak and spastic, left > right.   She notes clonus in the legs in the morning and sometimes has phasic spasms in the right leg.   She uses a scooter mostly and has some falls with transfers.   No  injuries.  She has dysesthesias and only has some benefit from lidocaine patches and tramadol.  Lamotrigine 200 mg po tid and gabapentin 1200 mg tid used to help but less recently.  Pain is worse when clothes rubs against her skin.  Tegretol and tricyclics had not helped.   She feels her sciatica type pain is better this visit than most visits.  Vertigo is doing better.  She occasionlly takes valium (just one a day at most).  She takes zolpidem nightly and clonazepam at bedtime rarely.   In the past, she was on baclofen but stopped because she was sleepy on it.     She has some depression and anxiety and gets frustrated with her mobility issues and not being able to do much with her grandchild.  .  She is on sertraline with some benefit.  Adderall helps her fatigue and focus/attention deficit.     She has swelling, pain and coldness in her toes.   I checked ESR, cryoglobulin, ANCA and ANA for possible Raynauds--- they were fine.    Update 11/09/2018: She feels the MS is stable with weakness and poor gait.    MRI's  of the cervical spine and brain have not shown any new activity though plaque burden is high.  She is reporting a lot of pain in the left side of her neck going to the back of the head and the shoulder region.  In the past, trigger point injections have helped reduce the pain.  Zoloft has helped mood but she does feel more sleepy.  She can take Adderall 20 mg bid but usually just takes once a day and we discussed taking 10 - 20 mg around lunchtime.      Update 09/14/2018: Since the last visit, she had an MRI of the cervical spine due to more weakness and left neck pain.    I personally reviewed the MRI of the cervical spine performed 09/13/2018.   It shows multiple T2/flair hyperintense foci in the spinal cord.  I compared it side-by-side with an MRI of the brain performed 02/28/2016 and there are no definite new lesions.   Several of these are lateral and will be expected to affect strength  and spasticity.  She also has some degenerative changes but not severe enough to lead to nerve root compression at any of the levels.  There is no spinal stenosis.  She has leg weakness, left > right, and some spasticity.    Intermittent steroids prn have helped her.   She is no longer on a DMT.  She continues to have skin dysesthesias, worse in the left neck/shoulder and the right hip and down.  She also has pain in the left lateral neck of the jaw.  She is on Nucynta when pain is worse and tramadol most days  Her anxiety is doing better on duloxetine but it has not helped the pain and it seems to have changed her taste --- everything is sour.     We discussed switching to an SSRI as  Update 03/10/2018: For the past 2 weeks, she has had more vertigo.  The onset was over a day.  Meclizine has not helped.  The sister did an Epley maneuver or other vestibular exercise and noted that there was nystagmus.  Over the past week the vertigo persisted and she went to [facility] 03/07/2018 and received IV Ativan with benefit that day.  Zofran greatly helped the nausea.  Initially, she had significant nausea and also some vomiting.  She continues to experience a lot of pain in the right hip and buttock region.  In the past, piriformis and/or trochanteric bursa injections have helped her for many weeks.  Lamotrigine has helped this pain some but oxcarbazepine did not.  She is not currently on a disease modifying therapy for her MS.  She did not feel that she got any benefit from Blende and also was progressing on other medications.  Her main problem is gait and she has bilateral leg weakness.  She can only take a few steps with bilateral support.  She mostly uses a scooter.  She has had a fair amount of fatigue and reports attention deficit disorder.  She has had some depression but mood is doing fairly well currently.     Update 01/03/2018: She is noting more difficulty with her painful dysesthesias.  The wrose pain  is in her right hip and left > right  legs but she has skin dysesthestic pain from her chest down (not much in arms).    Her feet get very red with swelling as the day goes on.     She is on lamotrigine 200  mg po bid, gabapentin 800 mg po qid and Nucynta (prn only)    She uses her scooter mostly and can go 20 feet or so with her walker but not repeatedly as it wears her out.   She has left > right leg weakness.   Arms are strong.    She has insomnia and takes clonazepam and doxepin nightly.   She also has anxiety (not depression) and will occ take a valium if this is more of a problem.      Update 04/29/2017: She felt more weakness and vertigo on Ocrelizumab and she has stopped.   She is not currently on any DMT.   She takes steroids IV every month or two and notes improvement of fatigue and general strength. She has left > right leg weakness and spasticity.   She feels gait is worse and she has more trouble lifting the left leg.   With a walker she can go 10-20 feet and she uses a scooter mostly.     She can transfer though this is sometimes difficult and she has had falls.   Ampyra had not helped.    She has dysesthetic pain helped by tramadol, gabapentin and lamotrigine.   She uses Nucynta sparingly.    She still has bouts of severe vertigo.   She does Epley maneuvers when it reoccurs.     She has low back pain that intensifies with prolonged siting.   Solanpas lidocaine patches help some.   Piriformis shots have also helped.    She has fatigue daily.  Adderall has greatly helped her fatigue and poor focus.  She has trouble getting comfortable at night according insomnia.  Ambien, clonazepam and doxepin help her sleep but without one of them, sleep is worse.        From 02/02/2017:   Vertigo:    She has had vertigo x 15 days.   She has been on meclizine, phenergan, Medrol dosepak, clonazepam (not sure if helped but sleeps better)and IV Solu-Medrol.  Dramamine may have helped slightly.   She has  vertigo while still and has diplopia.    Initially she also had N/V.  The diplopia is worse with movements.     She went to the ED and had a Morphine shot when pain was worse and also had IV fluids.     MS:   She is back on the Ocrevus.   She thinks her MS is probably stable but is concerned that the vertigo might be a new exacerbation.Marland Kitchen    She was on Tysabri and was stable but converted to JCV Ab positive and stopped.   She could not tolerate Tecfidera.        Gait/strength :    Her gait is very poor and she mostly uses a scooter. She can use a walker for a few steps (probably up to 20 feet).     She has left > right leg weakness and spasticity.       Dysesthesia/hip pain:   She continues to report burning pain in the right leg and right flank lamotrigine has only helps the pain a little bit.    Tegretol and gabapentin have not helped much.   TCA's have not helped.     Baclofen made her feel weaker and she stopped.     Nucynta is too expensive.   Tramadol has not helped much   Bladder/bowel:  She has a difficult bladder dysfunction with frequent incontinence. She has quite  a bit of hesitancy, worse when she uses the opiates (now off of these).    Tamsulosin did not really help the hesitancy. Desmopressin helps her to the bed at night to help her more. She still has frequent incontinence.     She reports constipation. Linzess did not help.    Nucynta does not worsen constipation like opiates did but is very expensive.  Fatigue/sleep:   She has fatigue most days, worse with heat. Adderall has helped.   She does well doing pool exercises.,  When fatiue is more severe she takes  Solu-Medrol infusions.   She has both sleep onset and sleep maintenance insomnia.   She takes clonazepam 1 mg and Ambien every night due to difficulty falling and staying asleep. Clonazepam also helps her leg spasms as well as helps her to fall asleep.   Initially, does note progressive help her sleep but now she is having a lot of  incontinence again for   Mood/cognition:   She notes mild depression and anxiety.    She prefers not to take an antidepressant.    She rarely takes Xanax 0.5 mg when she has more anxiety. She notes more trouble with cognition, especially short term memory.    Adderall helps focus some    MS History:  She presented with optic neuritis in 1991 followed shortly by difficulties with leg weakness and by right trigeminal neuralgia. In retrospect, couple years earlier when she had her daughter, has some difficulties with her legs and needed to go on short-term disability. At first she was not diagnosed with MS but after more of the symptoms she underwent MRI testing and had a lumbar puncture by Dr. Johnnye Sima. The imaging and the CSF was consistent with multiple sclerosis. When Betaseron became available she was placed on that. She was on Betaseron for about 10 years. She felt that she did not have too many exacerbations during that time but she had a lot of difficulty tolerating the Betaseron due to skin reactions. Around 12-15 years ago, she started to use a cane and she has had progressive gait disturbance over the last decade. Around the house for short distances, she uses a walker but uses her scooter for longer distances. Outside she uses a wheelchair pushed by others.    REVIEW OF SYSTEMS:  Constitutional: No fevers, chills, sweats, or change in appetite.  She has Fatigue Eyes: No visual changes, double vision, eye pain Ear, nose and throat: No hearing loss, ear pain, nasal congestion, sore throat Cardiovascular: No chest pain, palpitations Respiratory:  No shortness of breath at rest or with exertion.   No wheezes GastrointestinaI: Mild dysphagia.  Constipation.  No nausea, vomiting, diarrhea.   Genitourinary:  No dysuria but has urinary retention and frequency.  Desmopressin helps nocturia. Musculoskeletal:  No neck pain,   Has lower back pain and buttocks.  Hips ans other joints ok Integumentary: No  rash, pruritus, skin lesions Neurological: as above Psychiatric: see above Endocrine: No palpitations, diaphoresis, change in appetite, change in weigh or increased thirst Hematologic/Lymphatic:  No anemia, purpura, petechiae. Allergic/Immunologic: No itchy/runny eyes, nasal congestion, recent allergic reactions, rashes  ALLERGIES: Allergies  Allergen Reactions  . Codeine Nausea And Vomiting    HOME MEDICATIONS: Outpatient Medications Prior to Visit  Medication Sig Dispense Refill  . acetaminophen (TYLENOL) 500 MG tablet Take 1,000 mg by mouth daily as needed for pain.    . Cholecalciferol (VITAMIN D PO) Take 1 tablet by mouth daily.    Marland Kitchen  clonazePAM (KLONOPIN) 1 MG tablet Take 1 tablet (1 mg total) by mouth at bedtime as needed. 30 tablet 3  . desmopressin (DDAVP) 0.1 MG tablet Take 1 tablet (0.1 mg total) by mouth at bedtime as needed (bladder.). 30 tablet 11  . diazepam (VALIUM) 5 MG tablet TAKE 1 TABLET BY MOUTH EVERY 8 HOURS AS NEEDED 90 tablet 5  . diclofenac (VOLTAREN) 75 MG EC tablet Take 1 tablet (75 mg total) by mouth 2 (two) times daily. Do not take with ibuprofen 60 tablet 3  . estradiol (CLIMARA - DOSED IN MG/24 HR) 0.1 mg/24hr patch Place 0.1 mg onto the skin once a week.  5  . gabapentin (NEURONTIN) 800 MG tablet TAKE 1 TABLET(800 MG) BY MOUTH FOUR TIMES DAILY 120 tablet 11  . ibuprofen (ADVIL,MOTRIN) 200 MG tablet Take 400 mg by mouth every 6 (six) hours as needed for headache (headache).    . lamoTRIgine (LAMICTAL) 200 MG tablet TAKE 1 TABLET BY MOUTH THREE TIMES DAILY 270 tablet 3  . lidocaine (LIDODERM) 5 % Place 1 patch daily onto the skin. Remove & Discard patch within 12 hours or as directed by MD 30 patch 11  . meclizine (ANTIVERT) 25 MG tablet Take 1 tablet (25 mg total) by mouth 3 (three) times daily as needed for dizziness. 30 tablet 0  . methylPREDNISolone (MEDROL DOSEPAK) 4 MG TBPK tablet Take 6 tablets on day 1 and decrease by 1 tablet each day until finished  21 tablet 0  . omeprazole (PRILOSEC) 40 MG capsule Take 40 mg by mouth 2 (two) times daily.    . ondansetron (ZOFRAN) 4 MG tablet Take 1 tablet (4 mg total) by mouth every 8 (eight) hours as needed for nausea or vomiting. 21 tablet 3  . promethazine (PHENERGAN) 25 MG tablet Take 1 tablet (25 mg total) by mouth every 6 (six) hours as needed for nausea (nausea). 30 tablet 3  . sertraline (ZOLOFT) 50 MG tablet Take 1 tablet (50 mg total) by mouth daily. 30 tablet 11  . sulfamethoxazole-trimethoprim (BACTRIM DS) 800-160 MG tablet Take 1 tablet by mouth twice daily for 7 days 14 tablet 0  . traMADol (ULTRAM) 50 MG tablet TAKE 1 TABLET BY MOUTH FOUR TIMES DAILY AS NEEDED FOR MODERATE PAIN 120 tablet 5  . zolpidem (AMBIEN) 10 MG tablet Take 1 tablet (10 mg total) by mouth at bedtime as needed. for sleep 30 tablet 5  . amphetamine-dextroamphetamine (ADDERALL) 20 MG tablet Take 1 tablet (20 mg total) by mouth 2 (two) times daily. 60 tablet 0   No facility-administered medications prior to visit.    PAST MEDICAL HISTORY: Past Medical History:  Diagnosis Date  . Anxiety   . Depression   . Gait instability    uses wheelchair or assistance  . H/O steroid therapy    IV infusion every 6 to 8 weeks- last 05-31-14 [facility]  . History of breast biopsy   . Multiple sclerosis ([place])   . Neuromuscular disorder ([place])    MS  . PONV (postoperative nausea and vomiting)   . Skin cancer     PAST SURGICAL HISTORY: Past Surgical History:  Procedure Laterality Date  . ABDOMINAL HYSTERECTOMY    . BREAST BIOPSY Right 09/20/2012   Procedure: BREAST BIOPSY WITH NEEDLE LOCALIZATION;  Surgeon: Rolm Bookbinder, MD;  Location: [facility];  Service: General;  Laterality: Right;  . BREAST EXCISIONAL BIOPSY Right    benign  . CESAREAN SECTION    . COLONOSCOPY  WITH PROPOFOL N/A 07/13/2014   Procedure: COLONOSCOPY WITH PROPOFOL;  Surgeon: Beryle Beams, MD;  Location: [facility];  Service:  Endoscopy;  Laterality: N/A;  . DEBRIDEMENT SKIN / SQ / MUSCLE OF ARM Right 07/13/2003   I & D; debridement of tissue within triceps muscle  . LEG SURGERY     sx as a child  . MELANOMA EXCISION    . PARTIAL HYSTERECTOMY    . TONSILLECTOMY     as a child    FAMILY HISTORY: Family History  Problem Relation Age of Onset  . Hyperlipidemia Mother   . Hypertension Mother   . Hypertension Father   . Diabetes type II Father     SOCIAL HISTORY:  Social History   Socioeconomic History  . Marital status: Married    Spouse name: Not on file  . Number of children: Not on file  . Years of education: Not on file  . Highest education level: Not on file  Occupational History  . Not on file  Tobacco Use  . Smoking status: Never Smoker  . Smokeless tobacco: Never Used  Substance and Sexual Activity  . Alcohol use: Not Currently    Comment: social  . Drug use: No  . Sexual activity: Not on file  Other Topics Concern  . Not on file  Social History Narrative   Lives at home w/ her husband   Social Determinants of Health   Financial Resource Strain:   . Difficulty of Paying Living Expenses: Not on file  Food Insecurity:   . Worried About Charity fundraiser in the Last Year: Not on file  . Ran Out of Food in the Last Year: Not on file  Transportation Needs:   . Lack of Transportation (Medical): Not on file  . Lack of Transportation (Non-Medical): Not on file  Physical Activity:   . Days of Exercise per Week: Not on file  . Minutes of Exercise per Session: Not on file  Stress:   . Feeling of Stress : Not on file  Social Connections:   . Frequency of Communication with Friends and Family: Not on file  . Frequency of Social Gatherings with Friends and Family: Not on file  . Attends Religious Services: Not on file  . Active Member of Clubs or Organizations: Not on file  . Attends Archivist Meetings: Not on file  . Marital Status: Not on file  Intimate Partner  Violence:   . Fear of Current or Ex-Partner: Not on file  . Emotionally Abused: Not on file  . Physically Abused: Not on file  . Sexually Abused: Not on file     PHYSICAL EXAM  Vitals:   08/03/19 1314  BP: 95/65  Pulse: 74  Temp: (!) 97 F (36.1 C)  Height: 5' 5"  (1.651 m)    Body mass index is 23.18 kg/m.   General: The patient is well-developed and well-nourished and in no acute distress   Skin/Ext/Musculoskeletal: She has pedal edema.   Tender over the trochanteric bursa on the right.   She has tenderness over the left splenius capitis and splenius cervicis muscles and to a lesser extent the trapezius and rhomboid muscles   Neurologic Exam  Mental status: The patient is alert and oriented x 3 at the time of the examination. The patient has apparent normal recent and remote memory, with an apparently normal attention span and concentration ability.   Speech is normal.  Cranial nerves: Extraocular movements are full.  Facial strength and sensation was normal.  Trapezius strength is normal.. No dysarthria is noted.  No obvious hearing deficits are noted.  Motor:  Muscle bulk and tone are normal.  Strength is 2+/5 left and 3/5 right proximal leg strength.  Strength is 4- to 4/5 distally.  The arms are fairly strong.  Sensory: She has reduced touch sensation in the left leg..  Coordination: Cerebellar testing reveals reduced finger-nose-finger and poor heel-to-shin bilaterally.  Gait and station: She needs support to stand.  She cannot walk.  Reflexes: Deep tendon reflexes are symmetric and increased in legs bilaterally with spread at the knees.  She has non-sustained ankle clonus bilaterally.    DIAGNOSTIC DATA (LABS, IMAGING, TESTING) - I reviewed patient records, labs, notes, testing and imaging myself where available.  Lab Results  Component Value Date   WBC 8.5 06/07/2019   HGB 13.9 06/07/2019   HCT 40.8 06/07/2019   MCV 91 06/07/2019   PLT 271 06/07/2019       Component Value Date/Time   NA 144 06/07/2019 1519   K 5.2 06/07/2019 1519   CL 100 06/07/2019 1519   CO2 27 06/07/2019 1519   GLUCOSE 95 06/07/2019 1519   GLUCOSE 104 (H) 03/07/2018 1138   BUN 15 06/07/2019 1519   CREATININE 0.70 06/07/2019 1519   CALCIUM 10.0 06/07/2019 1519   PROT 6.9 06/07/2019 1519   ALBUMIN 4.9 06/07/2019 1519   AST 19 06/07/2019 1519   ALT 13 06/07/2019 1519   ALKPHOS 101 06/07/2019 1519   BILITOT 0.2 06/07/2019 1519   GFRNONAA 94 06/07/2019 1519   GFRAA 109 06/07/2019 1519      ASSESSMENT AND PLAN  Multiple sclerosis (HCC)  Dysesthesia  Weakness of both lower extremities  Urinary dysfunction   1.   Continue physical therapy and try to exercise as tolerated.   2.  Continue  Adderall for fatigue, focus/attention and Zoloft for depression   3.   Clonazepam nightly spasticity and insomnia.   4.  Continue gabapentin and lamotrigine 5.   she will return to see me in 4 months or sooner if she has new or worsening neurologic symptoms.    Joleigh Mineau A. Felecia Shelling, MD, PhD 03/07/2425, 8:34 PM Certified in Neurology, Clinical Neurophysiology, Sleep Medicine, Pain Medicine and Neuroimaging  [facility] [address] [place], [place] [address] [phone]

## 2019-08-07 ENCOUNTER — Telehealth: Payer: Self-pay | Admitting: Neurology

## 2019-08-07 DIAGNOSIS — M79604 Pain in right leg: Secondary | ICD-10-CM | POA: Diagnosis not present

## 2019-08-07 DIAGNOSIS — S82819A Torus fracture of upper end of unspecified fibula, initial encounter for closed fracture: Secondary | ICD-10-CM | POA: Diagnosis not present

## 2019-08-07 NOTE — Telephone Encounter (Signed)
Called pt back. She had physical with PCP a couple weeks ago. She was referred to GI for acid reflux. She was also referred to vascular and vein specialist for constant cold feet and they also get hot at night. She is wondering if Dr. Felecia Shelling feels this is MS related or not. She is unsure if she should go to this appt or not. Advised I would recommend she go to this appt. Could be peripheral vascular disease and they can evaluate/treat for this. She wants to know Dr. Felecia Shelling opinion. Advised  I will send to MD and call her back.

## 2019-08-07 NOTE — Telephone Encounter (Signed)
I am not sure what is causing her cold feet.  It might be good for her to be evaluated by the vascular surgeon to make sure that there is not a vascular issue.  This is not a typical MS symptoms

## 2019-08-07 NOTE — Telephone Encounter (Signed)
I called and spoke with pt. Relayed Dr. Garth Bigness message. She verbalized understanding. She will make appt with vein/vascular specialist. Nothing further needed.

## 2019-08-07 NOTE — Telephone Encounter (Signed)
Patient is requesting a call back, patient wouldn't disclose why

## 2019-08-08 DIAGNOSIS — Z86010 Personal history of colonic polyps: Secondary | ICD-10-CM | POA: Diagnosis not present

## 2019-08-08 DIAGNOSIS — R1310 Dysphagia, unspecified: Secondary | ICD-10-CM | POA: Diagnosis not present

## 2019-08-08 DIAGNOSIS — R933 Abnormal findings on diagnostic imaging of other parts of digestive tract: Secondary | ICD-10-CM | POA: Diagnosis not present

## 2019-08-08 DIAGNOSIS — K219 Gastro-esophageal reflux disease without esophagitis: Secondary | ICD-10-CM | POA: Diagnosis not present

## 2019-08-14 ENCOUNTER — Telehealth: Payer: Self-pay | Admitting: Neurology

## 2019-08-14 NOTE — Telephone Encounter (Signed)
I called pt back. Vertigo has returned last Thursday. This is intermittent. Oral meds ineffective. Hard for her to get around right now. She is aware that Dr. Felecia Shelling did not want to give steroids d/t broke leg. She has been to ortho MD since and was told she was healing well. She was told ankle sprain can swell for up to 6 months to a year. She is wanting to come in for IV steroids. Advised I will send message to MD and call her back

## 2019-08-14 NOTE — Telephone Encounter (Signed)
Since she is healing well we can do one day IV SOlumedrol one gram

## 2019-08-14 NOTE — Telephone Encounter (Signed)
Order pending MD signature.

## 2019-08-14 NOTE — Telephone Encounter (Signed)
Pt has called stating since Thursday of last week she has had vertigo and it is affecting her MS.  Pt would like to come in for an infusion, please call

## 2019-08-14 NOTE — Telephone Encounter (Signed)
Gave signed orders to intrafusion. They will call pt to get scheduled.

## 2019-08-15 ENCOUNTER — Telehealth: Payer: Self-pay | Admitting: *Deleted

## 2019-08-15 DIAGNOSIS — R42 Dizziness and giddiness: Secondary | ICD-10-CM

## 2019-08-15 DIAGNOSIS — G35 Multiple sclerosis: Secondary | ICD-10-CM | POA: Diagnosis not present

## 2019-08-15 NOTE — Telephone Encounter (Signed)
Pt here today for infusion. She is requesting referral to ENT. She wants to see if she qualifies for gentamicin injection for Meniere's disease. Dr. Felecia Shelling approved this, referral placed for vertigo. Pt aware.

## 2019-08-21 ENCOUNTER — Other Ambulatory Visit: Payer: Self-pay | Admitting: Neurology

## 2019-08-26 ENCOUNTER — Other Ambulatory Visit: Payer: Self-pay | Admitting: Neurology

## 2019-08-28 ENCOUNTER — Telehealth: Payer: Self-pay

## 2019-08-28 MED ORDER — ONDANSETRON HCL 4 MG PO TABS: 4.0000 mg | ORAL_TABLET | Freq: Three times a day (TID) | ORAL | 3 refills | Status: DC | PRN

## 2019-08-28 NOTE — Telephone Encounter (Signed)
Pt left a voicemail for office to call her back but did not give any other details.  I called pt back. She is having vertigo. She has tried phenergan, meclizine, and zofran for vertigo in the past. She is wondering if Dr. Felecia Shelling is agreeable to refilling the zofran for her. Last zofran RX was in 2019 and her pharmacy will not refill until they receive new RX. Pt is using [facility] at [address].

## 2019-08-28 NOTE — Telephone Encounter (Signed)
Spoke with Dr. Felecia Shelling. He approved refilling zofran for pt. Escribed rx to pharmacy.

## 2019-08-31 ENCOUNTER — Telehealth (HOSPITAL_COMMUNITY): Payer: Self-pay

## 2019-08-31 NOTE — Telephone Encounter (Signed)

## 2019-09-04 ENCOUNTER — Other Ambulatory Visit: Payer: Self-pay

## 2019-09-04 ENCOUNTER — Ambulatory Visit (HOSPITAL_COMMUNITY)
Admission: RE | Admit: 2019-09-04 | Discharge: 2019-09-04 | Disposition: A | Discharge status: Home / Self Care | Payer: Medicare Other | Source: Ambulatory Visit | Attending: Surgery | Admitting: Surgery

## 2019-09-04 ENCOUNTER — Encounter: Payer: Self-pay | Admitting: Surgery

## 2019-09-04 ENCOUNTER — Ambulatory Visit (INDEPENDENT_AMBULATORY_CARE_PROVIDER_SITE_OTHER): Payer: Medicare Other | Admitting: Surgery

## 2019-09-04 VITALS — BP 144/84 | HR 66 | Temp 97.9°F | Resp 20 | Ht 65.0 in | Wt 139.0 lb

## 2019-09-04 DIAGNOSIS — I739 Peripheral vascular disease, unspecified: Secondary | ICD-10-CM | POA: Diagnosis not present

## 2019-09-04 DIAGNOSIS — G35 Multiple sclerosis: Secondary | ICD-10-CM | POA: Diagnosis not present

## 2019-09-04 DIAGNOSIS — S82831D Other fracture of upper and lower end of right fibula, subsequent encounter for closed fracture with routine healing: Secondary | ICD-10-CM | POA: Diagnosis not present

## 2019-09-04 NOTE — Progress Notes (Signed)
[facility] [place]  Patient name: Tonya Powers MRN: [id number] DOB: [birth year] Sex: female   REQUESTING PROVIDER:    Serita Grammes   REASON FOR CONSULT:    Prolonged capillary refill  HISTORY OF PRESENT ILLNESS:   Tonya Powers is a 59 y.o. female, who is referred today for arterial evaluation because he was found to have prolonged capillary refill as well as discolored lower extremities and antibiotics.  The patient does suffer from [place].  She is in a scooter.  She has difficulty with the function of her left leg.  She is recently getting over a bout of vertigo.  The patient continues to complain with neuropathy.  The left foot bothers her more than the right.  They do cause a burning sensation.  This is exacerbated by being in her scooter most of the day with her legs down.  PAST MEDICAL HISTORY    Past Medical History:  Diagnosis Date  . Anxiety   . Depression   . Gait instability    uses wheelchair or assistance  . H/O steroid therapy    IV infusion every 6 to 8 weeks- last 05-31-14 [facility]  . History of breast biopsy   . Multiple sclerosis ([place])   . Neuromuscular disorder ([place])    MS  . PONV (postoperative nausea and vomiting)   . Skin cancer      FAMILY HISTORY   Family History  Problem Relation Age of Onset  . Hyperlipidemia Mother   . Hypertension Mother   . Hypertension Father   . Diabetes type II Father     SOCIAL HISTORY:   Social History   Socioeconomic History  . Marital status: Married    Spouse name: Not on file  . Number of children: Not on file  . Years of education: Not on file  . Highest education level: Not on file  Occupational History  . Not on file  Tobacco Use  . Smoking status: Never Smoker  . Smokeless tobacco: Never Used  Substance and Sexual Activity  . Alcohol use: Not Currently    Comment: social  . Drug use: No  . Sexual activity: Not on file    Other Topics Concern  . Not on file  Social History Narrative   Lives at home w/ her husband   Social Determinants of Health   Financial Resource Strain:   . Difficulty of Paying Living Expenses: Not on file  Food Insecurity:   . Worried About Charity fundraiser in the Last Year: Not on file  . Ran Out of Food in the Last Year: Not on file  Transportation Needs:   . Lack of Transportation (Medical): Not on file  . Lack of Transportation (Non-Medical): Not on file  Physical Activity:   . Days of Exercise per Week: Not on file  . Minutes of Exercise per Session: Not on file  Stress:   . Feeling of Stress : Not on file  Social Connections:   . Frequency of Communication with Friends and Family: Not on file  . Frequency of Social Gatherings with Friends and Family: Not on file  . Attends Religious Services: Not on file  . Active Member of Clubs or Organizations: Not on file  . Attends Archivist Meetings: Not on file  . Marital Status: Not on file  Intimate Partner Violence:   . Fear of Current or Ex-Partner: Not on file  . Emotionally Abused: Not on  file  . Physically Abused: Not on file  . Sexually Abused: Not on file    ALLERGIES:    Allergies  Allergen Reactions  . Codeine Nausea And Vomiting    CURRENT MEDICATIONS:    Current Outpatient Medications  Medication Sig Dispense Refill  . acetaminophen (TYLENOL) 500 MG tablet Take 1,000 mg by mouth daily as needed for pain.    Marland Kitchen amphetamine-dextroamphetamine (ADDERALL) 20 MG tablet Take 1 tablet (20 mg total) by mouth 2 (two) times daily. 60 tablet 0  . Cholecalciferol (VITAMIN D PO) Take 1 tablet by mouth daily.    . clonazePAM (KLONOPIN) 1 MG tablet TAKE 1 TABLET(1 MG) BY MOUTH AT BEDTIME AS NEEDED 30 tablet 3  . desmopressin (DDAVP) 0.1 MG tablet Take 1 tablet (0.1 mg total) by mouth at bedtime as needed (bladder.). 30 tablet 11  . diazepam (VALIUM) 5 MG tablet TAKE 1 TABLET BY MOUTH EVERY 8 HOURS AS  NEEDED 90 tablet 5  . diclofenac (VOLTAREN) 75 MG EC tablet Take 1 tablet (75 mg total) by mouth 2 (two) times daily. Do not take with ibuprofen 60 tablet 3  . estradiol (CLIMARA - DOSED IN MG/24 HR) 0.1 mg/24hr patch Place 0.1 mg onto the skin once a week.  5  . gabapentin (NEURONTIN) 800 MG tablet TAKE 1 TABLET(800 MG) BY MOUTH FOUR TIMES DAILY 120 tablet 11  . lamoTRIgine (LAMICTAL) 200 MG tablet TAKE 1 TABLET BY MOUTH THREE TIMES DAILY 270 tablet 3  . lidocaine (LIDODERM) 5 % Place 1 patch daily onto the skin. Remove & Discard patch within 12 hours or as directed by MD 30 patch 11  . meclizine (ANTIVERT) 25 MG tablet Take 1 tablet (25 mg total) by mouth 3 (three) times daily as needed for dizziness. 30 tablet 0  . omeprazole (PRILOSEC) 40 MG capsule Take 40 mg by mouth 2 (two) times daily.    . ondansetron (ZOFRAN) 4 MG tablet Take 1 tablet (4 mg total) by mouth every 8 (eight) hours as needed for nausea or vomiting. 21 tablet 3  . promethazine (PHENERGAN) 25 MG tablet Take 1 tablet (25 mg total) by mouth every 6 (six) hours as needed for nausea (nausea). 30 tablet 3  . sertraline (ZOLOFT) 50 MG tablet TAKE 1 TABLET(50 MG) BY MOUTH DAILY 30 tablet 11  . traMADol (ULTRAM) 50 MG tablet TAKE 1 TABLET BY MOUTH FOUR TIMES DAILY AS NEEDED FOR MODERATE PAIN 120 tablet 5  . zolpidem (AMBIEN) 10 MG tablet Take 1 tablet (10 mg total) by mouth at bedtime as needed. for sleep 30 tablet 5  . ibuprofen (ADVIL,MOTRIN) 200 MG tablet Take 400 mg by mouth every 6 (six) hours as needed for headache (headache).    . methylPREDNISolone (MEDROL DOSEPAK) 4 MG TBPK tablet Take 6 tablets on day 1 and decrease by 1 tablet each day until finished (Patient not taking: Reported on 09/04/2019) 21 tablet 0   No current facility-administered medications for this visit.    REVIEW OF SYSTEMS:   [X]  denotes positive finding, [ ]  denotes negative finding Cardiac  Comments:  Chest pain or chest pressure:    Shortness of breath  upon exertion:    Short of breath when lying flat:    Irregular heart rhythm:        Vascular    Pain in calf, thigh, or hip brought on by ambulation:    Pain in feet at night that wakes you up from your sleep:  Blood clot in your veins:    Leg swelling:  x       Pulmonary    Oxygen at home:    Productive cough:     Wheezing:         Neurologic    Sudden weakness in arms or legs:     Sudden numbness in arms or legs:     Sudden onset of difficulty speaking or slurred speech:    Temporary loss of vision in one eye:     Problems with dizziness:         Gastrointestinal    Blood in stool:      Vomited blood:         Genitourinary    Burning when urinating:     Blood in urine:        Psychiatric    Major depression:         Hematologic    Bleeding problems:    Problems with blood clotting too easily:        Skin    Rashes or ulcers:        Constitutional    Fever or chills:     PHYSICAL EXAM:   Vitals:   09/04/19 1404  BP: (!) 144/84  Pulse: 66  Resp: 20  Temp: 97.9 F (36.6 C)  SpO2: 99%  Weight: 139 lb (63 kg)  Height: 5\' 5"  (1.651 m)    GENERAL: The patient is a well-nourished female, in no acute distress. The vital signs are documented above. CARDIAC: There is a regular rate and rhythm.  VASCULAR: Dependent rubor which is very pronounced in both feet.  Pedal pulses are nonpalpable.  1-2+ pitting edema bilaterally. PULMONARY: Nonlabored respirations NEUROLOGIC: No focal weakness or paresthesias are detected. SKIN: There are no ulcers or rashes noted. PSYCHIATRIC: The patient has a normal affect.  STUDIES:   I have reviewed the following: +-------+-----------+-----------+------------+------------+  ABI/TBIToday's ABIToday's TBIPrevious ABIPrevious TBI  +-------+-----------+-----------+------------+------------+  Right 1.18    0.86                  +-------+-----------+-----------+------------+------------+  Left   1.19    0.84                  +-------+-----------+-----------+------------+------------+   Right toe:  140 Left toe: 137   ASSESSMENT and PLAN   Prolonged capillary refill: The patient had a normal ABI and toe pressures today.  I do not think vascular insufficiency is the etiology for her leg discoloration and prolonged capillary refill.  I suspect this is related to autonomic dysfunction from her MS.  With regards to her leg swelling we talked about compression and elevation.  She is unable to tolerate compression because of the sensitivity of her skin from her MS.  She does try to keep her legs elevated when possible.   Leia Alf, MD, FACS [facility] [phone] Pager [phone]

## 2019-09-05 DIAGNOSIS — G35 Multiple sclerosis: Secondary | ICD-10-CM | POA: Diagnosis not present

## 2019-09-05 DIAGNOSIS — R42 Dizziness and giddiness: Secondary | ICD-10-CM | POA: Diagnosis not present

## 2019-09-07 ENCOUNTER — Other Ambulatory Visit: Payer: Self-pay | Admitting: Neurology

## 2019-09-11 ENCOUNTER — Other Ambulatory Visit: Payer: Self-pay | Admitting: *Deleted

## 2019-09-11 ENCOUNTER — Telehealth: Payer: Self-pay | Admitting: Neurology

## 2019-09-11 DIAGNOSIS — R531 Weakness: Secondary | ICD-10-CM

## 2019-09-11 DIAGNOSIS — G35 Multiple sclerosis: Secondary | ICD-10-CM

## 2019-09-11 DIAGNOSIS — R42 Dizziness and giddiness: Secondary | ICD-10-CM

## 2019-09-11 DIAGNOSIS — W19XXXD Unspecified fall, subsequent encounter: Secondary | ICD-10-CM

## 2019-09-11 MED ORDER — MECLIZINE HCL 25 MG PO TABS: 25.0000 mg | ORAL_TABLET | Freq: Three times a day (TID) | ORAL | 0 refills | Status: DC | PRN

## 2019-09-11 NOTE — Telephone Encounter (Signed)
Called pt. Advised I spoke further with Dr. Felecia Shelling and he does not have many other options for vertigo. He would like her to keep appt with Audiologist next week for further evaluation. Advised her to continue meclizine. She needed refill, I escribed this to [facility].  She is agreeable to referral to home health PT ([facility]). Referral placed. She will call back if any further questions/concerns.   She also asked for refill on clonazepam. Last refill sent to [facility] 08/28/19 #30, 3 refills.

## 2019-09-11 NOTE — Telephone Encounter (Signed)
Pt states she is having a rough go with her Vertigo and would like a call from RN to discuss

## 2019-09-11 NOTE — Addendum Note (Signed)
Addended by: Wyvonnia Lora on: 09/11/2019 05:30 PM   Modules accepted: Orders

## 2019-09-11 NOTE — Telephone Encounter (Signed)
Called pt back. She went and saw ENT.  Dr. Wilburn Cornelia could not determine what is causing vertigo. She is scheduled to see audiologist on 09/22/19.Epley maneuver ineffective per pt. She has not driven since last visit 08/03/19 d/t vertigo being too severe. Received 1 day IV steroid 1G 08/15/19. She woke up today with vertigo but she is not nauseous today. She is taking zofran/meclizine prn. She is wanting to know what Dr. Felecia Shelling recommends at this point.   FYI-she has also has seen vein/vascular.

## 2019-09-11 NOTE — Telephone Encounter (Signed)
Continue taking the meclizine prn.  Has any other medication worked better for her in the past?

## 2019-09-15 ENCOUNTER — Telehealth: Payer: Self-pay | Admitting: Neurology

## 2019-09-15 NOTE — Telephone Encounter (Signed)
Tharon Aquas from [facility] called stating the pt has declined PT until Monday the 22nd.

## 2019-09-15 NOTE — Telephone Encounter (Signed)
noted 

## 2019-09-18 DIAGNOSIS — Z993 Dependence on wheelchair: Secondary | ICD-10-CM | POA: Diagnosis not present

## 2019-09-18 DIAGNOSIS — R42 Dizziness and giddiness: Secondary | ICD-10-CM | POA: Diagnosis not present

## 2019-09-18 DIAGNOSIS — Z79891 Long term (current) use of opiate analgesic: Secondary | ICD-10-CM | POA: Diagnosis not present

## 2019-09-18 DIAGNOSIS — R39198 Other difficulties with micturition: Secondary | ICD-10-CM | POA: Diagnosis not present

## 2019-09-18 DIAGNOSIS — G35 Multiple sclerosis: Secondary | ICD-10-CM | POA: Diagnosis not present

## 2019-09-18 DIAGNOSIS — M549 Dorsalgia, unspecified: Secondary | ICD-10-CM | POA: Diagnosis not present

## 2019-09-18 DIAGNOSIS — Z8781 Personal history of (healed) traumatic fracture: Secondary | ICD-10-CM | POA: Diagnosis not present

## 2019-09-18 DIAGNOSIS — G629 Polyneuropathy, unspecified: Secondary | ICD-10-CM | POA: Diagnosis not present

## 2019-09-18 DIAGNOSIS — F419 Anxiety disorder, unspecified: Secondary | ICD-10-CM | POA: Diagnosis not present

## 2019-09-18 DIAGNOSIS — K224 Dyskinesia of esophagus: Secondary | ICD-10-CM | POA: Diagnosis not present

## 2019-09-18 DIAGNOSIS — F329 Major depressive disorder, single episode, unspecified: Secondary | ICD-10-CM | POA: Diagnosis not present

## 2019-09-18 DIAGNOSIS — Z9181 History of falling: Secondary | ICD-10-CM | POA: Diagnosis not present

## 2019-09-19 ENCOUNTER — Telehealth: Payer: Self-pay | Admitting: Neurology

## 2019-09-19 NOTE — Telephone Encounter (Signed)
Called back, advised Dr. Felecia Shelling approved below request. She verbalized understanding, no further requests.

## 2019-09-19 NOTE — Telephone Encounter (Signed)
Physical Therapist Frankie @ [facility] has called for verbal orders for PT for 2 week 2 and 1 week 7

## 2019-09-21 ENCOUNTER — Telehealth: Payer: Self-pay | Admitting: Neurology

## 2019-09-21 NOTE — Telephone Encounter (Signed)
Called pt back. Relayed Dr. Garth Bigness message. She verbalized understanding. She has appt with audiologist this Friday for f/u on vertigo. She will let us know once she schedules for covid-19 vaccine

## 2019-09-21 NOTE — Telephone Encounter (Signed)
Pt would like to know if Dr Felecia Shelling will suggest she takes the Covid-19 vaccine, please call.

## 2019-09-21 NOTE — Telephone Encounter (Signed)
Yes, I recommend proceeding with vaccination

## 2019-09-21 NOTE — Telephone Encounter (Signed)
Called pt back and informed her per Dr. Felecia Shelling that she can receive any of the three covid-19 vaccines available.  She is concerned because when had shingles vaccine, she had worsening sx of her vertigo for 2 weeks after. She is still doing physical therapy for vertigo. She wants to know if Dr. Felecia Shelling still recommends vaccine given her hx. Advised I will check with him and call her back.

## 2019-09-22 DIAGNOSIS — R42 Dizziness and giddiness: Secondary | ICD-10-CM | POA: Diagnosis not present

## 2019-09-27 ENCOUNTER — Other Ambulatory Visit: Payer: Self-pay | Admitting: *Deleted

## 2019-09-27 ENCOUNTER — Telehealth: Payer: Self-pay | Admitting: Neurology

## 2019-09-27 MED ORDER — PROMETHAZINE HCL 25 MG PO TABS: 25.0000 mg | ORAL_TABLET | Freq: Four times a day (QID) | ORAL | 3 refills | Status: AC | PRN

## 2019-09-27 NOTE — Telephone Encounter (Addendum)
Called pt, she states she takes phenergan 25mg  (1/2 tab) for severe episodes of vertigo and does not take with meclizine. Takes meclizine for less severe episodes of vertigo. I placed on hold and spoke with Dr. Felecia Shelling. He approved refill for phenergen 25mg  #30, 3 refills for more severe episodes. I relayed to pt and e-scribed rx to [facility]. She has started PT for vertigo and is doing this twice a week for six weeks. She will call back if any further questions or concerns.

## 2019-09-27 NOTE — Telephone Encounter (Signed)
Spoke with Dr. Felecia Shelling. He only wants her on one or the other. They are similar medications. He wants to know which one helps her better and she can continue taking that one.

## 2019-09-27 NOTE — Telephone Encounter (Signed)
Pt has called asking for a refill on her meclizine (ANTIVERT) 25 MG tablet.  Pt was told this was called in on 03-15 to her [facility] [id number]  Pt will f/u with them.  Pt also stated she would like to have promethazine (PHENERGAN) 25 MG tablet called in for her because it has helped her in the past.  Pt was asked about r/s her June f/u.  Pt said she is unable to r/s at this time but will call back to do so, please call

## 2019-09-29 ENCOUNTER — Telehealth: Payer: Self-pay | Admitting: Neurology

## 2019-09-29 NOTE — Telephone Encounter (Signed)
Tonya Powers called to report an unwitness fall.  [name] stated pt informed her she had been having an increase of vertigo, pt fell between toilet and power chair, pt had to call husband from work to come home to help her up. No bruises yet, pt did fall on right hip.  If need be [name] can be reached at [phone]

## 2019-10-05 ENCOUNTER — Telehealth: Payer: Self-pay | Admitting: Neurology

## 2019-10-05 ENCOUNTER — Ambulatory Visit: Payer: Medicare Other

## 2019-10-05 NOTE — Telephone Encounter (Signed)
Called pt back. She will try and take meclizine po TID, she has only been taking prn. She will let us know if this is beneficial. She will continue PT exercises for vertigo.Tonya Powers

## 2019-10-05 NOTE — Telephone Encounter (Signed)
Patient called stating she isnt able to get rid of her vertigo and wanted to know if MD had any other suggestions to what she could do

## 2019-10-05 NOTE — Telephone Encounter (Signed)
She is already on valium ... she can try to go up on the meclizine to tid

## 2019-10-05 NOTE — Telephone Encounter (Signed)
Dr. Felecia Shelling- please advise. You have tried medication, she has seen ENT and doing PT for vertigo.

## 2019-10-10 ENCOUNTER — Other Ambulatory Visit: Payer: Self-pay | Admitting: Neurology

## 2019-10-16 ENCOUNTER — Other Ambulatory Visit: Payer: Self-pay | Admitting: Neurology

## 2019-10-16 DIAGNOSIS — M79602 Pain in left arm: Secondary | ICD-10-CM | POA: Diagnosis not present

## 2019-10-16 MED ORDER — AMPHETAMINE-DEXTROAMPHETAMINE 20 MG PO TABS: 20.0000 mg | ORAL_TABLET | Freq: Two times a day (BID) | ORAL | 0 refills | Status: DC

## 2019-10-16 NOTE — Telephone Encounter (Signed)
Pt called needing a refill on her amphetamine-dextroamphetamine (ADDERALL) 20 MG tablet sent in to the [facility] on [address]

## 2019-10-18 DIAGNOSIS — Z79891 Long term (current) use of opiate analgesic: Secondary | ICD-10-CM | POA: Diagnosis not present

## 2019-10-18 DIAGNOSIS — G35 Multiple sclerosis: Secondary | ICD-10-CM | POA: Diagnosis not present

## 2019-10-18 DIAGNOSIS — Z8781 Personal history of (healed) traumatic fracture: Secondary | ICD-10-CM | POA: Diagnosis not present

## 2019-10-18 DIAGNOSIS — K224 Dyskinesia of esophagus: Secondary | ICD-10-CM | POA: Diagnosis not present

## 2019-10-18 DIAGNOSIS — M549 Dorsalgia, unspecified: Secondary | ICD-10-CM | POA: Diagnosis not present

## 2019-10-18 DIAGNOSIS — R42 Dizziness and giddiness: Secondary | ICD-10-CM | POA: Diagnosis not present

## 2019-10-18 DIAGNOSIS — F329 Major depressive disorder, single episode, unspecified: Secondary | ICD-10-CM | POA: Diagnosis not present

## 2019-10-18 DIAGNOSIS — R39198 Other difficulties with micturition: Secondary | ICD-10-CM | POA: Diagnosis not present

## 2019-10-18 DIAGNOSIS — F419 Anxiety disorder, unspecified: Secondary | ICD-10-CM | POA: Diagnosis not present

## 2019-10-18 DIAGNOSIS — G629 Polyneuropathy, unspecified: Secondary | ICD-10-CM | POA: Diagnosis not present

## 2019-10-18 DIAGNOSIS — Z993 Dependence on wheelchair: Secondary | ICD-10-CM | POA: Diagnosis not present

## 2019-10-18 DIAGNOSIS — Z9181 History of falling: Secondary | ICD-10-CM | POA: Diagnosis not present

## 2019-10-19 ENCOUNTER — Ambulatory Visit: Payer: Medicare Other

## 2019-10-27 ENCOUNTER — Ambulatory Visit: Payer: Medicare Other | Attending: Internal Medicine

## 2019-10-27 DIAGNOSIS — Z23 Encounter for immunization: Secondary | ICD-10-CM

## 2019-10-27 NOTE — Progress Notes (Signed)
   Covid-19 Vaccination Clinic  Name:  Tonya Powers    MRN: [id number] DOB: 15-Dec-1960  10/27/2019  Tonya Powers was observed post Covid-19 immunization for 15 minutes without incident. She was provided with Vaccine Information Sheet and instruction to access the V-Safe system.   Tonya Powers was instructed to call 911 with any severe reactions post vaccine: Marland Kitchen Difficulty breathing  . Swelling of face and throat  . A fast heartbeat  . A bad rash all over body  . Dizziness and weakness   Immunizations Administered    Name Date Dose VIS Date Route   Pfizer COVID-19 Vaccine 10/27/2019  1:07 PM 0.3 mL 08/23/2018 Intramuscular   Manufacturer: [place]   Lot: [id number]   [place]: [id number]

## 2019-11-06 ENCOUNTER — Telehealth: Payer: Self-pay | Admitting: Neurology

## 2019-11-06 NOTE — Telephone Encounter (Signed)
Pt called wanting to speak to RN about getting an IV. Please advise.

## 2019-11-06 NOTE — Telephone Encounter (Signed)
Dr. Felecia Shelling approved pt to come in for IV steroids. She will come 11/08/19 at 1pm. Gave signed orders to intrafusion.

## 2019-11-06 NOTE — Telephone Encounter (Signed)
Called pt back. She reports home PT has helped her gain strength in her legs and vertigo has resolved. However, she is now is having more fatigue. She is wanting to come in for IV steroids. She can only come tomorrow for this. Advised I will speak with MD and call her back.  She has a f/u 12/05/19 at 1pm with Dr. Felecia Shelling. She has her second covid-19 vaccine scheduled for 11/20/19 at 1pm. She tolerated first inj okay.

## 2019-11-08 DIAGNOSIS — G35 Multiple sclerosis: Secondary | ICD-10-CM | POA: Diagnosis not present

## 2019-11-20 ENCOUNTER — Ambulatory Visit: Payer: Medicare Other | Attending: Internal Medicine

## 2019-11-20 DIAGNOSIS — Z23 Encounter for immunization: Secondary | ICD-10-CM

## 2019-11-20 NOTE — Progress Notes (Signed)
   Covid-19 Vaccination Clinic  Name:  Tonya Powers    MRN: [id number] DOB: 25-Aug-1960  11/20/2019  [name] was observed post Covid-19 immunization for 15 minutes without incident. She was provided with Vaccine Information Sheet and instruction to access the V-Safe system.   [name] was instructed to call 911 with any severe reactions post vaccine: Marland Kitchen Difficulty breathing  . Swelling of face and throat  . A fast heartbeat  . A bad rash all over body  . Dizziness and weakness   Immunizations Administered    Name Date Dose VIS Date Route   Pfizer COVID-19 Vaccine 11/20/2019  1:00 PM 0.3 mL 08/23/2018 Intramuscular   Manufacturer: [place]   Lot: [id number]   [place]: [id number]

## 2019-12-05 ENCOUNTER — Ambulatory Visit: Payer: Medicare Other | Admitting: Neurology

## 2019-12-07 ENCOUNTER — Ambulatory Visit: Payer: Medicare Other | Admitting: Neurology

## 2019-12-07 VITALS — BP 118/70 | HR 71 | Ht 65.0 in | Wt 144.5 lb

## 2019-12-07 DIAGNOSIS — R39198 Other difficulties with micturition: Secondary | ICD-10-CM

## 2019-12-07 DIAGNOSIS — W19XXXD Unspecified fall, subsequent encounter: Secondary | ICD-10-CM

## 2019-12-07 DIAGNOSIS — R42 Dizziness and giddiness: Secondary | ICD-10-CM

## 2019-12-07 DIAGNOSIS — G35 Multiple sclerosis: Secondary | ICD-10-CM

## 2019-12-07 DIAGNOSIS — R531 Weakness: Secondary | ICD-10-CM | POA: Diagnosis not present

## 2019-12-07 DIAGNOSIS — R600 Localized edema: Secondary | ICD-10-CM

## 2019-12-07 DIAGNOSIS — N390 Urinary tract infection, site not specified: Secondary | ICD-10-CM

## 2019-12-07 MED ORDER — AMPHETAMINE-DEXTROAMPHETAMINE 20 MG PO TABS: 20.0000 mg | ORAL_TABLET | Freq: Two times a day (BID) | ORAL | 0 refills | Status: DC

## 2019-12-07 MED ORDER — ROPINIROLE HCL 0.5 MG PO TABS: ORAL_TABLET | ORAL | 5 refills | Status: DC

## 2019-12-07 NOTE — Progress Notes (Signed)
[facility]  PATIENT: Tonya Powers DOB: Jul 03, 1960  REFERRING CLINICIAN: Mayra Neer is PCP HISTORY FROM: Patient  REASON FOR VISIT: MS and poor gait   HISTORICAL  CHIEF COMPLAINT:  Chief Complaint  Patient presents with  . Follow-up    RM 13, alone. Last seen 08/03/19     HISTORY OF PRESENT ILLNESS:  Tonya Powers is a 59 y.o. woman with an active form of secondary progressive MS.      Update 12/07/19: She had another bout of severe vertigo and did home vestibular rehab with benefit.  Valium and meclizine had not helped much.   She is doing well now.      She uses her scooter mostly as she can not go far with a walker (30 feet on a great day, less most days).    She uses a pool some as well when she can.   She was doing better before she fractured her right leg in December 2020.    She transfers well.     Her left leg is weaker than the right leg.    She gets a lot of pain in legs, left > right.   Pain is burning and covers rubbing over the skin worsens the intensity.    She has truble sleeping when pain is worse .  She is on gabapentin, lamotrigine and tramadol  She is off any DMT.  Ocrevus made her feel weaker and she had previously been on Betaseron.  She had her Covid-19 vaccination   Update 08/03/2019;: She fell in December and broke her right leg and sprained the right ankle.   She only did one PT session afterwards.   She is trying to do some exercises from PT at home.  She is seeing Dr. Doran Durand [facility] and will be seeing him later in the week.   She is noting more swelling in her legs.    She had no weight bearing and had a brace x 2 weeks.     She hasn't been using a walker since this started.   She can transfer independently.   She continues to have back pain.   She has had neck pain and fullness and has had a barium swallow showing esophageal spasms.  She had no strictures noted.  She also is to have an EGD with Dr. Collene Mares due to continued  symptoms.     Her father recently died and combined with her fracture she is more depressed and stressed.   She is sleeping ok now with the clonazepam and only needing Ambien some days.  Update 05/03/2019: She is noting more pain in the right buttock and the left neck.    Leg pain is worse with walking.  Back pain is more steady.  She denies any change in strength.  She can walk about 20 feet with a walker but she uses her scooter mostly.  She feels her MS is stable compared to her last visit.  Vertigo is not bothering her currently.   Update 03/28/19: She feels she is doing about the same.   She feels her gait is continuing to worsen.   The legs are weak and spastic, left > right.   She notes clonus in the legs in the morning and sometimes has phasic spasms in the right leg.   She uses a scooter mostly and has some falls with transfers.   No injuries.  She has dysesthesias and only has some benefit from lidocaine  patches and tramadol.  Lamotrigine 200 mg po tid and gabapentin 1200 mg tid used to help but less recently.  Pain is worse when clothes rubs against her skin.  Tegretol and tricyclics had not helped.   She feels her sciatica type pain is better this visit than most visits.  Vertigo is doing better.  She occasionlly takes valium (just one a day at most).  She takes zolpidem nightly and clonazepam at bedtime rarely.   In the past, she was on baclofen but stopped because she was sleepy on it.     She has some depression and anxiety and gets frustrated with her mobility issues and not being able to do much with her grandchild.  .  She is on sertraline with some benefit.  Adderall helps her fatigue and focus/attention deficit.     She has swelling, pain and coldness in her toes.   I checked ESR, cryoglobulin, ANCA and ANA for possible Raynauds--- they were fine.    Update 11/09/2018: She feels the MS is stable with weakness and poor gait.    MRI's of the cervical spine and brain have not  shown any new activity though plaque burden is high.  She is reporting a lot of pain in the left side of her neck going to the back of the head and the shoulder region.  In the past, trigger point injections have helped reduce the pain.  Zoloft has helped mood but she does feel more sleepy.  She can take Adderall 20 mg bid but usually just takes once a day and we discussed taking 10 - 20 mg around lunchtime.      Update 09/14/2018: Since the last visit, she had an MRI of the cervical spine due to more weakness and left neck pain.    I personally reviewed the MRI of the cervical spine performed 09/13/2018.   It shows multiple T2/flair hyperintense foci in the spinal cord.  I compared it side-by-side with an MRI of the brain performed 02/28/2016 and there are no definite new lesions.   Several of these are lateral and will be expected to affect strength and spasticity.  She also has some degenerative changes but not severe enough to lead to nerve root compression at any of the levels.  There is no spinal stenosis.  She has leg weakness, left > right, and some spasticity.    Intermittent steroids prn have helped her.   She is no longer on a DMT.  She continues to have skin dysesthesias, worse in the left neck/shoulder and the right hip and down.  She also has pain in the left lateral neck of the jaw.  She is on Nucynta when pain is worse and tramadol most days  Her anxiety is doing better on duloxetine but it has not helped the pain and it seems to have changed her taste --- everything is sour.     We discussed switching to an SSRI as  Update 03/10/2018: For the past 2 weeks, she has had more vertigo.  The onset was over a day.  Meclizine has not helped.  The sister did an Epley maneuver or other vestibular exercise and noted that there was nystagmus.  Over the past week the vertigo persisted and she went to [facility]/07/2018 and received IV Ativan with benefit that day.  Zofran greatly helped the nausea.   Initially, she had significant nausea and also some vomiting.  She continues to experience a lot of pain in  the right hip and buttock region.  In the past, piriformis and/or trochanteric bursa injections have helped her for many weeks.  Lamotrigine has helped this pain some but oxcarbazepine did not.  She is not currently on a disease modifying therapy for her MS.  She did not feel that she got any benefit from [place] and also was progressing on other medications.  Her main problem is gait and she has bilateral leg weakness.  She can only take a few steps with bilateral support.  She mostly uses a scooter.  She has had a fair amount of fatigue and reports attention deficit disorder.  She has had some depression but mood is doing fairly well currently.     Update 01/03/2018: She is noting more difficulty with her painful dysesthesias.  The wrose pain is in her right hip and left > right  legs but she has skin dysesthestic pain from her chest down (not much in arms).    Her feet get very red with swelling as the day goes on.     She is on lamotrigine 200 mg po bid, gabapentin 800 mg po qid and Nucynta (prn only)    She uses her scooter mostly and can go 20 feet or so with her walker but not repeatedly as it wears her out.   She has left > right leg weakness.   Arms are strong.    She has insomnia and takes clonazepam and doxepin nightly.   She also has anxiety (not depression) and will occ take a valium if this is more of a problem.      Update 04/29/2017: She felt more weakness and vertigo on Ocrelizumab and she has stopped.   She is not currently on any DMT.   She takes steroids IV every month or two and notes improvement of fatigue and general strength. She has left > right leg weakness and spasticity.   She feels gait is worse and she has more trouble lifting the left leg.   With a walker she can go 10-20 feet and she uses a scooter mostly.     She can transfer though this is sometimes difficult and  she has had falls.   Ampyra had not helped.    She has dysesthetic pain helped by tramadol, gabapentin and lamotrigine.   She uses Nucynta sparingly.    She still has bouts of severe vertigo.   She does Epley maneuvers when it reoccurs.     She has low back pain that intensifies with prolonged siting.   Solanpas lidocaine patches help some.   Piriformis shots have also helped.    She has fatigue daily.  Adderall has greatly helped her fatigue and poor focus.  She has trouble getting comfortable at night according insomnia.  Ambien, clonazepam and doxepin help her sleep but without one of them, sleep is worse.        From 02/02/2017:   Vertigo:    She has had vertigo x 15 days.   She has been on meclizine, phenergan, Medrol dosepak, clonazepam (not sure if helped but sleeps better)and IV Solu-Medrol.  Dramamine may have helped slightly.   She has vertigo while still and has diplopia.    Initially she also had N/V.  The diplopia is worse with movements.     She went to the ED and had a Morphine shot when pain was worse and also had IV fluids.     MS:   She is back on  the Ocrevus.   She thinks her MS is probably stable but is concerned that the vertigo might be a new exacerbation.Marland Kitchen    She was on Tysabri and was stable but converted to JCV Ab positive and stopped.   She could not tolerate Tecfidera.        Gait/strength :    Her gait is very poor and she mostly uses a scooter. She can use a walker for a few steps (probably up to 20 feet).     She has left > right leg weakness and spasticity.       Dysesthesia/hip pain:   She continues to report burning pain in the right leg and right flank lamotrigine has only helps the pain a little bit.    Tegretol and gabapentin have not helped much.   TCA's have not helped.     Baclofen made her feel weaker and she stopped.     Nucynta is too expensive.   Tramadol has not helped much   Bladder/bowel:  She has a difficult bladder dysfunction with frequent  incontinence. She has quite a bit of hesitancy, worse when she uses the opiates (now off of these).    Tamsulosin did not really help the hesitancy. Desmopressin helps her to the bed at night to help her more. She still has frequent incontinence.     She reports constipation. Linzess did not help.    Nucynta does not worsen constipation like opiates did but is very expensive.  Fatigue/sleep:   She has fatigue most days, worse with heat. Adderall has helped.   She does well doing pool exercises.,  When fatiue is more severe she takes  Solu-Medrol infusions.   She has both sleep onset and sleep maintenance insomnia.   She takes clonazepam 1 mg and Ambien every night due to difficulty falling and staying asleep. Clonazepam also helps her leg spasms as well as helps her to fall asleep.   Initially, does note progressive help her sleep but now she is having a lot of incontinence again for   Mood/cognition:   She notes mild depression and anxiety.    She prefers not to take an antidepressant.    She rarely takes Xanax 0.5 mg when she has more anxiety. She notes more trouble with cognition, especially short term memory.    Adderall helps focus some    MS History:  She presented with optic neuritis in 1991 followed shortly by difficulties with leg weakness and by right trigeminal neuralgia. In retrospect, couple years earlier when she had her daughter, has some difficulties with her legs and needed to go on short-term disability. At first she was not diagnosed with MS but after more of the symptoms she underwent MRI testing and had a lumbar puncture by Dr. Johnnye Sima. The imaging and the CSF was consistent with multiple sclerosis. When Betaseron became available she was placed on that. She was on Betaseron for about 10 years. She felt that she did not have too many exacerbations during that time but she had a lot of difficulty tolerating the Betaseron due to skin reactions. Around 12-15 years ago, she started to use a  cane and she has had progressive gait disturbance over the last decade. Around the house for short distances, she uses a walker but uses her scooter for longer distances. Outside she uses a wheelchair pushed by others.    REVIEW OF SYSTEMS:  Constitutional: No fevers, chills, sweats, or change in appetite.  She has Fatigue  Eyes: No visual changes, double vision, eye pain Ear, nose and throat: No hearing loss, ear pain, nasal congestion, sore throat Cardiovascular: No chest pain, palpitations Respiratory:  No shortness of breath at rest or with exertion.   No wheezes GastrointestinaI: Mild dysphagia.  Constipation.  No nausea, vomiting, diarrhea.   Genitourinary:  No dysuria but has urinary retention and frequency.  Desmopressin helps nocturia. Musculoskeletal:  No neck pain,   Has lower back pain and buttocks.  Hips ans other joints ok Integumentary: No rash, pruritus, skin lesions Neurological: as above Psychiatric: see above Endocrine: No palpitations, diaphoresis, change in appetite, change in weigh or increased thirst Hematologic/Lymphatic:  No anemia, purpura, petechiae. Allergic/Immunologic: No itchy/runny eyes, nasal congestion, recent allergic reactions, rashes  ALLERGIES: Allergies  Allergen Reactions  . Codeine Nausea And Vomiting    HOME MEDICATIONS: Outpatient Medications Prior to Visit  Medication Sig Dispense Refill  . acetaminophen (TYLENOL) 500 MG tablet Take 1,000 mg by mouth daily as needed for pain.    . Cholecalciferol (VITAMIN D PO) Take 1 tablet by mouth daily.    . clonazePAM (KLONOPIN) 1 MG tablet TAKE 1 TABLET(1 MG) BY MOUTH AT BEDTIME AS NEEDED 30 tablet 3  . desmopressin (DDAVP) 0.1 MG tablet Take 1 tablet (0.1 mg total) by mouth at bedtime as needed (bladder.). 30 tablet 11  . diazepam (VALIUM) 5 MG tablet TAKE 1 TABLET BY MOUTH EVERY 8 HOURS AS NEEDED 90 tablet 5  . diclofenac (VOLTAREN) 75 MG EC tablet Take 1 tablet (75 mg total) by mouth 2 (two) times  daily. Do not take with ibuprofen 60 tablet 3  . estradiol (CLIMARA - DOSED IN MG/24 HR) 0.1 mg/24hr patch Place 0.1 mg onto the skin once a week.  5  . gabapentin (NEURONTIN) 800 MG tablet TAKE 1 TABLET(800 MG) BY MOUTH FOUR TIMES DAILY 120 tablet 11  . ibuprofen (ADVIL,MOTRIN) 200 MG tablet Take 400 mg by mouth every 6 (six) hours as needed for headache (headache).    . lamoTRIgine (LAMICTAL) 200 MG tablet TAKE 1 TABLET BY MOUTH THREE TIMES DAILY 270 tablet 3  . lidocaine (LIDODERM) 5 % Place 1 patch daily onto the skin. Remove & Discard patch within 12 hours or as directed by MD 30 patch 11  . meclizine (ANTIVERT) 25 MG tablet TAKE 1 TABLET(25 MG) BY MOUTH THREE TIMES DAILY AS NEEDED FOR DIZZINESS 30 tablet 0  . omeprazole (PRILOSEC) 40 MG capsule Take 40 mg by mouth 2 (two) times daily.    . promethazine (PHENERGAN) 25 MG tablet Take 1 tablet (25 mg total) by mouth every 6 (six) hours as needed for nausea (nausea). 30 tablet 3  . sertraline (ZOLOFT) 50 MG tablet TAKE 1 TABLET(50 MG) BY MOUTH DAILY 30 tablet 11  . traMADol (ULTRAM) 50 MG tablet TAKE 1 TABLET BY MOUTH FOUR TIMES DAILY AS NEEDED FOR MODERATE PAIN 120 tablet 5  . zolpidem (AMBIEN) 10 MG tablet Take 1 tablet (10 mg total) by mouth at bedtime as needed. for sleep 30 tablet 5  . amphetamine-dextroamphetamine (ADDERALL) 20 MG tablet Take 1 tablet (20 mg total) by mouth 2 (two) times daily. 60 tablet 0   No facility-administered medications prior to visit.    PAST MEDICAL HISTORY: Past Medical History:  Diagnosis Date  . Anxiety   . Depression   . Gait instability    uses wheelchair or assistance  . H/O steroid therapy    IV infusion every 6 to 8 weeks- last 05-31-14  [facility]  . History of breast biopsy   . Multiple sclerosis ([place])   . Neuromuscular disorder ([place])    MS  . PONV (postoperative nausea and vomiting)   . Skin cancer     PAST SURGICAL HISTORY: Past Surgical History:  Procedure Laterality Date   . ABDOMINAL HYSTERECTOMY    . BREAST BIOPSY Right 09/20/2012   Procedure: BREAST BIOPSY WITH NEEDLE LOCALIZATION;  Surgeon: Rolm Bookbinder, MD;  Location: [place];  Service: General;  Laterality: Right;  . BREAST EXCISIONAL BIOPSY Right    benign  . CESAREAN SECTION    . COLONOSCOPY WITH PROPOFOL N/A 07/13/2014   Procedure: COLONOSCOPY WITH PROPOFOL;  Surgeon: Beryle Beams, MD;  Location: [facility];  Service: Endoscopy;  Laterality: N/A;  . DEBRIDEMENT SKIN / SQ / MUSCLE OF ARM Right 07/13/2003   I & D; debridement of tissue within triceps muscle  . LEG SURGERY     sx as a child  . MELANOMA EXCISION    . PARTIAL HYSTERECTOMY    . TONSILLECTOMY     as a child    FAMILY HISTORY: Family History  Problem Relation Age of Onset  . Hyperlipidemia Mother   . Hypertension Mother   . Hypertension Father   . Diabetes type II Father     SOCIAL HISTORY:  Social History   Socioeconomic History  . Marital status: Married    Spouse name: Not on file  . Number of children: Not on file  . Years of education: Not on file  . Highest education level: Not on file  Occupational History  . Not on file  Tobacco Use  . Smoking status: Never Smoker  . Smokeless tobacco: Never Used  Vaping Use  . Vaping Use: Never used  Substance and Sexual Activity  . Alcohol use: Not Currently    Comment: social  . Drug use: No  . Sexual activity: Not on file  Other Topics Concern  . Not on file  Social History Narrative   Lives at home w/ her husband   Social Determinants of Health   Financial Resource Strain:   . Difficulty of Paying Living Expenses:   Food Insecurity:   . Worried About Charity fundraiser in the Last Year:   . Arboriculturist in the Last Year:   Transportation Needs:   . Film/video editor (Medical):   Marland Kitchen Lack of Transportation (Non-Medical):   Physical Activity:   . Days of Exercise per Week:   . Minutes of Exercise per Session:   Stress:   .  Feeling of Stress :   Social Connections:   . Frequency of Communication with Friends and Family:   . Frequency of Social Gatherings with Friends and Family:   . Attends Religious Services:   . Active Member of Clubs or Organizations:   . Attends Archivist Meetings:   Marland Kitchen Marital Status:   Intimate Partner Violence:   . Fear of Current or Ex-Partner:   . Emotionally Abused:   Marland Kitchen Physically Abused:   . Sexually Abused:      PHYSICAL EXAM  Vitals:   12/07/19 1320  BP: 118/70  Pulse: 71  SpO2: 98%  Weight: 144 lb 8 oz (65.5 kg)  Height: 5' 5"  (1.651 m)    Body mass index is 24.05 kg/m.   General: The patient is well-developed and well-nourished and in no acute distress   Skin/Ext/Musculoskeletal: She has pedal edema, more than  last visit.   Currently there is not significant neck tenderness. Neurologic Exam  Mental status: The patient is alert and oriented x 3 at the time of the examination. The patient has apparent normal recent and remote memory, with an apparently normal attention span and concentration ability.   Speech is normal.  Cranial nerves: Extraocular movements are full.  Facial strength and sensation was normal.  Trapezius strength is normal.. No dysarthria is noted.  No obvious hearing deficits are noted.  Motor:  Muscle bulk and tone are normal.  Strength is 2+/5 left and 3/5 right proximal leg strength.  Strength is 4- to 4/5 distally.  The arms are fairly strong.  Sensory: She has normal sensation to touch and vibration in the arms but reduced touch sensation in the left leg relative to the right  Coordination: Cerebellar testing reveals reduced finger-nose-finger and poor heel-to-shin bilaterally.  Gait and station: She needs support to stand.  She cannot walk (just 1-2 steps with strong bilateral support).  Reflexes: Deep tendon reflexes are symmetric and increased in legs bilaterally with spread at the knees.  There is nonsustained ankle clonus  bilaterally.    DIAGNOSTIC DATA (LABS, IMAGING, TESTING) - I reviewed patient records, labs, notes, testing and imaging myself where available.  Lab Results  Component Value Date   WBC 8.5 06/07/2019   HGB 13.9 06/07/2019   HCT 40.8 06/07/2019   MCV 91 06/07/2019   PLT 271 06/07/2019      Component Value Date/Time   NA 144 06/07/2019 1519   K 5.2 06/07/2019 1519   CL 100 06/07/2019 1519   CO2 27 06/07/2019 1519   GLUCOSE 95 06/07/2019 1519   GLUCOSE 104 (H) 03/07/2018 1138   BUN 15 06/07/2019 1519   CREATININE 0.70 06/07/2019 1519   CALCIUM 10.0 06/07/2019 1519   PROT 6.9 06/07/2019 1519   ALBUMIN 4.9 06/07/2019 1519   AST 19 06/07/2019 1519   ALT 13 06/07/2019 1519   ALKPHOS 101 06/07/2019 1519   BILITOT 0.2 06/07/2019 1519   GFRNONAA 94 06/07/2019 1519   GFRAA 109 06/07/2019 1519      ASSESSMENT AND PLAN  Multiple sclerosis (H[facility])  Weakness  Fall, subsequent encounter  Vertigo  Urinary dysfunction  Pedal edema   1.   Continue physical therapy and try to exercise as tolerated.   2.  Adderall has helped her fatigue and attention deficit.  I will renew 3.   Clonazepam nightly for spasticity and insomnia.  Tramadol for pain. 4.  Continue gabapentin and lamotrigine 5.   she will return to see me in 5-6 months or sooner if she has new or worsening neurologic symptoms.    Javonna Balli A. Felecia Shelling, MD, PhD 07/25/5168, 01:74 AM Certified in Neurology, Clinical Neurophysiology, Sleep Medicine, Pain Medicine and Neuroimaging  [facility] [address], [place],  [address] [phone]

## 2019-12-08 ENCOUNTER — Encounter: Payer: Self-pay | Admitting: Neurology

## 2019-12-08 DIAGNOSIS — W19XXXA Unspecified fall, initial encounter: Secondary | ICD-10-CM | POA: Insufficient documentation

## 2019-12-12 DIAGNOSIS — Z0120 Encounter for dental examination and cleaning without abnormal findings: Secondary | ICD-10-CM | POA: Diagnosis not present

## 2020-01-03 ENCOUNTER — Other Ambulatory Visit: Payer: Self-pay | Admitting: Neurology

## 2020-01-04 ENCOUNTER — Telehealth: Payer: Self-pay

## 2020-01-04 NOTE — Telephone Encounter (Signed)
Pt would like her last 2 visit notes sent to  [facility] (insurance complant for LTD) Fax number: [phone] Ph #: [phone]

## 2020-01-09 ENCOUNTER — Telehealth: Payer: Self-pay | Admitting: *Deleted

## 2020-01-09 NOTE — Telephone Encounter (Signed)
Done

## 2020-01-24 ENCOUNTER — Telehealth: Payer: Self-pay | Admitting: Neurology

## 2020-01-24 DIAGNOSIS — G35 Multiple sclerosis: Secondary | ICD-10-CM | POA: Diagnosis not present

## 2020-01-24 NOTE — Telephone Encounter (Signed)
Called pt back. Advised Dr. Felecia Shelling approved IV steroids. Spoke with the infusion suite, they can fit her in at 12pm or 2pm. She accepted 12pm appt. Gave completed/signed orders to the infusion suite.

## 2020-01-24 NOTE — Telephone Encounter (Signed)
Patient called and wanted to know if she can get an infusion today.

## 2020-02-01 DIAGNOSIS — R109 Unspecified abdominal pain: Secondary | ICD-10-CM | POA: Diagnosis not present

## 2020-02-01 DIAGNOSIS — J069 Acute upper respiratory infection, unspecified: Secondary | ICD-10-CM | POA: Diagnosis not present

## 2020-02-01 DIAGNOSIS — K660 Peritoneal adhesions (postprocedural) (postinfection): Secondary | ICD-10-CM | POA: Diagnosis not present

## 2020-02-08 ENCOUNTER — Other Ambulatory Visit: Payer: Self-pay | Admitting: Neurology

## 2020-02-08 NOTE — Addendum Note (Signed)
Addended by: Wyvonnia Lora on: 02/08/2020 02:02 PM   Modules accepted: Orders

## 2020-02-08 NOTE — Telephone Encounter (Signed)
Pt is requesting a refill for amphetamine-dextroamphetamine (ADDERALL) 20 MG tablet  Pharmacy: [id number]  

## 2020-02-09 MED ORDER — AMPHETAMINE-DEXTROAMPHETAMINE 20 MG PO TABS: 20.0000 mg | ORAL_TABLET | Freq: Two times a day (BID) | ORAL | 0 refills | Status: DC

## 2020-02-10 ENCOUNTER — Other Ambulatory Visit: Payer: Self-pay | Admitting: Neurology

## 2020-02-27 DIAGNOSIS — Z85828 Personal history of other malignant neoplasm of skin: Secondary | ICD-10-CM | POA: Diagnosis not present

## 2020-02-27 DIAGNOSIS — L820 Inflamed seborrheic keratosis: Secondary | ICD-10-CM | POA: Diagnosis not present

## 2020-02-27 DIAGNOSIS — G63 Polyneuropathy in diseases classified elsewhere: Secondary | ICD-10-CM | POA: Diagnosis not present

## 2020-03-19 ENCOUNTER — Telehealth: Payer: Self-pay | Admitting: Neurology

## 2020-03-19 NOTE — Telephone Encounter (Signed)
Pt states she has tendonitis in her arm, left foot pain and 2 pebbles in her stomach that are giving her issues.  Pt wants to know if this is something Dr Felecia Shelling will see her for and if not what type Dr does she need to go to.  Please call.

## 2020-03-19 NOTE — Telephone Encounter (Signed)
Called and spoke with pt. Advised Dr. Felecia Shelling wants her to follow up with orthopaedic MD for arm sx and podiatry for issues with her foot. I asked her what she meant by "pebbles" in her stomach. She states she has knots on her stomach from past injections. She saw someone at her PCP (regular PCP was not able to see her) office who referred her to surgeon and then they referred her to plastic surgeon for further evaluation. She is hesitant to do this. She will contact PCP back to further discuss options.

## 2020-03-22 DIAGNOSIS — M779 Enthesopathy, unspecified: Secondary | ICD-10-CM | POA: Diagnosis not present

## 2020-03-22 DIAGNOSIS — G35 Multiple sclerosis: Secondary | ICD-10-CM | POA: Diagnosis not present

## 2020-03-22 DIAGNOSIS — I8310 Varicose veins of unspecified lower extremity with inflammation: Secondary | ICD-10-CM | POA: Diagnosis not present

## 2020-03-22 DIAGNOSIS — K654 Sclerosing mesenteritis: Secondary | ICD-10-CM | POA: Diagnosis not present

## 2020-03-23 ENCOUNTER — Other Ambulatory Visit: Payer: Self-pay | Admitting: Neurology

## 2020-03-26 ENCOUNTER — Other Ambulatory Visit: Payer: Self-pay | Admitting: Neurology

## 2020-03-28 ENCOUNTER — Ambulatory Visit (INDEPENDENT_AMBULATORY_CARE_PROVIDER_SITE_OTHER): Payer: Medicare Other | Admitting: Plastic Surgery

## 2020-03-28 ENCOUNTER — Other Ambulatory Visit: Payer: Self-pay

## 2020-03-28 ENCOUNTER — Encounter: Payer: Self-pay | Admitting: Plastic Surgery

## 2020-03-28 VITALS — BP 115/77 | HR 73 | Temp 98.1°F | Ht 65.0 in | Wt 142.0 lb

## 2020-03-28 DIAGNOSIS — M7989 Other specified soft tissue disorders: Secondary | ICD-10-CM | POA: Diagnosis not present

## 2020-03-28 NOTE — Progress Notes (Signed)
Referring Provider Mayra Neer, MD [place] [employer] [place],  [place] [id number]   CC:  Chief Complaint  Patient presents with  . Advice Only      Tonya Powers is an 59 y.o. female.  HPI: Patient presents to discuss to tender areas on her abdomen.  She has had these for years.  She has advanced multiple sclerosis and previously was performing injections into her abdomen for her medications.  She believes as a result of that she developed these firm painful areas and wants to see if it can be excised.  They have not been changing in size but are discrete masses that are very tender for her  Allergies  Allergen Reactions  . Codeine Nausea And Vomiting    Outpatient Encounter Medications as of 03/28/2020  Medication Sig Note  . acetaminophen (TYLENOL) 500 MG tablet Take 1,000 mg by mouth daily as needed for pain.   Marland Kitchen amphetamine-dextroamphetamine (ADDERALL) 20 MG tablet Take 1 tablet (20 mg total) by mouth 2 (two) times daily.   . Cholecalciferol (VITAMIN D PO) Take 1 tablet by mouth daily.   . clonazePAM (KLONOPIN) 1 MG tablet TAKE 1 TABLET(1 MG) BY MOUTH AT BEDTIME AS NEEDED   . desmopressin (DDAVP) 0.1 MG tablet Take 1 tablet (0.1 mg total) by mouth at bedtime as needed (bladder.).   Marland Kitchen diazepam (VALIUM) 5 MG tablet TAKE 1 TABLET BY MOUTH EVERY 8 HOURS AS NEEDED   . diclofenac (VOLTAREN) 75 MG EC tablet TAKE 1 TABLET(75 MG) BY MOUTH TWICE DAILY. DO NOT TAKE WITH IBUPROFEN   . estradiol (CLIMARA - DOSED IN MG/24 HR) 0.1 mg/24hr patch Place 0.1 mg onto the skin once a week.   . gabapentin (NEURONTIN) 800 MG tablet TAKE 1 TABLET(800 MG) BY MOUTH FOUR TIMES DAILY   . ibuprofen (ADVIL,MOTRIN) 200 MG tablet Take 400 mg by mouth every 6 (six) hours as needed for headache (headache).   . lamoTRIgine (LAMICTAL) 200 MG tablet TAKE 1 TABLET BY MOUTH THREE TIMES DAILY   . lidocaine (LIDODERM) 5 % Place 1 patch daily onto the skin. Remove & Discard patch within 12 hours or as  directed by MD 05/07/2017: PA for Lidoderm 5% patches #30/30 completed via [employer].  Dx: Postherpetic Neuralgia/fim  . meclizine (ANTIVERT) 25 MG tablet TAKE 1 TABLET(25 MG) BY MOUTH THREE TIMES DAILY AS NEEDED FOR DIZZINESS   . omeprazole (PRILOSEC) 40 MG capsule Take 40 mg by mouth 2 (two) times daily.   . promethazine (PHENERGAN) 25 MG tablet Take 1 tablet (25 mg total) by mouth every 6 (six) hours as needed for nausea (nausea).   Marland Kitchen rOPINIRole (REQUIP) 0.5 MG tablet Take one or two at bedtime   . sertraline (ZOLOFT) 50 MG tablet TAKE 1 TABLET(50 MG) BY MOUTH DAILY   . traMADol (ULTRAM) 50 MG tablet TAKE 1 TABLET BY MOUTH FOUR TIMES DAILY AS NEEDED FOR MODERATE PAIN   . zolpidem (AMBIEN) 5 MG tablet Take 1 tablet (5 mg total) by mouth at bedtime as needed for sleep.    No facility-administered encounter medications on file as of 03/28/2020.     Past Medical History:  Diagnosis Date  . Anxiety   . Depression   . Gait instability    uses wheelchair or assistance  . H/O steroid therapy    IV infusion every 6 to 8 weeks- last 05-31-14 [facility]  . History of breast biopsy   . Multiple sclerosis ([facility])   .  Neuromuscular disorder ([place])    MS  . PONV (postoperative nausea and vomiting)   . Skin cancer     Past Surgical History:  Procedure Laterality Date  . ABDOMINAL HYSTERECTOMY    . BREAST BIOPSY Right 09/20/2012   Procedure: BREAST BIOPSY WITH NEEDLE LOCALIZATION;  Surgeon: Rolm Bookbinder, MD;  Location: [facility];  Service: General;  Laterality: Right;  . BREAST EXCISIONAL BIOPSY Right    benign  . CESAREAN SECTION    . COLONOSCOPY WITH PROPOFOL N/A 07/13/2014   Procedure: COLONOSCOPY WITH PROPOFOL;  Surgeon: Beryle Beams, MD;  Location: [facility];  Service: Endoscopy;  Laterality: N/A;  . DEBRIDEMENT SKIN / SQ / MUSCLE OF ARM Right 07/13/2003   I & D; debridement of tissue within triceps muscle  . LEG SURGERY     sx as a child  . MELANOMA  EXCISION    . PARTIAL HYSTERECTOMY    . TONSILLECTOMY     as a child    Family History  Problem Relation Age of Onset  . Hyperlipidemia Mother   . Hypertension Mother   . Hypertension Father   . Diabetes type II Father     Social History   Social History Narrative   Lives at home w/ her husband     Review of Systems General: Denies fevers, chills, weight loss CV: Denies chest pain, shortness of breath, palpitations  Physical Exam Vitals with BMI 03/28/2020 12/07/2019 09/04/2019  Height 5\' 5"  5\' 5"  5\' 5"   Weight 142 lbs 144 lbs 8 oz 139 lbs  BMI 23.63 86.57 84.69  Systolic 629 528 413  Diastolic 77 70 84  Pulse 73 71 66    General:  No acute distress,  Alert and oriented, Non-Toxic, Normal speech and affect Examination of the abdomen shows 2 firm nodular tender masses.  There is 1 on the left side and 1 on the right side.  They are about 2 cm in size and are subcutaneous.  There is no other scars or masses.  There is no surgical scars.  Assessment/Plan Patient presents with what I suspect are 2 areas of fat necrosis in her anterior abdominal wall.  Given that these are symptomatic I think it is reasonable to excise them.  I think they are small enough that this could safely be done under local.  She would be at a higher risk for anesthesia given her MS.  I discussed the risks of excision include bleeding, infection, damage to surrounding structures and need for additional procedures.  I explained that it may not totally get rid of her pain but given that the masses are focally tender I thought there was a good chance she would notice symptom improvement.  She will plan to move forward with this.  Cindra Presume 03/28/2020, 2:20 PM

## 2020-04-08 ENCOUNTER — Other Ambulatory Visit: Payer: Self-pay | Admitting: Neurology

## 2020-04-08 MED ORDER — AMPHETAMINE-DEXTROAMPHETAMINE 20 MG PO TABS: 20.0000 mg | ORAL_TABLET | Freq: Two times a day (BID) | ORAL | 0 refills | Status: DC

## 2020-04-08 NOTE — Telephone Encounter (Signed)
Pt request refill amphetamine-dextroamphetamine (ADDERALL) 20 MG tablet at [facility] [phone]

## 2020-04-11 NOTE — Telephone Encounter (Signed)
Called pt back to further discuss. She is scheduled for this upcoming Monday, 04/15/20 at 9:45am with Dr. Claudia Desanctis at [facility]. He is going to remove two spots of scar tissue on her stomach that are smaller than a dime size. She wanted to make sure Dr. Felecia Shelling was aware and did not have any concerns about this. Advised I will send FYI and only call back if he has any concerns with her proceeding with this. She verbalized understanding.

## 2020-04-11 NOTE — Telephone Encounter (Signed)
She is fine to proceed with this type of surgery

## 2020-04-11 NOTE — Telephone Encounter (Signed)
This is Tonya Powers. Pt. Left vm to discuss removal of pebbles from scar tissue. Pt. Is wanting to know what Dr. Felecia Shelling thinks for upcoming surgery on this coming Mon. Please advise

## 2020-04-15 ENCOUNTER — Other Ambulatory Visit: Payer: Self-pay

## 2020-04-15 ENCOUNTER — Other Ambulatory Visit (HOSPITAL_COMMUNITY)
Admission: RE | Admit: 2020-04-15 | Discharge: 2020-04-15 | Disposition: A | Discharge status: Home / Self Care | Payer: Medicare Other | Source: Ambulatory Visit | Attending: Plastic Surgery | Admitting: Plastic Surgery

## 2020-04-15 ENCOUNTER — Encounter: Payer: Self-pay | Admitting: Plastic Surgery

## 2020-04-15 ENCOUNTER — Ambulatory Visit (INDEPENDENT_AMBULATORY_CARE_PROVIDER_SITE_OTHER): Payer: Medicare Other | Admitting: Plastic Surgery

## 2020-04-15 VITALS — BP 124/70 | HR 71 | Temp 98.7°F

## 2020-04-15 DIAGNOSIS — M7989 Other specified soft tissue disorders: Secondary | ICD-10-CM | POA: Insufficient documentation

## 2020-04-15 NOTE — Progress Notes (Signed)
Operative Note   DATE OF OPERATION: 04/15/2020  LOCATION:    SURGICAL DEPARTMENT: Plastic Surgery  PREOPERATIVE DIAGNOSES:  Abdominal subcutaneous masses x2   POSTOPERATIVE DIAGNOSES:  same  PROCEDURE:  1. Excision of Subcutaneous abdominal wall masses left and right measuring 3.5 cm 2. Complex closure measuring 7 cm  SURGEON: Talmadge Coventry, MD  ANESTHESIA:  Local  COMPLICATIONS: None.   INDICATIONS FOR PROCEDURE:  The patient, Tonya Powers is a 59 y.o. female born on 12-22-1960, is here for treatment of painful abdominal wall subcutaneous masses. MRN: [id number]  CONSENT:  Informed consent was obtained directly from the patient. Risks, benefits and alternatives were fully discussed. Specific risks including but not limited to bleeding, infection, hematoma, seroma, scarring, pain, infection, wound healing problems, and need for further surgery were all discussed. The patient did have an ample opportunity to have questions answered to satisfaction.   DESCRIPTION OF PROCEDURE:  Local anesthesia was administered. The patient's operative site was prepped and draped in a sterile fashion. A time out was performed and all information was confirmed to be correct.  The lesions were excised with a 15 blade and cautery.  Hemostasis was obtained.  Circumferential undermining was performed and the skin was advanced and closed in layers with interrupted buried Monocryl sutures and 4-0 monocryl for the skin.  The lesion excised measured 3.5 cm each, and the total length of closure measured 7 cm.    The patient tolerated the procedure well.  There were no complications.

## 2020-04-17 LAB — SURGICAL PATHOLOGY

## 2020-05-01 NOTE — Progress Notes (Signed)
Subjective:     Patient ID: Tonya Powers, female    DOB: 1960/10/14, 59 y.o.   MRN: [id number]  Chief Complaint  Patient presents with  . Follow-up    HPI: The patient is a 59 y.o. female here for follow-up after undergoing excision of subcutaneous abdominal wall masses left and right measuring 3.5 cm on 04/15/20 with Dr. Claudia Desanctis.  ~ 2 weeks PO Patient reports she is doing well.  She says the left incision is healing very well and the previous pain she had had has resolved.  Reports the incision on the right still has the Steri-Strip on it but she feels it is healing well.  She does have a bit of pain on this side.  She denies fever/chills, nausea/vomiting.  She reports no drainage from either incision.   Review of Systems  Constitutional: Negative for chills and fever.  Respiratory: Negative for shortness of breath.   Cardiovascular: Negative for chest pain.  Gastrointestinal: Negative for constipation, diarrhea, nausea and vomiting.  Skin: Negative for color change, pallor, rash and wound.       Reports left incision is healing well.  Her previous pain is resolved.  Reports right incision still has Steri-Strip.  She has some pain on this right side.  Reports no drainage.     Objective:   Vital Signs BP 120/80 (BP Location: Left Arm, Patient Position: Sitting, Cuff Size: Normal)   Pulse 76   Temp 98.3 F (36.8 C) (Oral)   SpO2 98%  Vital Signs and Nursing Note Reviewed  Physical Exam Constitutional:      General: She is not in acute distress.    Appearance: Normal appearance. She is normal weight. She is not ill-appearing.  HENT:     Head: Normocephalic and atraumatic.  Eyes:     Extraocular Movements: Extraocular movements intact.  Cardiovascular:     Rate and Rhythm: Normal rate.  Pulmonary:     Effort: Pulmonary effort is normal.  Abdominal:     Comments: Right Steri-Strip was removed today.  Left Steri-Strip has fallen off already.  Bilateral incisions are  healing very nicely, C/D/I.  No signs of redness, drainage, infection.  Small amount of scar tissue palpable on the right incision.  No signs of seroma.  Musculoskeletal:     Cervical back: Normal range of motion.  Skin:    General: Skin is warm and dry.     Coloration: Skin is not pale.     Findings: No erythema or rash.  Neurological:     Mental Status: She is alert and oriented to person, place, and time.     Gait: Gait is intact.  Psychiatric:        Mood and Affect: Mood and affect normal.        Behavior: Behavior normal.        Thought Content: Thought content normal.        Cognition and Memory: Memory normal.        Judgment: Judgment normal.       Assessment/Plan:     ICD-10-CM   1. Fat necrosis  M79.89    Ms. Morford is healing very nicely.  Bilateral incisions are healing well, C/D/I.  No signs of infection, redness, drainage, seroma.  She has a small amount of scar tissue palpable on the right incision.  Recommend allowing it to heal a little bit more to see if the mild pain on the right resolves.  Provided return precautions.  Follow-up as needed.  Call office with any questions/concerns.  Threasa Heads, PA-C 05/02/2020, 11:22 AM

## 2020-05-02 ENCOUNTER — Other Ambulatory Visit: Payer: Self-pay

## 2020-05-02 ENCOUNTER — Encounter: Payer: Self-pay | Admitting: Plastic Surgery

## 2020-05-02 ENCOUNTER — Ambulatory Visit (INDEPENDENT_AMBULATORY_CARE_PROVIDER_SITE_OTHER): Payer: Medicare Other | Admitting: Plastic Surgery

## 2020-05-02 VITALS — BP 120/80 | HR 76 | Temp 98.3°F

## 2020-05-02 DIAGNOSIS — M7989 Other specified soft tissue disorders: Secondary | ICD-10-CM

## 2020-05-06 ENCOUNTER — Telehealth: Payer: Self-pay | Admitting: Neurology

## 2020-05-06 ENCOUNTER — Other Ambulatory Visit: Payer: Self-pay | Admitting: *Deleted

## 2020-05-06 DIAGNOSIS — G35 Multiple sclerosis: Secondary | ICD-10-CM | POA: Diagnosis not present

## 2020-05-06 DIAGNOSIS — N390 Urinary tract infection, site not specified: Secondary | ICD-10-CM

## 2020-05-06 NOTE — Progress Notes (Signed)
u

## 2020-05-06 NOTE — Telephone Encounter (Signed)
Pt called wanting to know if she can come in to get Solu-Medrol. Spoke to Dr. Felecia Shelling and he okayed to give a gram. Please advise.

## 2020-05-06 NOTE — Addendum Note (Signed)
Addended by: Inis Sizer D on: 05/06/2020 01:30 PM   Modules accepted: Orders

## 2020-05-06 NOTE — Telephone Encounter (Signed)
Called pt. [facility] can fit her in now or at noon. Pt accepted noon. I gave signed orders to [facility] for 1G solumedrol x1day.

## 2020-05-07 DIAGNOSIS — N390 Urinary tract infection, site not specified: Secondary | ICD-10-CM | POA: Diagnosis not present

## 2020-05-08 NOTE — Progress Notes (Signed)
Cloudy urine sample, awaiting full culture report.

## 2020-05-13 ENCOUNTER — Other Ambulatory Visit: Payer: Self-pay | Admitting: Neurology

## 2020-05-13 ENCOUNTER — Telehealth: Payer: Self-pay | Admitting: *Deleted

## 2020-05-13 LAB — URINALYSIS, ROUTINE W REFLEX MICROSCOPIC
Bilirubin, UA: NEGATIVE
Glucose, UA: NEGATIVE
Ketones, UA: NEGATIVE
Leukocytes,UA: NEGATIVE
Nitrite, UA: NEGATIVE
Protein,UA: NEGATIVE
RBC, UA: NEGATIVE
Specific Gravity, UA: 1.018 (ref 1.005–1.030)
Urobilinogen, Ur: 0.2 mg/dL (ref 0.2–1.0)
pH, UA: 7 (ref 5.0–7.5)

## 2020-05-13 LAB — URINE CULTURE

## 2020-05-13 NOTE — Telephone Encounter (Signed)
LVM for pt to call office

## 2020-05-13 NOTE — Telephone Encounter (Signed)
-----   Message from Britt Bottom, MD sent at 05/13/2020  3:11 PM EST ----- Has mild UTI.   We can call in amoxicillin 250 mg po tid   #21   #0

## 2020-05-14 MED ORDER — AMOXICILLIN 250 MG PO CAPS: 250.0000 mg | ORAL_CAPSULE | Freq: Three times a day (TID) | ORAL | 0 refills | Status: DC

## 2020-05-14 NOTE — Telephone Encounter (Signed)
Called pt at [phone]. Relayed results per Dr. Felecia Shelling note. Pt verbalized understanding. I e-scribed rx to [facility], [address] AT [address] [place]. Pt will call if sx do not improve.

## 2020-05-27 ENCOUNTER — Other Ambulatory Visit: Payer: Self-pay | Admitting: Neurology

## 2020-06-06 ENCOUNTER — Ambulatory Visit: Payer: Medicare Other | Admitting: Neurology

## 2020-06-06 ENCOUNTER — Encounter: Payer: Self-pay | Admitting: Neurology

## 2020-06-06 VITALS — BP 101/69 | HR 81

## 2020-06-06 DIAGNOSIS — R42 Dizziness and giddiness: Secondary | ICD-10-CM

## 2020-06-06 DIAGNOSIS — R29898 Other symptoms and signs involving the musculoskeletal system: Secondary | ICD-10-CM | POA: Diagnosis not present

## 2020-06-06 DIAGNOSIS — R208 Other disturbances of skin sensation: Secondary | ICD-10-CM

## 2020-06-06 DIAGNOSIS — Z7952 Long term (current) use of systemic steroids: Secondary | ICD-10-CM

## 2020-06-06 DIAGNOSIS — G35 Multiple sclerosis: Secondary | ICD-10-CM

## 2020-06-06 MED ORDER — ESCITALOPRAM OXALATE 10 MG PO TABS: 10.0000 mg | ORAL_TABLET | Freq: Every day | ORAL | 4 refills | Status: DC

## 2020-06-06 MED ORDER — AMPHETAMINE-DEXTROAMPHETAMINE 20 MG PO TABS
20.0000 mg | ORAL_TABLET | Freq: Two times a day (BID) | ORAL | 0 refills | Status: DC
Start: 2020-06-06 — End: 2020-07-16

## 2020-06-06 NOTE — Progress Notes (Signed)
[facility]  PATIENT: Tonya Powers DOB: August 24, 1960  REFERRING CLINICIAN: Mayra Neer is PCP HISTORY FROM: Patient  REASON FOR VISIT: MS and poor gait   HISTORICAL  CHIEF COMPLAINT:  Chief Complaint  Patient presents with  . Follow-up    RM 12, alone. In electric scooter today.     HISTORY OF PRESENT ILLNESS:  Tonya Powers is a 59 y.o. woman with an active form of secondary progressive MS.      Update 06/06/20 She has not had any severe episode of vertigo since 8-9 months ago.  She takes dramamine.   She uses her scooter mostly since her leg fracture as she can not go far with a walker (10 feet).    She uses a pool some as well when she can.   She was doing better before she fractured her right leg in December 2020.    She transfers well.     Her left leg is weaker than the right leg.    She gets a lot of pain in legs, left > right.   Pain is burning and covers rubbing over the skin worsens the intensity.    She has trouble sleeping when pain is worse .    She has dysesthesias in her feet and abdomen.  She is is on gabapentin, lamotrigine and tramadol.   Lidocaine ointment had not helped.  Sometimes she uses ice with some benefit.   She has MS related ADD and fatigue, helped by Adderall 20 mg po bid.    She is off any DMT.  Ocrevus made her feel weaker and she had previously been on Betaseron.  She had her Covid-19 vaccination and she will be getting her booster.     MS History:  She presented with optic neuritis in 1991 followed shortly by difficulties with leg weakness and by right trigeminal neuralgia. In retrospect, couple years earlier when she had her daughter, has some difficulties with her legs and needed to go on short-term disability. At first she was not diagnosed with MS but after more of the symptoms she underwent MRI testing and had a lumbar puncture by Dr. Johnnye Sima. The imaging and the CSF was consistent with multiple sclerosis. When Betaseron  became available she was placed on that. She was on Betaseron for about 10 years. She felt that she did not have too many exacerbations during that time but she had a lot of difficulty tolerating the Betaseron due to skin reactions. Around 12-15 years ago, she started to use a cane and she has had progressive gait disturbance over the last decade. Around the house for short distances, she uses a walker but uses her scooter for longer distances. Outside she uses a wheelchair pushed by others   She tried Ocrevus in 2018 but felt worse so stopped in 2020.  She has done Solu-Medrol  .  MRI Brain 02/28/2016 showed T2/FLAIR hyperintense foci in the periventricular, juxtacortical and deep white matter of both hemispheres and in the left cerebellar hemisphere in a pattern and configuration consistent with chronic demyelinating plaque associated with multiple sclerosis. None of the foci enhances. Compared to the MRI from 04/04/2015, there is no interval change.  MRI Cervical spine showed  multiple lesions within the spinal cord adjacent to C2-C3, C3, C4-C5, C5-C6 and C7-T1. When compared to the MRI dated 04/04/2015, these lesions appear to be chronic.  REVIEW OF SYSTEMS:  Constitutional: No fevers, chills, sweats, or change in appetite.  She has Fatigue  Eyes: No visual changes, double vision, eye pain Ear, nose and throat: No hearing loss, ear pain, nasal congestion, sore throat Cardiovascular: No chest pain, palpitations Respiratory:  No shortness of breath at rest or with exertion.   No wheezes GastrointestinaI: Mild dysphagia.  Constipation.  No nausea, vomiting, diarrhea.   Genitourinary:  No dysuria but has urinary retention and frequency.  Desmopressin helps nocturia. Musculoskeletal:  No neck pain,   Has lower back pain and buttocks.  Hips ans other joints ok Integumentary: No rash, pruritus, skin lesions Neurological: as above Psychiatric: see above Endocrine: No palpitations, diaphoresis, change in  appetite, change in weigh or increased thirst Hematologic/Lymphatic:  No anemia, purpura, petechiae. Allergic/Immunologic: No itchy/runny eyes, nasal congestion, recent allergic reactions, rashes  ALLERGIES: Allergies  Allergen Reactions  . Codeine Nausea And Vomiting    HOME MEDICATIONS: Outpatient Medications Prior to Visit  Medication Sig Dispense Refill  . acetaminophen (TYLENOL) 500 MG tablet Take 1,000 mg by mouth daily as needed for pain.    Marland Kitchen amoxicillin (AMOXIL) 250 MG capsule Take 1 capsule (250 mg total) by mouth 3 (three) times daily. 21 capsule 0  . Cholecalciferol (VITAMIN D PO) Take 1 tablet by mouth daily.    . clonazePAM (KLONOPIN) 1 MG tablet TAKE 1 TABLET(1 MG) BY MOUTH AT BEDTIME AS NEEDED 30 tablet 3  . diazepam (VALIUM) 5 MG tablet TAKE 1 TABLET BY MOUTH EVERY 8 HOURS AS NEEDED 90 tablet 5  . diclofenac (VOLTAREN) 75 MG EC tablet TAKE 1 TABLET(75 MG) BY MOUTH TWICE DAILY. DO NOT TAKE WITH IBUPROFEN 60 tablet 3  . estradiol (CLIMARA - DOSED IN MG/24 HR) 0.1 mg/24hr patch Place 0.1 mg onto the skin once a week.  5  . gabapentin (NEURONTIN) 800 MG tablet TAKE 1 TABLET(800 MG) BY MOUTH FOUR TIMES DAILY 120 tablet 11  . ibuprofen (ADVIL,MOTRIN) 200 MG tablet Take 400 mg by mouth every 6 (six) hours as needed for headache (headache).    . lamoTRIgine (LAMICTAL) 200 MG tablet TAKE 1 TABLET BY MOUTH THREE TIMES DAILY 270 tablet 3  . lidocaine (LIDODERM) 5 % Place 1 patch daily onto the skin. Remove & Discard patch within 12 hours or as directed by MD 30 patch 11  . meclizine (ANTIVERT) 25 MG tablet TAKE 1 TABLET(25 MG) BY MOUTH THREE TIMES DAILY AS NEEDED FOR DIZZINESS 30 tablet 0  . omeprazole (PRILOSEC) 40 MG capsule Take 40 mg by mouth 2 (two) times daily.    . promethazine (PHENERGAN) 25 MG tablet Take 1 tablet (25 mg total) by mouth every 6 (six) hours as needed for nausea (nausea). 30 tablet 3  . rOPINIRole (REQUIP) 0.5 MG tablet TAKE 1 TO 2 TABLETS BY MOUTH AT BEDTIME  60 tablet 5  . traMADol (ULTRAM) 50 MG tablet TAKE 1 TABLET BY MOUTH FOUR TIMES DAILY AS NEEDED FOR MODERATE PAIN 120 tablet 5  . zolpidem (AMBIEN) 5 MG tablet Take 1 tablet (5 mg total) by mouth at bedtime as needed for sleep. 30 tablet 3  . amphetamine-dextroamphetamine (ADDERALL) 20 MG tablet Take 1 tablet (20 mg total) by mouth 2 (two) times daily. 60 tablet 0  . sertraline (ZOLOFT) 50 MG tablet TAKE 1 TABLET(50 MG) BY MOUTH DAILY 30 tablet 11   No facility-administered medications prior to visit.    PAST MEDICAL HISTORY: Past Medical History:  Diagnosis Date  . Anxiety   . Depression   . Gait instability    uses wheelchair or assistance  .  H/O steroid therapy    IV infusion every 6 to 8 weeks- last 05-31-14 [facility]  . History of breast biopsy   . Multiple sclerosis ([place])   . Neuromuscular disorder ([place])    MS  . PONV (postoperative nausea and vomiting)   . Skin cancer     PAST SURGICAL HISTORY: Past Surgical History:  Procedure Laterality Date  . ABDOMINAL HYSTERECTOMY    . BREAST BIOPSY Right 09/20/2012   Procedure: BREAST BIOPSY WITH NEEDLE LOCALIZATION;  Surgeon: Rolm Bookbinder, MD;  Location: [facility];  Service: General;  Laterality: Right;  . BREAST EXCISIONAL BIOPSY Right    benign  . CESAREAN SECTION    . COLONOSCOPY WITH PROPOFOL N/A 07/13/2014   Procedure: COLONOSCOPY WITH PROPOFOL;  Surgeon: Beryle Beams, MD;  Location: [facility];  Service: Endoscopy;  Laterality: N/A;  . DEBRIDEMENT SKIN / SQ / MUSCLE OF ARM Right 07/13/2003   I & D; debridement of tissue within triceps muscle  . LEG SURGERY     sx as a child  . MELANOMA EXCISION    . PARTIAL HYSTERECTOMY    . TONSILLECTOMY     as a child    FAMILY HISTORY: Family History  Problem Relation Age of Onset  . Hyperlipidemia Mother   . Hypertension Mother   . Hypertension Father   . Diabetes type II Father     SOCIAL HISTORY:  Social History   Socioeconomic  History  . Marital status: Married    Spouse name: Not on file  . Number of children: Not on file  . Years of education: Not on file  . Highest education level: Not on file  Occupational History  . Not on file  Tobacco Use  . Smoking status: Never Smoker  . Smokeless tobacco: Never Used  Vaping Use  . Vaping Use: Never used  Substance and Sexual Activity  . Alcohol use: Not Currently    Comment: social  . Drug use: No  . Sexual activity: Not on file  Other Topics Concern  . Not on file  Social History Narrative   Lives at home w/ her husband   Social Determinants of Health   Financial Resource Strain: Not on file  Food Insecurity: Not on file  Transportation Needs: Not on file  Physical Activity: Not on file  Stress: Not on file  Social Connections: Not on file  Intimate Partner Violence: Not on file     PHYSICAL EXAM  Vitals:   06/06/20 1304  BP: 101/69  Pulse: 81  SpO2: 95%    There is no height or weight on file to calculate BMI.   General: The patient is well-developed and well-nourished and in no acute distress   Skin/Ext/Musculoskeletal: There is mild pedal edema..   Currently there is not significant neck tenderness. Neurologic Exam  Mental status: The patient is alert and oriented x 3 at the time of the examination. The patient has apparent normal recent and remote memory, with an apparently normal attention span and concentration ability.   Speech is normal.  Cranial nerves: Extraocular movements are full.  Facial strength and sensation was normal.  Trapezius strength is normal.. No dysarthria is noted.  No obvious hearing deficits are noted.  Motor:  Muscle bulk and tone are normal.  Strength is 2+/5 left and 3/5 right proximal leg strength.  Strength is 4- to 4/5 distally.  The arms are fairly strong.  Sensory: She has normal sensation to touch and  vibration in the arms but reduced touch sensation in the left leg relative to the  right  Coordination: Cerebellar testing reveals reduced finger-nose-finger and poor heel-to-shin bilaterally.  Gait and station: She needs support to stand.  She is unable to walk.  Reflexes: Deep tendon reflexes are symmetric and increased in legs bilaterally with spread at the knees.  There is nonsustained ankle clonus bilaterally.    DIAGNOSTIC DATA (LABS, IMAGING, TESTING) - I reviewed patient records, labs, notes, testing and imaging myself where available.  Lab Results  Component Value Date   WBC 8.5 06/07/2019   HGB 13.9 06/07/2019   HCT 40.8 06/07/2019   MCV 91 06/07/2019   PLT 271 06/07/2019      Component Value Date/Time   NA 144 06/07/2019 1519   K 5.2 06/07/2019 1519   CL 100 06/07/2019 1519   CO2 27 06/07/2019 1519   GLUCOSE 95 06/07/2019 1519   GLUCOSE 104 (H) 03/07/2018 1138   BUN 15 06/07/2019 1519   CREATININE 0.70 06/07/2019 1519   CALCIUM 10.0 06/07/2019 1519   PROT 6.9 06/07/2019 1519   ALBUMIN 4.9 06/07/2019 1519   AST 19 06/07/2019 1519   ALT 13 06/07/2019 1519   ALKPHOS 101 06/07/2019 1519   BILITOT 0.2 06/07/2019 1519   GFRNONAA 94 06/07/2019 1519   GFRAA 109 06/07/2019 1519      ASSESSMENT AND PLAN  Long term systemic steroid user - Plan: DG BONE DENSITY (DXA)  Multiple sclerosis ([facility])  Weakness of both lower extremities  Vertigo  Dysesthesia   1.   She remains off a disease modifying therapy.  She would like to continue to get IV Solu-Medrol every 6 weeks or so.  We can do an additional dose before Christmas.  I do want to check the bone density as he had had osteopenia in the past. 2.  Adderall has helped her fatigue and attention deficit.  I will renew 3.   Clonazepam nightly for spasticity and insomnia.  Tramadol for pain. 4.  Continue gabapentin and lamotrigine for dysesthesias. 5.   She has gained some weight.  Change Zoloft to Escitalopram to see if this improves.   6.   she will return to see me in 5-6 months or sooner if  she has new or worsening neurologic symptoms.    Tonya Powers A. Felecia Shelling, MD, PhD 77/09/1421, 9:53 PM Certified in Neurology, Clinical Neurophysiology, Sleep Medicine, Pain Medicine and Neuroimaging  [facility] [address] [place], [place] [address] [phone]

## 2020-06-12 ENCOUNTER — Telehealth: Payer: Self-pay | Admitting: Neurology

## 2020-06-12 DIAGNOSIS — G35 Multiple sclerosis: Secondary | ICD-10-CM | POA: Diagnosis not present

## 2020-06-12 NOTE — Telephone Encounter (Signed)
06/12/2020 Sent to [facility] they will call patient to sch. thanks Ramona Fax - Telephone [phone]

## 2020-06-27 ENCOUNTER — Other Ambulatory Visit: Payer: Self-pay | Admitting: Family Medicine

## 2020-06-27 DIAGNOSIS — Z1231 Encounter for screening mammogram for malignant neoplasm of breast: Secondary | ICD-10-CM

## 2020-07-01 ENCOUNTER — Other Ambulatory Visit: Payer: Self-pay | Admitting: Neurology

## 2020-07-03 DIAGNOSIS — L723 Sebaceous cyst: Secondary | ICD-10-CM | POA: Diagnosis not present

## 2020-07-16 ENCOUNTER — Other Ambulatory Visit: Payer: Self-pay | Admitting: Neurology

## 2020-07-16 MED ORDER — AMPHETAMINE-DEXTROAMPHETAMINE 20 MG PO TABS
20.0000 mg | ORAL_TABLET | Freq: Two times a day (BID) | ORAL | 0 refills | Status: DC
Start: 2020-07-16 — End: 2020-08-31

## 2020-07-16 NOTE — Telephone Encounter (Signed)
Pt has called for a refill on her amphetamine-dextroamphetamine (ADDERALL) 20 MG tablet to [facility]

## 2020-07-16 NOTE — Addendum Note (Signed)
Addended by: Wyvonnia Lora on: 07/16/2020 01:03 PM   Modules accepted: Orders

## 2020-08-06 ENCOUNTER — Ambulatory Visit
Admission: RE | Admit: 2020-08-06 | Discharge: 2020-08-06 | Disposition: A | Discharge status: Home / Self Care | Payer: Medicare Other | Source: Ambulatory Visit | Attending: Family Medicine | Admitting: Family Medicine

## 2020-08-06 ENCOUNTER — Other Ambulatory Visit: Payer: Self-pay

## 2020-08-06 ENCOUNTER — Other Ambulatory Visit: Payer: Self-pay | Admitting: Family Medicine

## 2020-08-06 ENCOUNTER — Ambulatory Visit: Payer: Medicare Other

## 2020-08-06 DIAGNOSIS — Z1231 Encounter for screening mammogram for malignant neoplasm of breast: Secondary | ICD-10-CM

## 2020-08-07 DIAGNOSIS — G35 Multiple sclerosis: Secondary | ICD-10-CM | POA: Diagnosis not present

## 2020-08-07 DIAGNOSIS — G909 Disorder of the autonomic nervous system, unspecified: Secondary | ICD-10-CM | POA: Diagnosis not present

## 2020-08-07 DIAGNOSIS — Z0000 Encounter for general adult medical examination without abnormal findings: Secondary | ICD-10-CM | POA: Diagnosis not present

## 2020-08-07 DIAGNOSIS — G4700 Insomnia, unspecified: Secondary | ICD-10-CM | POA: Diagnosis not present

## 2020-08-14 ENCOUNTER — Other Ambulatory Visit: Payer: Self-pay | Admitting: Neurology

## 2020-08-31 ENCOUNTER — Other Ambulatory Visit: Payer: Self-pay

## 2020-09-02 MED ORDER — AMPHETAMINE-DEXTROAMPHETAMINE 20 MG PO TABS
20.0000 mg | ORAL_TABLET | Freq: Two times a day (BID) | ORAL | 0 refills | Status: DC
Start: 2020-09-02 — End: 2020-10-11

## 2020-09-16 DIAGNOSIS — G35 Multiple sclerosis: Secondary | ICD-10-CM | POA: Diagnosis not present

## 2020-09-16 NOTE — Telephone Encounter (Signed)
LVM at [phone] for pt to call back. Called [phone] and spoke with pt. She will come today at 1230p. MD approved infusion.

## 2020-10-04 ENCOUNTER — Other Ambulatory Visit: Payer: Self-pay | Admitting: Neurology

## 2020-10-04 DIAGNOSIS — Z85828 Personal history of other malignant neoplasm of skin: Secondary | ICD-10-CM | POA: Diagnosis not present

## 2020-10-04 DIAGNOSIS — C44529 Squamous cell carcinoma of skin of other part of trunk: Secondary | ICD-10-CM | POA: Diagnosis not present

## 2020-10-04 DIAGNOSIS — L929 Granulomatous disorder of the skin and subcutaneous tissue, unspecified: Secondary | ICD-10-CM | POA: Diagnosis not present

## 2020-10-04 DIAGNOSIS — C44311 Basal cell carcinoma of skin of nose: Secondary | ICD-10-CM | POA: Diagnosis not present

## 2020-10-04 DIAGNOSIS — L814 Other melanin hyperpigmentation: Secondary | ICD-10-CM | POA: Diagnosis not present

## 2020-10-04 DIAGNOSIS — D485 Neoplasm of uncertain behavior of skin: Secondary | ICD-10-CM | POA: Diagnosis not present

## 2020-10-04 DIAGNOSIS — D225 Melanocytic nevi of trunk: Secondary | ICD-10-CM | POA: Diagnosis not present

## 2020-10-11 ENCOUNTER — Other Ambulatory Visit: Payer: Self-pay

## 2020-10-14 MED ORDER — AMPHETAMINE-DEXTROAMPHETAMINE 20 MG PO TABS: 20.0000 mg | ORAL_TABLET | Freq: Two times a day (BID) | ORAL | 0 refills | Status: DC

## 2020-10-23 DIAGNOSIS — Z1211 Encounter for screening for malignant neoplasm of colon: Secondary | ICD-10-CM | POA: Diagnosis not present

## 2020-11-03 ENCOUNTER — Other Ambulatory Visit: Payer: Self-pay | Admitting: Neurology

## 2020-11-21 DIAGNOSIS — K219 Gastro-esophageal reflux disease without esophagitis: Secondary | ICD-10-CM | POA: Diagnosis not present

## 2020-11-21 DIAGNOSIS — R195 Other fecal abnormalities: Secondary | ICD-10-CM | POA: Diagnosis not present

## 2020-11-21 DIAGNOSIS — R635 Abnormal weight gain: Secondary | ICD-10-CM | POA: Diagnosis not present

## 2020-11-21 DIAGNOSIS — R1310 Dysphagia, unspecified: Secondary | ICD-10-CM | POA: Diagnosis not present

## 2020-11-22 ENCOUNTER — Other Ambulatory Visit: Payer: Self-pay | Admitting: Gastroenterology

## 2020-11-22 ENCOUNTER — Other Ambulatory Visit: Payer: Self-pay

## 2020-11-22 MED ORDER — AMPHETAMINE-DEXTROAMPHETAMINE 20 MG PO TABS: 20.0000 mg | ORAL_TABLET | Freq: Two times a day (BID) | ORAL | 0 refills | Status: DC

## 2020-11-27 DIAGNOSIS — Z85828 Personal history of other malignant neoplasm of skin: Secondary | ICD-10-CM | POA: Diagnosis not present

## 2020-11-27 DIAGNOSIS — L309 Dermatitis, unspecified: Secondary | ICD-10-CM | POA: Diagnosis not present

## 2020-12-05 ENCOUNTER — Other Ambulatory Visit: Payer: Self-pay

## 2020-12-05 ENCOUNTER — Ambulatory Visit: Payer: Medicare Other | Admitting: Podiatry

## 2020-12-05 ENCOUNTER — Ambulatory Visit: Payer: Medicare Other

## 2020-12-05 DIAGNOSIS — M6281 Muscle weakness (generalized): Secondary | ICD-10-CM | POA: Diagnosis not present

## 2020-12-05 DIAGNOSIS — R2681 Unsteadiness on feet: Secondary | ICD-10-CM | POA: Diagnosis not present

## 2020-12-05 DIAGNOSIS — B351 Tinea unguium: Secondary | ICD-10-CM

## 2020-12-05 DIAGNOSIS — M79674 Pain in right toe(s): Secondary | ICD-10-CM | POA: Diagnosis not present

## 2020-12-05 DIAGNOSIS — G35 Multiple sclerosis: Secondary | ICD-10-CM | POA: Diagnosis not present

## 2020-12-05 DIAGNOSIS — M79675 Pain in left toe(s): Secondary | ICD-10-CM | POA: Diagnosis not present

## 2020-12-05 NOTE — Telephone Encounter (Signed)
LVM for pt at [phone]. Called [phone] and spoke w/ pt. Confirmed she will be here at 1pm for infusion

## 2020-12-10 ENCOUNTER — Ambulatory Visit: Payer: Medicare Other | Admitting: Neurology

## 2020-12-10 ENCOUNTER — Encounter: Payer: Self-pay | Admitting: Neurology

## 2020-12-10 VITALS — BP 110/70 | HR 71 | Ht 65.0 in

## 2020-12-10 DIAGNOSIS — R208 Other disturbances of skin sensation: Secondary | ICD-10-CM

## 2020-12-10 DIAGNOSIS — G35 Multiple sclerosis: Secondary | ICD-10-CM | POA: Diagnosis not present

## 2020-12-10 DIAGNOSIS — Z7952 Long term (current) use of systemic steroids: Secondary | ICD-10-CM

## 2020-12-10 DIAGNOSIS — G90523 Complex regional pain syndrome I of lower limb, bilateral: Secondary | ICD-10-CM

## 2020-12-10 DIAGNOSIS — R600 Localized edema: Secondary | ICD-10-CM

## 2020-12-10 DIAGNOSIS — M5431 Sciatica, right side: Secondary | ICD-10-CM | POA: Diagnosis not present

## 2020-12-10 DIAGNOSIS — R29898 Other symptoms and signs involving the musculoskeletal system: Secondary | ICD-10-CM | POA: Diagnosis not present

## 2020-12-10 MED ORDER — NIFEDIPINE ER OSMOTIC RELEASE 30 MG PO TB24: 30.0000 mg | ORAL_TABLET | Freq: Every day | ORAL | 11 refills | Status: DC

## 2020-12-10 NOTE — Progress Notes (Signed)
[facility]  PATIENT: Tonya Powers DOB: 1961-01-26  REFERRING CLINICIAN: Mayra Neer is PCP HISTORY FROM: Patient  REASON FOR VISIT: MS and poor gait   HISTORICAL  CHIEF COMPLAINT:  Chief Complaint  Patient presents with   Follow-up    RM 13, alone. Last seen 06/06/2020. 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 Got IV steroids 12/05/20, helped with energy. Off DMT. Has severe foot pain still, been to vein and vascular specialist/podiatrist who told her it is nerve related. She is having increased acid reflux. She is unable to eat a lot of foods. She has colonoscopy/endoscopy scheduled at [facility] this Friday.    HISTORY OF PRESENT ILLNESS:  Tonya Powers is a 60 y.o. woman with an active form of secondary progressive MS.      Update 12/10/4020 She is having more swelling in her feet.   Feet feel hot but are usually cold to the touch.    Skin is red.   Occasioanlly, feet feel hot to the touch if outside.   She has seen vascular and felt not to be due to a vascular issue.   She has dysesthesias in her feet and abdomen.  Wearing socks or pants is uncomfortable and she usually just wears a nightgown.   She is is on gabapentin, lamotrigine and tramadol.   Lidocaine ointment had not helped.  Sometimes she uses ice with some benefit.   ABI 09/04/2019 showed: Right: Resting right ankle-brachial index is within normal range. No evidence of significant right lower extremity arterial disease. The right toe-brachial index is normal. RT great toe pressure = 140 mmHg. Left: Resting left ankle-brachial index is within normal range. No evidence of significant left lower extremity arterial disease. The left toe-brachial index is normal. LT Great toe pressure = 137 mmHg.  Venous Ultrasound 06/27/2019 was normal.     She has not had any severe episode of vertigo over the last year.  She had a small spell after an IV steroid dose.     She uses her scooter mostly since her leg fracture as she  can not go far with a walker (10 feet).    She uses a pool some as well when she can.   She was doing better before she fractured her right leg in December 2020.    She transfers well.     Her left leg is weaker than the right leg.    She gets a lot of pain in legs, left > right.   Pain is burning and covers rubbing over the skin worsens the intensity.    She has trouble sleeping when pain is worse .      She has MS related ADD and fatigue, helped by Adderall 20 mg po bid.    She is off any DMT.  Ocrevus made her feel weaker and she had previously been on Betaseron.  She had her Covid-19 vaccination and she will be getting her booster.     MS History:  She presented with optic neuritis in 1991 followed shortly by difficulties with leg weakness and by right trigeminal neuralgia. In retrospect, couple years earlier when she had her daughter, has some difficulties with her legs and needed to go on short-term disability. At first she was not diagnosed with MS but after more of the symptoms she underwent MRI testing and had a lumbar puncture by Dr. Johnnye Sima. The imaging and the CSF was consistent with multiple sclerosis. When Betaseron became available she was placed on that.  She was on Betaseron for about 10 years. She felt that she did not have too many exacerbations during that time but she had a lot of difficulty tolerating the Betaseron due to skin reactions. Around 12-15 years ago, she started to use a cane and she has had progressive gait disturbance over the last decade. Around the house for short distances, she uses a walker but uses her scooter for longer distances. Outside she uses a wheelchair pushed by others   She tried Ocrevus in 2018 but felt worse so stopped in 2020.  She has done Solu-Medrol  .  MRI Brain 02/28/2016 showed T2/FLAIR hyperintense foci in the periventricular, juxtacortical and deep white matter of both hemispheres and in the left cerebellar hemisphere in a pattern and  configuration consistent with chronic demyelinating plaque associated with multiple sclerosis. None of the foci enhances. Compared to the MRI from 04/04/2015, there is no interval change.  MRI Cervical spine showed  multiple lesions within the spinal cord adjacent to C2-C3, C3, C4-C5, C5-C6 and C7-T1. When compared to the MRI dated 04/04/2015, these lesions appear to be chronic.  REVIEW OF SYSTEMS:  Constitutional: No fevers, chills, sweats, or change in appetite.  She has Fatigue Eyes: No visual changes, double vision, eye pain Ear, nose and throat: No hearing loss, ear pain, nasal congestion, sore throat Cardiovascular: No chest pain, palpitations Respiratory:  No shortness of breath at rest or with exertion.   No wheezes GastrointestinaI: Mild dysphagia.  Constipation.  No nausea, vomiting, diarrhea.   Genitourinary:  No dysuria but has urinary retention and frequency.  Desmopressin helps nocturia. Musculoskeletal:  No neck pain,   Has lower back pain and buttocks.  Hips ans other joints ok Integumentary: No rash, pruritus, skin lesions Neurological: as above Psychiatric: see above Endocrine: No palpitations, diaphoresis, change in appetite, change in weigh or increased thirst Hematologic/Lymphatic:  No anemia, purpura, petechiae. Allergic/Immunologic: No itchy/runny eyes, nasal congestion, recent allergic reactions, rashes  ALLERGIES: Allergies  Allergen Reactions   Codeine Nausea And Vomiting    HOME MEDICATIONS: Outpatient Medications Prior to Visit  Medication Sig Dispense Refill   acetaminophen (TYLENOL) 500 MG tablet Take 1,000 mg by mouth daily as needed for pain.     amphetamine-dextroamphetamine (ADDERALL) 20 MG tablet Take 1 tablet (20 mg total) by mouth 2 (two) times daily. 60 tablet 0   Cholecalciferol (VITAMIN D PO) Take 1 tablet by mouth daily.     clonazePAM (KLONOPIN) 1 MG tablet TAKE 1 TABLET(1 MG) BY MOUTH AT BEDTIME AS NEEDED 30 tablet 3   diazepam (VALIUM) 5 MG  tablet TAKE 1 TABLET BY MOUTH EVERY 8 HOURS AS NEEDED 90 tablet 5   escitalopram (LEXAPRO) 10 MG tablet Take 1 tablet (10 mg total) by mouth daily. 90 tablet 4   gabapentin (NEURONTIN) 800 MG tablet TAKE 1 TABLET(800 MG) BY MOUTH FOUR TIMES DAILY 120 tablet 11   lamoTRIgine (LAMICTAL) 200 MG tablet TAKE 1 TABLET BY MOUTH THREE TIMES DAILY 270 tablet 3   lidocaine (LIDODERM) 5 % Place 1 patch daily onto the skin. Remove & Discard patch within 12 hours or as directed by MD 30 patch 11   meclizine (ANTIVERT) 25 MG tablet TAKE 1 TABLET(25 MG) BY MOUTH THREE TIMES DAILY AS NEEDED FOR DIZZINESS 30 tablet 0   omeprazole (PRILOSEC) 40 MG capsule Take 40 mg by mouth 2 (two) times daily.     promethazine (PHENERGAN) 25 MG tablet Take 1 tablet (25 mg total) by mouth every  6 (six) hours as needed for nausea (nausea). 30 tablet 3   rOPINIRole (REQUIP) 0.5 MG tablet TAKE 1 TO 2 TABLETS BY MOUTH AT BEDTIME 60 tablet 5   traMADol (ULTRAM) 50 MG tablet TAKE 1 TABLET BY MOUTH FOUR TIMES DAILY AS NEEDED FOR MODERATE PAIN 120 tablet 5   zolpidem (AMBIEN) 5 MG tablet TAKE 1 TABLET(5 MG) BY MOUTH AT BEDTIME AS NEEDED FOR SLEEP 30 tablet 3   amoxicillin (AMOXIL) 250 MG capsule Take 1 capsule (250 mg total) by mouth 3 (three) times daily. 21 capsule 0   diclofenac (VOLTAREN) 75 MG EC tablet TAKE 1 TABLET(75 MG) BY MOUTH TWICE DAILY. DO NOT TAKE WITH IBUPROFEN 60 tablet 3   estradiol (CLIMARA - DOSED IN MG/24 HR) 0.1 mg/24hr patch Place 0.1 mg onto the skin once a week.  5   ibuprofen (ADVIL,MOTRIN) 200 MG tablet Take 400 mg by mouth every 6 (six) hours as needed for headache (headache).     No facility-administered medications prior to visit.    PAST MEDICAL HISTORY: Past Medical History:  Diagnosis Date   Anxiety    Depression    Gait instability    uses wheelchair or assistance   H/O steroid therapy    IV infusion every 6 to 8 weeks- last 05-31-14 [facility]   History of breast biopsy    Multiple  sclerosis ([place])    Neuromuscular disorder ([place])    MS   PONV (postoperative nausea and vomiting)    Skin cancer     PAST SURGICAL HISTORY: Past Surgical History:  Procedure Laterality Date   ABDOMINAL HYSTERECTOMY     BREAST BIOPSY Right 09/20/2012   Procedure: BREAST BIOPSY WITH NEEDLE LOCALIZATION;  Surgeon: Rolm Bookbinder, MD;  Location: [place];  Service: General;  Laterality: Right;   BREAST EXCISIONAL BIOPSY Right    benign   CESAREAN SECTION     COLONOSCOPY WITH PROPOFOL N/A 07/13/2014   Procedure: COLONOSCOPY WITH PROPOFOL;  Surgeon: Beryle Beams, MD;  Location: [facility];  Service: Endoscopy;  Laterality: N/A;   DEBRIDEMENT SKIN / SQ / MUSCLE OF ARM Right 07/13/2003   I & D; debridement of tissue within triceps muscle   LEG SURGERY     sx as a child   MELANOMA EXCISION     PARTIAL HYSTERECTOMY     TONSILLECTOMY     as a child    FAMILY HISTORY: Family History  Problem Relation Age of Onset   Hyperlipidemia Mother    Hypertension Mother    Hypertension Father    Diabetes type II Father     SOCIAL HISTORY:  Social History   Socioeconomic History   Marital status: Married    Spouse name: Not on file   Number of children: Not on file   Years of education: Not on file   Highest education level: Not on file  Occupational History   Not on file  Tobacco Use   Smoking status: Never   Smokeless tobacco: Never  Vaping Use   Vaping Use: Never used  Substance and Sexual Activity   Alcohol use: Not Currently    Comment: social   Drug use: No   Sexual activity: Not on file  Other Topics Concern   Not on file  Social History Narrative   Lives at home w/ her husband   Social Determinants of Health   Financial Resource Strain: Not on file  Food Insecurity: Not on file  Transportation Needs: Not  on file  Physical Activity: Not on file  Stress: Not on file  Social Connections: Not on file  Intimate Partner Violence: Not on file      PHYSICAL EXAM  Vitals:   12/10/20 1303  BP: 110/70  Pulse: 71  SpO2: 95%  Height: 5\' 5"  (1.651 m)    Body mass index is 23.63 kg/m.   General: The patient is well-developed and well-nourished and in no acute distress   Skin/Ext/Musculoskeletal: There is mild pedal edema..   Currently there is not significant neck tenderness. Neurologic Exam  Mental status: The patient is alert and oriented x 3 at the time of the examination. The patient has apparent normal recent and remote memory, with an apparently normal attention span and concentration ability.   Speech is normal.  Cranial nerves: Extraocular movements are full.  Facial strength and sensation was normal.  Trapezius strength is normal.. No dysarthria is noted.  No obvious hearing deficits are noted.  Motor:  Muscle bulk and tone are normal.  Strength is 2+/5 left and 3/5 right proximal leg strength.  Strength is 4- to 4/5 distally.  The arms are fairly strong.  Sensory: She has normal sensation to touch and vibration in the arms but reduced touch sensation in the left leg relative to the right  Coordination: Cerebellar testing reveals reduced finger-nose-finger and poor heel-to-shin bilaterally.  Gait and station: She needs support to stand.  She is unable to walk.  Reflexes: Deep tendon reflexes are symmetric and increased in legs bilaterally with spread at the knees.  There is nonsustained ankle clonus bilaterally.    DIAGNOSTIC DATA (LABS, IMAGING, TESTING) - I reviewed patient records, labs, notes, testing and imaging myself where available.  Lab Results  Component Value Date   WBC 8.5 06/07/2019   HGB 13.9 06/07/2019   HCT 40.8 06/07/2019   MCV 91 06/07/2019   PLT 271 06/07/2019      Component Value Date/Time   NA 144 06/07/2019 1519   K 5.2 06/07/2019 1519   CL 100 06/07/2019 1519   CO2 27 06/07/2019 1519   GLUCOSE 95 06/07/2019 1519   GLUCOSE 104 (H) 03/07/2018 1138   BUN 15 06/07/2019 1519    CREATININE 0.70 06/07/2019 1519   CALCIUM 10.0 06/07/2019 1519   PROT 6.9 06/07/2019 1519   ALBUMIN 4.9 06/07/2019 1519   AST 19 06/07/2019 1519   ALT 13 06/07/2019 1519   ALKPHOS 101 06/07/2019 1519   BILITOT 0.2 06/07/2019 1519   GFRNONAA 94 06/07/2019 1519   GFRAA 109 06/07/2019 1519      ASSESSMENT AND PLAN  Multiple sclerosis (HCC)  Dysesthesia  Weakness of both lower extremities  Long term systemic steroid user  Pedal edema  Complex regional pain syndrome type 1 of both lower extremities   1.   She remains off a disease modifying therapy.  Continue IV Solu-Medrol every 6 weeks or so.   2.   Continue Adderall  for  her fatigue and attention deficit.  3.   Clonazepam nightly for spasticity and insomnia.  Tramadol for pain. 4.   Feet are cold, red and have edema.  Continue gabapentin and lamotrigine for dysesthesias.   Add nifedipine for possible CRPS.   Consider sympathetic block 5.    Trigger point injection of the right piriformis muscle with 60 mg DepoMedrol in [place] using sterile technique. She tolerated the procedure well. There were no complications. 6.  she will return to see me in 6 months or  sooner if she has new or worsening neurologic symptoms.      Ginamarie Banfield A. Felecia Shelling, MD, PhD 1/85/6314, 9:70 PM Certified in Neurology, Clinical Neurophysiology, Sleep Medicine, Pain Medicine and Neuroimaging  [facility] [address] [place], [place] [address] [phone]

## 2020-12-10 NOTE — Progress Notes (Signed)
Attempted to obtain medical history via telephone, unable to reach at this time. I left a voicemail to return pre surgical testing department's phone call.  

## 2020-12-11 ENCOUNTER — Other Ambulatory Visit: Payer: Self-pay | Admitting: Neurology

## 2020-12-11 NOTE — Progress Notes (Signed)
Subjective:   Patient ID: Tonya Powers, female   DOB: 60 y.o.   MRN: [id number]   HPI 60 year old female presents the office today for concerns of swelling to both of her legs as well as weakness.  She gets pain to her feet she describes burning to her foot.  She states that she was seen a neurologist as well as vascular surgery as well.  She denies recent injury or trauma.  Also states is difficult to trim the nails.   Review of Systems  All other systems reviewed and are negative.    Past Medical History:  Diagnosis Date   Anxiety    Depression    Gait instability    uses wheelchair or assistance   H/O steroid therapy    IV infusion every 6 to 8 weeks- last 05-31-14 [facility]   History of breast biopsy    Multiple sclerosis ([place])    Neuromuscular disorder ([place])    MS   PONV (postoperative nausea and vomiting)    Skin cancer     Past Surgical History:  Procedure Laterality Date   ABDOMINAL HYSTERECTOMY     BREAST BIOPSY Right 09/20/2012   Procedure: BREAST BIOPSY WITH NEEDLE LOCALIZATION;  Surgeon: Rolm Bookbinder, MD;  Location: [place];  Service: General;  Laterality: Right;   BREAST EXCISIONAL BIOPSY Right    benign   CESAREAN SECTION     COLONOSCOPY WITH PROPOFOL N/A 07/13/2014   Procedure: COLONOSCOPY WITH PROPOFOL;  Surgeon: Beryle Beams, MD;  Location: [facility];  Service: Endoscopy;  Laterality: N/A;   DEBRIDEMENT SKIN / SQ / MUSCLE OF ARM Right 07/13/2003   I & D; debridement of tissue within triceps muscle   LEG SURGERY     sx as a child   MELANOMA EXCISION     PARTIAL HYSTERECTOMY     TONSILLECTOMY     as a child     Current Outpatient Medications:    acetaminophen (TYLENOL) 500 MG tablet, Take 1,000 mg by mouth daily as needed for pain., Disp: , Rfl:    amphetamine-dextroamphetamine (ADDERALL) 20 MG tablet, Take 1 tablet (20 mg total) by mouth 2 (two) times daily., Disp: 60 tablet, Rfl: 0   Cholecalciferol (VITAMIN D  PO), Take 1 tablet by mouth daily., Disp: , Rfl:    clonazePAM (KLONOPIN) 1 MG tablet, TAKE 1 TABLET(1 MG) BY MOUTH AT BEDTIME AS NEEDED, Disp: 30 tablet, Rfl: 3   diazepam (VALIUM) 5 MG tablet, TAKE 1 TABLET BY MOUTH EVERY 8 HOURS AS NEEDED, Disp: 90 tablet, Rfl: 5   escitalopram (LEXAPRO) 10 MG tablet, Take 1 tablet (10 mg total) by mouth daily., Disp: 90 tablet, Rfl: 4   gabapentin (NEURONTIN) 800 MG tablet, TAKE 1 TABLET(800 MG) BY MOUTH FOUR TIMES DAILY, Disp: 120 tablet, Rfl: 11   lamoTRIgine (LAMICTAL) 200 MG tablet, TAKE 1 TABLET BY MOUTH THREE TIMES DAILY, Disp: 270 tablet, Rfl: 3   lidocaine (LIDODERM) 5 %, Place 1 patch daily onto the skin. Remove & Discard patch within 12 hours or as directed by MD, Disp: 30 patch, Rfl: 11   meclizine (ANTIVERT) 25 MG tablet, TAKE 1 TABLET(25 MG) BY MOUTH THREE TIMES DAILY AS NEEDED FOR DIZZINESS, Disp: 30 tablet, Rfl: 0   NIFEdipine (PROCARDIA-XL/NIFEDICAL-XL) 30 MG 24 hr tablet, Take 1 tablet (30 mg total) by mouth daily., Disp: 30 tablet, Rfl: 11   omeprazole (PRILOSEC) 40 MG capsule, Take 40 mg by mouth 2 (two) times daily., Disp: ,  Rfl:    promethazine (PHENERGAN) 25 MG tablet, Take 1 tablet (25 mg total) by mouth every 6 (six) hours as needed for nausea (nausea)., Disp: 30 tablet, Rfl: 3   rOPINIRole (REQUIP) 0.5 MG tablet, TAKE 1 TO 2 TABLETS BY MOUTH AT BEDTIME, Disp: 60 tablet, Rfl: 5   traMADol (ULTRAM) 50 MG tablet, TAKE 1 TABLET BY MOUTH FOUR TIMES DAILY AS NEEDED FOR MODERATE PAIN, Disp: 120 tablet, Rfl: 5   zolpidem (AMBIEN) 5 MG tablet, TAKE 1 TABLET(5 MG) BY MOUTH AT BEDTIME AS NEEDED FOR SLEEP, Disp: 30 tablet, Rfl: 3  Allergies  Allergen Reactions   Codeine Nausea And Vomiting         Objective:  Physical Exam  General: AAO x3, NAD  Dermatological: Nails are mildly hypertrophic, dystrophic, brittle, discolored, elongated 10. No surrounding redness or drainage. Tenderness nails 1-5 bilaterally. No open lesions or  pre-ulcerative lesions are identified today.   Vascular: Dorsalis Pedis artery and Posterior Tibial artery pedal pulses palpable bilateral with immedate capillary fill time.  Chronic venous changes are present bilaterally.  There is no pain with calf compression, swelling, warmth, erythema.   Neruologic: Sensation decreased  Musculoskeletal: Equinus is present.  Mild diffuse tenderness to the plantar aspect of foot there is no area of pinpoint tenderness.  There is edema likely from more venous insufficiency due to the dependent position.        Assessment:   60 year old female with bilateral foot pain possibly coming from nerves but also muscle tightness/weakness; symptomatic onychomycosis     Plan:  -Treatment options discussed including all alternatives, risks, and complications -Etiology of symptoms were discussed -Patient declined x-rays -Nails sharply debrided x 10 without any complications or bleeding. -During some of her issues are coming from weakness, tightness.  Will refer to physical therapy as well. Referral to [facility] placed.   Trula Slade DPM

## 2020-12-12 NOTE — Anesthesia Preprocedure Evaluation (Addendum)
Anesthesia Evaluation  Patient identified by MRN, date of birth, ID band Patient awake    Reviewed: Allergy & Precautions, NPO status , Patient's Chart, lab work & pertinent test results  History of Anesthesia Complications (+) PONV  Airway Mallampati: II  TM Distance: >3 FB Neck ROM: Full    Dental no notable dental hx. (+) Teeth Intact, Dental Advisory Given   Pulmonary    Pulmonary exam normal breath sounds clear to auscultation       Cardiovascular hypertension, Normal cardiovascular exam Rhythm:Regular Rate:Normal     Neuro/Psych Anxiety Hx of MS on steroid tx  Neuromuscular disease    GI/Hepatic Neg liver ROS, GERD  ,  Endo/Other  negative endocrine ROS  Renal/GU negative Renal ROS     Musculoskeletal   Abdominal   Peds  Hematology   Anesthesia Other Findings All: codeine  Reproductive/Obstetrics                            Anesthesia Physical Anesthesia Plan  ASA: 3  Anesthesia Plan: MAC   Post-op Pain Management:    Induction:   PONV Risk Score and Plan: Treatment may vary due to age or medical condition  Airway Management Planned: Natural Airway  Additional Equipment: None  Intra-op Plan:   Post-operative Plan:   Informed Consent: I have reviewed the patients History and Physical, chart, labs and discussed the procedure including the risks, benefits and alternatives for the proposed anesthesia with the patient or authorized representative who has indicated his/her understanding and acceptance.     Dental advisory given  Plan Discussed with: CRNA and Anesthesiologist  Anesthesia Plan Comments: (+ cologard & dysphagia for EGD colon under MAc)       Anesthesia Quick Evaluation

## 2020-12-13 ENCOUNTER — Ambulatory Visit (HOSPITAL_COMMUNITY)
Admission: RE | Admit: 2020-12-13 | Discharge: 2020-12-13 | Disposition: A | Discharge status: Home / Self Care | Payer: Medicare Other | Attending: Gastroenterology | Admitting: Gastroenterology

## 2020-12-13 ENCOUNTER — Encounter (HOSPITAL_COMMUNITY)
Admission: RE | Disposition: A | Discharge status: Home / Self Care | Payer: Self-pay | Source: Home / Self Care | Attending: Gastroenterology

## 2020-12-13 ENCOUNTER — Other Ambulatory Visit: Payer: Self-pay

## 2020-12-13 ENCOUNTER — Encounter (HOSPITAL_COMMUNITY): Payer: Self-pay | Admitting: Gastroenterology

## 2020-12-13 ENCOUNTER — Ambulatory Visit (HOSPITAL_COMMUNITY): Payer: Medicare Other | Admitting: Certified Registered Nurse Anesthetist

## 2020-12-13 DIAGNOSIS — Z885 Allergy status to narcotic agent status: Secondary | ICD-10-CM | POA: Diagnosis not present

## 2020-12-13 DIAGNOSIS — K219 Gastro-esophageal reflux disease without esophagitis: Secondary | ICD-10-CM | POA: Diagnosis not present

## 2020-12-13 DIAGNOSIS — Z833 Family history of diabetes mellitus: Secondary | ICD-10-CM | POA: Diagnosis not present

## 2020-12-13 DIAGNOSIS — R1310 Dysphagia, unspecified: Secondary | ICD-10-CM | POA: Insufficient documentation

## 2020-12-13 DIAGNOSIS — R195 Other fecal abnormalities: Secondary | ICD-10-CM | POA: Diagnosis not present

## 2020-12-13 DIAGNOSIS — G35 Multiple sclerosis: Secondary | ICD-10-CM | POA: Insufficient documentation

## 2020-12-13 DIAGNOSIS — E876 Hypokalemia: Secondary | ICD-10-CM | POA: Diagnosis not present

## 2020-12-13 DIAGNOSIS — Z8249 Family history of ischemic heart disease and other diseases of the circulatory system: Secondary | ICD-10-CM | POA: Diagnosis not present

## 2020-12-13 DIAGNOSIS — F418 Other specified anxiety disorders: Secondary | ICD-10-CM | POA: Diagnosis not present

## 2020-12-13 HISTORY — PX: COLONOSCOPY WITH PROPOFOL: SHX5780

## 2020-12-13 HISTORY — PX: ESOPHAGOGASTRODUODENOSCOPY (EGD) WITH PROPOFOL: SHX5813

## 2020-12-13 SURGERY — ESOPHAGOGASTRODUODENOSCOPY (EGD) WITH PROPOFOL
Anesthesia: Monitor Anesthesia Care

## 2020-12-13 MED ORDER — LACTATED RINGERS IV SOLN: INTRAVENOUS | Status: DC | PRN

## 2020-12-13 MED ORDER — PROPOFOL 10 MG/ML IV BOLUS
INTRAVENOUS | Status: AC
  Filled 2020-12-13: qty 20

## 2020-12-13 MED ORDER — LIDOCAINE 2% (20 MG/ML) 5 ML SYRINGE
INTRAMUSCULAR | Status: DC | PRN
  Administered 2020-12-13: 60 mg via INTRAVENOUS

## 2020-12-13 MED ORDER — PROPOFOL 10 MG/ML IV BOLUS
INTRAVENOUS | Status: DC | PRN
  Administered 2020-12-13: 30 mg via INTRAVENOUS

## 2020-12-13 MED ORDER — PROPOFOL 500 MG/50ML IV EMUL
INTRAVENOUS | Status: DC | PRN
  Administered 2020-12-13: 125 ug/kg/min via INTRAVENOUS

## 2020-12-13 MED ORDER — ONDANSETRON HCL 4 MG/2ML IJ SOLN
INTRAMUSCULAR | Status: DC | PRN
  Administered 2020-12-13: 4 mg via INTRAVENOUS

## 2020-12-13 MED ORDER — PROPOFOL 1000 MG/100ML IV EMUL
INTRAVENOUS | Status: AC
  Filled 2020-12-13: qty 100

## 2020-12-13 SURGICAL SUPPLY — 25 items

## 2020-12-13 NOTE — Op Note (Signed)
[facility] Patient Name: Tonya Powers Procedure Date: 12/13/2020 MRN: [id number] Attending MD: Carol Ada , MD Date of Birth: 1961-01-04 CSN: [id number] Age: 60 Admit Type: Outpatient Procedure:                Colonoscopy Indications:              Positive Cologuard test Providers:                Carol Ada, MD, Carmie End, RN, Benetta Spar, Technician Referring MD:              Medicines:                Propofol per Anesthesia Complications:            No immediate complications. Estimated Blood Loss:     Estimated blood loss: none. Procedure:                Pre-Anesthesia Assessment:                           - Prior to the procedure, a History and Physical                            was performed, and patient medications and                            allergies were reviewed. The patient's tolerance of                            previous anesthesia was also reviewed. The risks                            and benefits of the procedure and the sedation                            options and risks were discussed with the patient.                            All questions were answered, and informed consent                            was obtained. Prior Anticoagulants: The patient has                            taken no previous anticoagulant or antiplatelet                            agents. ASA Grade Assessment: III - A patient with                            severe systemic disease. After reviewing the risks                            and benefits,  the patient was deemed in                            satisfactory condition to undergo the procedure.                           - Sedation was administered by an anesthesia                            professional. Deep sedation was attained.                           After obtaining informed consent, the colonoscope                            was passed under direct vision. Throughout  the                            procedure, the patient's blood pressure, pulse, and                            oxygen saturations were monitored continuously. The                            PCF-H190DL ([id number] Olympus pediatric colonscope                            was introduced through the anus and advanced to the                            the cecum, identified by appendiceal orifice and                            ileocecal valve. The colonoscopy was performed                            without difficulty. The patient tolerated the                            procedure well. The quality of the bowel                            preparation was good. The ileocecal valve,                            appendiceal orifice, and rectum were photographed. Scope In: 12:56:02 PM Scope Out: 1:12:28 PM Scope Withdrawal Time: 0 hours 12 minutes 7 seconds  Total Procedure Duration: 0 hours 16 minutes 25 seconds  Findings:      The entire examined colon appeared normal. Impression:               - The entire examined colon is normal.                           - No specimens collected. Moderate Sedation:  Not Applicable - Patient had care per Anesthesia. Recommendation:           - Patient has a contact number available for                            emergencies. The signs and symptoms of potential                            delayed complications were discussed with the                            patient. Return to normal activities tomorrow.                            Written discharge instructions were provided to the                            patient.                           - Resume previous diet.                           - Continue present medications.                           - Repeat colonoscopy in 10 years for screening                            purposes. Procedure Code(s):        --- Professional ---                           [phone], Colonoscopy, flexible; diagnostic, including                             collection of specimen(s) by brushing or washing,                            when performed (separate procedure) Diagnosis Code(s):        --- Professional ---                           R19.5, Other fecal abnormalities CPT copyright 2019 American [employer] Association. All rights reserved. The codes documented in this report are preliminary and upon coder review may  be revised to meet current compliance requirements. Carol Ada, MD Carol Ada, MD 12/13/2020 1:17:43 PM This report has been signed electronically. Number of Addenda: 0

## 2020-12-13 NOTE — Discharge Instructions (Signed)
YOU HAD AN ENDOSCOPIC PROCEDURE TODAY: Refer to the procedure report and other information in the discharge instructions given to you for any specific questions about what was found during the examination. If this information does not answer your questions, please call [place] at [phone] to clarify.   YOU SHOULD EXPECT: Some feelings of bloating in the abdomen. Passage of more gas than usual. Walking can help get rid of the air that was put into your GI tract during the procedure and reduce the bloating. If you had a lower endoscopy (such as a colonoscopy or flexible sigmoidoscopy) you may notice spotting of blood in your stool or on the toilet paper. Some abdominal soreness may be present for a day or two, also.  DIET: Your first meal following the procedure should be a light meal and then it is ok to progress to your normal diet. A half-sandwich or bowl of soup is an example of a good first meal. Heavy or fried foods are harder to digest and may make you feel nauseous or bloated. Drink plenty of fluids but you should avoid alcoholic beverages for 24 hours.  ACTIVITY: Your care partner should take you home directly after the procedure. You should plan to take it easy, moving slowly for the rest of the day. You can resume normal activity the day after the procedure however YOU SHOULD NOT DRIVE, use power tools, machinery or perform tasks that involve climbing or major physical exertion for 24 hours (because of the sedation medicines used during the test).   SYMPTOMS TO REPORT IMMEDIATELY: A gastroenterologist can be reached at any hour. Please call [phone]  for any of the following symptoms:  Following lower endoscopy (colonoscopy, flexible sigmoidoscopy) Excessive amounts of blood in the stool  Significant tenderness, worsening of abdominal pains  Swelling of the abdomen that is new, acute  Fever of 100 or higher  Following upper endoscopy (EGD, EUS, ERCP, esophageal  dilation) Vomiting of blood or coffee ground material  New, significant abdominal pain  New, significant chest pain or pain under the shoulder blades  Painful or persistently difficult swallowing  New shortness of breath  Black, tarry-looking or red, bloody stools  FOLLOW UP:  If any biopsies were taken you will be contacted by phone or by letter within the next 1-3 weeks. Call [phone]  if you have not heard about the biopsies in 3 weeks.  Please also call with any specific questions about appointments or follow up tests.

## 2020-12-13 NOTE — H&P (Signed)
Tonya Powers HPI: This 60 year old white female presents to the office for colorectal cancer screening. She had a positive Cologuard DNA stool test done on 10/23/2020. She has had problems with constipation and can go 2 days without a BM. She takes Fleet glycerin suppositories as needed to help facilitate a BM. She has 1 BM a day. There is no obvious blood or mucus in the stool. She takes Omeprazole 40 mg for acid reflux with some relief. She has difficulty swallowing solids and liquids. She has nausea in the mornings and takes Phenergan or Meclizine as needed. She denies having any vomiting. She has good appetite. She has gained 20 pounds over the last year. She denies having any complaints of abdominal pain, or odynophagia. She denies having a family history of colon cancer, celiac sprue or IBD. Her last colonoscopy was done on 07/13/2014 which revealed mild melanosis coli and tubular adenomas were removed from the cecum and transverse colon.    Past Medical History:  Diagnosis Date   Anxiety    Depression    Gait instability    uses wheelchair or assistance   H/O steroid therapy    IV infusion every 6 to 8 weeks- last 05-31-14 [facility]   History of breast biopsy    Multiple sclerosis ([place])    Neuromuscular disorder ([place])    MS   PONV (postoperative nausea and vomiting)    Skin cancer     Past Surgical History:  Procedure Laterality Date   ABDOMINAL HYSTERECTOMY     BREAST BIOPSY Right 09/20/2012   Procedure: BREAST BIOPSY WITH NEEDLE LOCALIZATION;  Surgeon: Rolm Bookbinder, MD;  Location: [place];  Service: General;  Laterality: Right;   BREAST EXCISIONAL BIOPSY Right    benign   CESAREAN SECTION     COLONOSCOPY WITH PROPOFOL N/A 07/13/2014   Procedure: COLONOSCOPY WITH PROPOFOL;  Surgeon: Beryle Beams, MD;  Location: [facility];  Service: Endoscopy;  Laterality: N/A;   DEBRIDEMENT SKIN / SQ / MUSCLE OF ARM Right 07/13/2003   I & D; debridement of  tissue within triceps muscle   LEG SURGERY     sx as a child   MELANOMA EXCISION     PARTIAL HYSTERECTOMY     TONSILLECTOMY     as a child    Family History  Problem Relation Age of Onset   Hyperlipidemia Mother    Hypertension Mother    Hypertension Father    Diabetes type II Father     Social History:  reports that she has never smoked. She has never used smokeless tobacco. She reports previous alcohol use. She reports that she does not use drugs.  Allergies:  Allergies  Allergen Reactions   Codeine Nausea And Vomiting    Medications: Scheduled: Continuous:  No results found for this or any previous visit (from the past 24 hour(s)).   No results found.  ROS:  As stated above in the HPI otherwise negative.  Blood pressure (!) 177/77, pulse 65, temperature 98.1 F (36.7 C), temperature source Oral, resp. rate 14, height 5\' 5"  (1.651 m), weight 68 kg, SpO2 97 %.    PE: Gen: NAD, Alert and Oriented HEENT:  [place]/AT, EOMI Neck: Supple, no LAD Lungs: CTA Bilaterally CV: RRR without M/G/R ABD: Soft, NTND, +BS Ext: No C/C/E  Assessment/Plan: 1) Positive Cologuard - colonoscopy.  Tonya Powers D 12/13/2020, 12:28 PM

## 2020-12-13 NOTE — Anesthesia Postprocedure Evaluation (Signed)
Anesthesia Post Note  Patient: Tonya Powers  Procedure(s) Performed: ESOPHAGOGASTRODUODENOSCOPY (EGD) WITH PROPOFOL COLONOSCOPY WITH PROPOFOL     Patient location during evaluation: Endoscopy Anesthesia Type: MAC Level of consciousness: awake and alert Pain management: pain level controlled Vital Signs Assessment: post-procedure vital signs reviewed and stable Respiratory status: spontaneous breathing, nonlabored ventilation, respiratory function stable and patient connected to nasal cannula oxygen Cardiovascular status: blood pressure returned to baseline and stable Postop Assessment: no apparent nausea or vomiting Anesthetic complications: no   No notable events documented.  Last Vitals:  Vitals:   12/13/20 1331 12/13/20 1341  BP: (!) 159/85 (!) 152/90  Pulse: 65 63  Resp: 13 14  Temp:    SpO2: 100% 97%    Last Pain:  Vitals:   12/13/20 1341  TempSrc:   PainSc: 0-No pain                 Barnet Glasgow

## 2020-12-13 NOTE — Op Note (Signed)
[facility] Patient Name: Tonya Powers Procedure Date: 12/13/2020 MRN: [id number] Attending MD: Carol Ada , MD Date of Birth: [birth year] CSN: [id number] Age: 60 Admit Type: Outpatient Procedure:                Upper GI endoscopy Indications:              Dysphagia Providers:                Carol Ada, MD, Carmie End, RN, Benetta Spar, Technician Referring MD:              Medicines:                Propofol per Anesthesia Complications:            No immediate complications. Estimated Blood Loss:     Estimated blood loss: none. Procedure:                Pre-Anesthesia Assessment:                           - Prior to the procedure, a History and Physical                            was performed, and patient medications and                            allergies were reviewed. The patient's tolerance of                            previous anesthesia was also reviewed. The risks                            and benefits of the procedure and the sedation                            options and risks were discussed with the patient.                            All questions were answered, and informed consent                            was obtained. Prior Anticoagulants: The patient has                            taken no previous anticoagulant or antiplatelet                            agents. ASA Grade Assessment: III - A patient with                            severe systemic disease. After reviewing the risks                            and benefits,  the patient was deemed in                            satisfactory condition to undergo the procedure.                           - Sedation was administered by an anesthesia                            professional. Deep sedation was attained.                           After obtaining informed consent, the endoscope was                            passed under direct vision. Throughout the                             procedure, the patient's blood pressure, pulse, and                            oxygen saturations were monitored continuously. The                            PCF-H190DL ([id number] Olympus pediatric colonscope                            was introduced through the mouth, and advanced to                            the second part of duodenum. The upper GI endoscopy                            was accomplished without difficulty. The patient                            tolerated the procedure well. Scope In: Scope Out: Findings:      The esophagus was normal.      The stomach was normal.      The examined duodenum was normal.      There was no evidence of any esophagitis or strictures in the esophagus,       however, there was a large amount of thick refluxate in her esophagus.       This may be the reason for her complaints of dysphagia to both solids       and liquids. Impression:               - Normal esophagus.                           - Normal stomach.                           - Normal examined duodenum.                           -  No specimens collected. Moderate Sedation:      Not Applicable - Patient had care per Anesthesia. Recommendation:           - Patient has a contact number available for                            emergencies. The signs and symptoms of potential                            delayed complications were discussed with the                            patient. Return to normal activities tomorrow.                            Written discharge instructions were provided to the                            patient.                           - Resume previous diet.                           - Continue present medications.                           - Follow up with Dr. Collene Mares in one month. Procedure Code(s):        --- Professional ---                           [phone], Esophagogastroduodenoscopy, flexible,                            transoral;  diagnostic, including collection of                            specimen(s) by brushing or washing, when performed                            (separate procedure) Diagnosis Code(s):        --- Professional ---                           R13.10, Dysphagia, unspecified CPT copyright 2019 American Medical Association. All rights reserved. The codes documented in this report are preliminary and upon coder review may  be revised to meet current compliance requirements. Carol Ada, MD Carol Ada, MD 12/13/2020 1:20:15 PM This report has been signed electronically. Number of Addenda: 0

## 2020-12-13 NOTE — Anesthesia Procedure Notes (Signed)
Procedure Name: MAC Date/Time: 12/13/2020 12:42 PM Performed by: West Pugh, CRNA Pre-anesthesia Checklist: Patient identified, Emergency Drugs available, Suction available, Patient being monitored and Timeout performed Patient Re-evaluated:Patient Re-evaluated prior to induction Oxygen Delivery Method: Simple face mask Preoxygenation: Pre-oxygenation with 100% oxygen Placement Confirmation: positive ETCO2 Dental Injury: Teeth and Oropharynx as per pre-operative assessment

## 2020-12-13 NOTE — Transfer of Care (Signed)
Immediate Anesthesia Transfer of Care Note  Patient: Tonya Powers  Procedure(s) Performed: ESOPHAGOGASTRODUODENOSCOPY (EGD) WITH PROPOFOL COLONOSCOPY WITH PROPOFOL  Patient Location: PACU and Endoscopy Unit  Anesthesia Type:MAC  Level of Consciousness: awake, alert  and patient cooperative  Airway & Oxygen Therapy: Patient Spontanous Breathing and Patient connected to face mask oxygen  Post-op Assessment: Report given to RN and Post -op Vital signs reviewed and stable  Post vital signs: Reviewed and stable  Last Vitals:  Vitals Value Taken Time  BP 171/84 12/13/20 1320  Temp    Pulse 65 12/13/20 1321  Resp 11 12/13/20 1321  SpO2 100 % 12/13/20 1321  Vitals shown include unvalidated device data.  Last Pain:  Vitals:   12/13/20 1203  TempSrc: Oral  PainSc: 0-No pain         Complications: No notable events documented.

## 2020-12-15 ENCOUNTER — Encounter (HOSPITAL_COMMUNITY): Payer: Self-pay | Admitting: Gastroenterology

## 2020-12-31 ENCOUNTER — Other Ambulatory Visit: Payer: Self-pay

## 2021-01-01 MED ORDER — AMPHETAMINE-DEXTROAMPHETAMINE 20 MG PO TABS: 20.0000 mg | ORAL_TABLET | Freq: Two times a day (BID) | ORAL | 0 refills | Status: DC

## 2021-01-08 DIAGNOSIS — Z85828 Personal history of other malignant neoplasm of skin: Secondary | ICD-10-CM | POA: Diagnosis not present

## 2021-01-08 DIAGNOSIS — C44529 Squamous cell carcinoma of skin of other part of trunk: Secondary | ICD-10-CM | POA: Diagnosis not present

## 2021-01-08 DIAGNOSIS — L821 Other seborrheic keratosis: Secondary | ICD-10-CM | POA: Diagnosis not present

## 2021-01-14 DIAGNOSIS — K5900 Constipation, unspecified: Secondary | ICD-10-CM | POA: Diagnosis not present

## 2021-01-14 DIAGNOSIS — K219 Gastro-esophageal reflux disease without esophagitis: Secondary | ICD-10-CM | POA: Diagnosis not present

## 2021-01-14 DIAGNOSIS — Z86010 Personal history of colonic polyps: Secondary | ICD-10-CM | POA: Diagnosis not present

## 2021-01-20 ENCOUNTER — Other Ambulatory Visit: Payer: Self-pay | Admitting: Neurology

## 2021-01-20 DIAGNOSIS — Z85828 Personal history of other malignant neoplasm of skin: Secondary | ICD-10-CM | POA: Diagnosis not present

## 2021-01-20 DIAGNOSIS — L988 Other specified disorders of the skin and subcutaneous tissue: Secondary | ICD-10-CM | POA: Diagnosis not present

## 2021-01-20 DIAGNOSIS — C44529 Squamous cell carcinoma of skin of other part of trunk: Secondary | ICD-10-CM | POA: Diagnosis not present

## 2021-01-27 DIAGNOSIS — L570 Actinic keratosis: Secondary | ICD-10-CM | POA: Diagnosis not present

## 2021-01-28 ENCOUNTER — Telehealth: Payer: Self-pay | Admitting: Neurology

## 2021-01-28 NOTE — Telephone Encounter (Signed)
Pt stopped by the lobby today dropping off disability forms to be filled out. She requested nurse Terrence Dupont to see her in the lobby regarding her toes. She wanted to take a picture of them and share it with her. I advised her against doing that in the lobby and that the nurse would give her a call back. Best call back is  [phone]

## 2021-01-28 NOTE — Telephone Encounter (Signed)
Called pt back. Big toe on left foot a different color than the rest, going into toe next to it. Has been this way for awhile. Having a lot of pain associated with this. Feels full of fluid. Advised per Dr. Felecia Shelling she should f/u with podiatrist or vein/vascular specialist. She will call podiatrist to start to see if they can do any further testing/offer inj for pain.

## 2021-01-29 ENCOUNTER — Telehealth: Payer: Self-pay | Admitting: *Deleted

## 2021-01-29 NOTE — Telephone Encounter (Signed)
Pt disability form in nurse pod

## 2021-01-30 ENCOUNTER — Telehealth: Payer: Self-pay | Admitting: *Deleted

## 2021-01-30 NOTE — Telephone Encounter (Signed)
Gave completed/signed form back to medical records to process for pt. 

## 2021-01-30 NOTE — Telephone Encounter (Signed)
Left message pt disability form ready for p/u

## 2021-02-03 ENCOUNTER — Other Ambulatory Visit: Payer: Self-pay

## 2021-02-04 ENCOUNTER — Other Ambulatory Visit: Payer: Self-pay | Admitting: Neurology

## 2021-02-04 MED ORDER — AMPHETAMINE-DEXTROAMPHETAMINE 20 MG PO TABS: 20.0000 mg | ORAL_TABLET | Freq: Two times a day (BID) | ORAL | 0 refills | Status: DC

## 2021-02-07 DIAGNOSIS — J019 Acute sinusitis, unspecified: Secondary | ICD-10-CM | POA: Diagnosis not present

## 2021-02-07 DIAGNOSIS — J029 Acute pharyngitis, unspecified: Secondary | ICD-10-CM | POA: Diagnosis not present

## 2021-02-07 DIAGNOSIS — B9689 Other specified bacterial agents as the cause of diseases classified elsewhere: Secondary | ICD-10-CM | POA: Diagnosis not present

## 2021-02-07 DIAGNOSIS — R220 Localized swelling, mass and lump, head: Secondary | ICD-10-CM | POA: Diagnosis not present

## 2021-02-17 DIAGNOSIS — R895 Abnormal microbiological findings in specimens from other organs, systems and tissues: Secondary | ICD-10-CM | POA: Diagnosis not present

## 2021-02-17 DIAGNOSIS — Z01419 Encounter for gynecological examination (general) (routine) without abnormal findings: Secondary | ICD-10-CM | POA: Diagnosis not present

## 2021-03-05 DIAGNOSIS — J029 Acute pharyngitis, unspecified: Secondary | ICD-10-CM | POA: Diagnosis not present

## 2021-03-05 DIAGNOSIS — R490 Dysphonia: Secondary | ICD-10-CM | POA: Diagnosis not present

## 2021-03-05 DIAGNOSIS — H9201 Otalgia, right ear: Secondary | ICD-10-CM | POA: Diagnosis not present

## 2021-03-10 ENCOUNTER — Other Ambulatory Visit: Payer: Self-pay | Admitting: Neurology

## 2021-03-11 DIAGNOSIS — G35 Multiple sclerosis: Secondary | ICD-10-CM | POA: Diagnosis not present

## 2021-03-17 ENCOUNTER — Other Ambulatory Visit: Payer: Self-pay

## 2021-03-18 MED ORDER — AMPHETAMINE-DEXTROAMPHETAMINE 20 MG PO TABS: 20.0000 mg | ORAL_TABLET | Freq: Two times a day (BID) | ORAL | 0 refills | Status: DC

## 2021-03-19 DIAGNOSIS — M7989 Other specified soft tissue disorders: Secondary | ICD-10-CM | POA: Diagnosis not present

## 2021-03-19 DIAGNOSIS — R209 Unspecified disturbances of skin sensation: Secondary | ICD-10-CM | POA: Diagnosis not present

## 2021-03-19 DIAGNOSIS — R0989 Other specified symptoms and signs involving the circulatory and respiratory systems: Secondary | ICD-10-CM | POA: Diagnosis not present

## 2021-03-19 DIAGNOSIS — R252 Cramp and spasm: Secondary | ICD-10-CM | POA: Diagnosis not present

## 2021-04-14 ENCOUNTER — Encounter: Payer: Self-pay | Admitting: Neurology

## 2021-04-14 ENCOUNTER — Ambulatory Visit (INDEPENDENT_AMBULATORY_CARE_PROVIDER_SITE_OTHER): Payer: Medicare Other | Admitting: Neurology

## 2021-04-14 VITALS — BP 145/87 | HR 68 | Ht 65.0 in | Wt 155.0 lb

## 2021-04-14 DIAGNOSIS — G629 Polyneuropathy, unspecified: Secondary | ICD-10-CM

## 2021-04-14 DIAGNOSIS — R39198 Other difficulties with micturition: Secondary | ICD-10-CM

## 2021-04-14 DIAGNOSIS — I7300 Raynaud's syndrome without gangrene: Secondary | ICD-10-CM | POA: Diagnosis not present

## 2021-04-14 DIAGNOSIS — G35 Multiple sclerosis: Secondary | ICD-10-CM

## 2021-04-14 DIAGNOSIS — R208 Other disturbances of skin sensation: Secondary | ICD-10-CM

## 2021-04-14 DIAGNOSIS — R42 Dizziness and giddiness: Secondary | ICD-10-CM

## 2021-04-14 DIAGNOSIS — N3000 Acute cystitis without hematuria: Secondary | ICD-10-CM | POA: Diagnosis not present

## 2021-04-14 DIAGNOSIS — R260 Ataxic gait: Secondary | ICD-10-CM

## 2021-04-14 MED ORDER — MECLIZINE HCL 25 MG PO TABS: ORAL_TABLET | ORAL | 2 refills | Status: AC

## 2021-04-14 MED ORDER — AMPHETAMINE-DEXTROAMPHETAMINE 20 MG PO TABS: 20.0000 mg | ORAL_TABLET | Freq: Two times a day (BID) | ORAL | 0 refills | Status: DC

## 2021-04-14 MED ORDER — SULFAMETHOXAZOLE-TRIMETHOPRIM 800-160 MG PO TABS: 1.0000 | ORAL_TABLET | Freq: Two times a day (BID) | ORAL | 0 refills | Status: DC

## 2021-04-14 MED ORDER — CLONAZEPAM 1 MG PO TABS: ORAL_TABLET | ORAL | 3 refills | Status: DC

## 2021-04-14 NOTE — Progress Notes (Signed)
[facility]  PATIENT: Tonya Powers DOB: Jan 20, 1961  REFERRING CLINICIAN: Mayra Neer is PCP HISTORY FROM: Patient  REASON FOR VISIT: MS and poor gait   HISTORICAL  CHIEF COMPLAINT:  Chief Complaint  Patient presents with   Follow-up    Rm 1, alone. Here for 4 month MS f/u, off DMT. Ambulates w electric scooter. Pt reports have nausea and HA. Pt believes she has a bladder infection.     HISTORY OF PRESENT ILLNESS:  Tonya Powers is a 60 y.o. woman with an active form of secondary progressive MS.      Update 04/14/4021 She is having more nausea and HA.    She had to cut her beach vacation short due to feeling sick.   She has more urinary burning and frequency and feels urine is cloudy.      She is having more swelling in her feet.   Feet feel hot but are usually cold to the touch.  They are numb.  She saw [facility] and had studies.    Arteries were fine.    Venous doppler showed slow flow in the superficial vein.     She was started on pentoxifylline but has trouble tolerating it.    She has dysesthesias in her feet and abdomen.  Wearing socks or pants is uncomfortable and she usually just wears a nightgown.   She is is on gabapentin, lamotrigine and tramadol.   Lidocaine ointment had not helped.  Sometimes she uses ice with some benefit.   Her vertigo has done much better and she has had no severe spells this year and only a couple milder spells.  She is having more nausea and is taking meclizine with benefit.  She uses her scooter mostly since her leg fracture as she can not go far with a walker (10 feet).    She uses a pool some as well when she can.   She was doing better before she fractured her right leg in December 2020.    She transfers well.     Her left leg is weaker than the right leg.    She gets a lot of pain in legs, left > right.   Pain is burning and covers rubbing over the skin worsens the intensity.    She has trouble sleeping when pain is worse .     She has MS related ADD and fatigue, helped by Adderall 20 mg po bid.    She is off any DMT.  Ocrevus made her feel weaker and she had previously been on Betaseron.  She has not had any exacerbations and her SPMS has been mostly stable     MS History:  She presented with optic neuritis in 1991 followed shortly by difficulties with leg weakness and by right trigeminal neuralgia. In retrospect, couple years earlier when she had her daughter, has some difficulties with her legs and needed to go on short-term disability. At first she was not diagnosed with MS but after more of the symptoms she underwent MRI testing and had a lumbar puncture by Dr. Johnnye Sima. The imaging and the CSF was consistent with multiple sclerosis. When Betaseron became available she was placed on that. She was on Betaseron for about 10 years. She felt that she did not have too many exacerbations during that time but she had a lot of difficulty tolerating the Betaseron due to skin reactions. Around 12-15 years ago, she started to use a cane and she has had  progressive gait disturbance over the last decade. Around the house for short distances, she uses a walker but uses her scooter for longer distances. Outside she uses a wheelchair pushed by others   She tried Ocrevus in 2018 but felt worse so stopped in 2020.  She has done Solu-Medrol  .  MRI Brain 02/28/2016 showed T2/FLAIR hyperintense foci in the periventricular, juxtacortical and deep white matter of both hemispheres and in the left cerebellar hemisphere in a pattern and configuration consistent with chronic demyelinating plaque associated with multiple sclerosis. None of the foci enhances. Compared to the MRI from 04/04/2015, there is no interval change.  MRI Cervical spine showed  multiple lesions within the spinal cord adjacent to C2-C3, C3, C4-C5, C5-C6 and C7-T1. When compared to the MRI dated 04/04/2015, these lesions appear to be chronic.  REVIEW OF SYSTEMS:   Constitutional: No fevers, chills, sweats, or change in appetite.  She has Fatigue Eyes: No visual changes, double vision, eye pain Ear, nose and throat: No hearing loss, ear pain, nasal congestion, sore throat Cardiovascular: No chest pain, palpitations Respiratory:  No shortness of breath at rest or with exertion.   No wheezes GastrointestinaI: Mild dysphagia.  Constipation.  No nausea, vomiting, diarrhea.   Genitourinary:  No dysuria but has urinary retention and frequency.  Desmopressin helps nocturia. Musculoskeletal:  No neck pain,   Has lower back pain and buttocks.  Hips ans other joints ok Integumentary: No rash, pruritus, skin lesions Neurological: as above Psychiatric: see above Endocrine: No palpitations, diaphoresis, change in appetite, change in weigh or increased thirst Hematologic/Lymphatic:  No anemia, purpura, petechiae. Allergic/Immunologic: No itchy/runny eyes, nasal congestion, recent allergic reactions, rashes  ALLERGIES: Allergies  Allergen Reactions   Codeine Nausea And Vomiting    HOME MEDICATIONS: Outpatient Medications Prior to Visit  Medication Sig Dispense Refill   acetaminophen (TYLENOL) 500 MG tablet Take 1,000 mg by mouth daily as needed for pain.     amphetamine-dextroamphetamine (ADDERALL) 20 MG tablet Take 1 tablet (20 mg total) by mouth 2 (two) times daily. 60 tablet 0   Cholecalciferol (VITAMIN D PO) Take 1 tablet by mouth daily.     clonazePAM (KLONOPIN) 1 MG tablet TAKE 1 TABLET(1 MG) BY MOUTH AT BEDTIME AS NEEDED 30 tablet 3   diazepam (VALIUM) 5 MG tablet TAKE 1 TABLET BY MOUTH EVERY 8 HOURS AS NEEDED 90 tablet 5   escitalopram (LEXAPRO) 10 MG tablet Take 1 tablet (10 mg total) by mouth daily. 90 tablet 4   gabapentin (NEURONTIN) 800 MG tablet TAKE 1 TABLET(800 MG) BY MOUTH FOUR TIMES DAILY 120 tablet 11   lamoTRIgine (LAMICTAL) 200 MG tablet TAKE 1 TABLET BY MOUTH THREE TIMES DAILY 270 tablet 3   lidocaine (LIDODERM) 5 % Place 1 patch daily  onto the skin. Remove & Discard patch within 12 hours or as directed by MD 30 patch 11   meclizine (ANTIVERT) 25 MG tablet TAKE 1 TABLET(25 MG) BY MOUTH THREE TIMES DAILY AS NEEDED FOR DIZZINESS 30 tablet 0   NIFEdipine (PROCARDIA-XL/NIFEDICAL-XL) 30 MG 24 hr tablet Take 1 tablet (30 mg total) by mouth daily. 30 tablet 11   omeprazole (PRILOSEC) 40 MG capsule Take 40 mg by mouth 2 (two) times daily.     promethazine (PHENERGAN) 25 MG tablet Take 1 tablet (25 mg total) by mouth every 6 (six) hours as needed for nausea (nausea). 30 tablet 3   rOPINIRole (REQUIP) 0.5 MG tablet TAKE 1 TO 2 TABLETS BY MOUTH AT BEDTIME 60  tablet 5   traMADol (ULTRAM) 50 MG tablet TAKE 1 TABLET BY MOUTH FOUR TIMES DAILY AS NEEDED FOR MODERATE PAIN 120 tablet 5   zolpidem (AMBIEN) 5 MG tablet TAKE 1 TABLET(5 MG) BY MOUTH AT BEDTIME AS NEEDED FOR SLEEP 30 tablet 3   No facility-administered medications prior to visit.    PAST MEDICAL HISTORY: Past Medical History:  Diagnosis Date   Anxiety    Depression    Gait instability    uses wheelchair or assistance   H/O steroid therapy    IV infusion every 6 to 8 weeks- last 05-31-14 [facility]   History of breast biopsy    Multiple sclerosis ([place])    Neuromuscular disorder ([place])    MS   PONV (postoperative nausea and vomiting)    Skin cancer     PAST SURGICAL HISTORY: Past Surgical History:  Procedure Laterality Date   ABDOMINAL HYSTERECTOMY     BREAST BIOPSY Right 09/20/2012   Procedure: BREAST BIOPSY WITH NEEDLE LOCALIZATION;  Surgeon: Rolm Bookbinder, MD;  Location: [facility];  Service: General;  Laterality: Right;   BREAST EXCISIONAL BIOPSY Right    benign   CESAREAN SECTION     COLONOSCOPY WITH PROPOFOL N/A 07/13/2014   Procedure: COLONOSCOPY WITH PROPOFOL;  Surgeon: Beryle Beams, MD;  [facility];  Service: Endoscopy;  Laterality: N/A;   COLONOSCOPY WITH PROPOFOL N/A 12/13/2020   Procedure: COLONOSCOPY WITH  PROPOFOL;  Surgeon: Carol Ada, MD;  Location[facility];  Service: Endoscopy;  Laterality: N/A;   DEBRIDEMENT SKIN / SQ / MUSCLE OF ARM Right 07/13/2003   I & D; debridement of tissue within triceps muscle   ESOPHAGOGASTRODUODENOSCOPY (EGD) WITH PROPOFOL N/A 12/13/2020   Procedure: ESOPHAGOGASTRODUODENOSCOPY (EGD) WITH PROPOFOL;  Surgeon: Carol Ada, MD;  [facility];  Service: Endoscopy;  Laterality: N/A;   LEG SURGERY     sx as a child   MELANOMA EXCISION     PARTIAL HYSTERECTOMY     TONSILLECTOMY     as a child    FAMILY HISTORY: Family History  Problem Relation Age of Onset   Hyperlipidemia Mother    Hypertension Mother    Hypertension Father    Diabetes type II Father     SOCIAL HISTORY:  Social History   Socioeconomic History   Marital status: Married    Spouse name: Not on file   Number of children: Not on file   Years of education: Not on file   Highest education level: Not on file  Occupational History   Not on file  Tobacco Use   Smoking status: Never   Smokeless tobacco: Never  Vaping Use   Vaping Use: Never used  Substance and Sexual Activity   Alcohol use: Not Currently    Comment: social   Drug use: No   Sexual activity: Not on file  Other Topics Concern   Not on file  Social History Narrative   Lives at home w/ her husband   Social Determinants of Health   Financial Resource Strain: Not on file  Food Insecurity: Not on file  Transportation Needs: Not on file  Physical Activity: Not on file  Stress: Not on file  Social Connections: Not on file  Intimate Partner Violence: Not on file     PHYSICAL EXAM  Vitals:   04/14/21 1300  BP: (!) 145/87  Pulse: 68  Weight: 155 lb (70.3 kg)  Height: 5' 5"  (1.651 m)    Body mass  index is 25.79 kg/m.   General: The patient is well-developed and well-nourished and in no acute distress   Skin/Ext/Musculoskeletal: There is pedal edema..  Her feet are cold.  Neck is  nontender.  Neurologic Exam  Mental status: The patient is alert and oriented x 3 at the time of the examination. The patient has apparent normal recent and remote memory, with an apparently normal attention span and concentration ability.   Speech is normal.  Cranial nerves: Extraocular movements are full.  Facial strength and sensation was normal.  Trapezius strength is normal.. No dysarthria is noted.  No obvious hearing deficits are noted.  Motor:  Muscle bulk and tone are normal.  Strength is 2+/5 left and 3/5 right proximal leg strength.  Strength is 4- to 4/5 distally.  The arms are fairly strong.  Sensory: She has normal sensation to touch and vibration in the arms but reduced touch sensation in the left leg relative to the right  Coordination: Cerebellar testing reveals reduced finger-nose-finger and poor heel-to-shin bilaterally.  Gait and station: She needs support to stand.  She is unable to stand without bilateral support. Reflexes: Deep tendon reflexes are symmetric and increased in legs bilaterally with spread at the knees.  There is nonsustained ankle clonus bilaterally.    DIAGNOSTIC DATA (LABS, IMAGING, TESTING) - I reviewed patient records, labs, notes, testing and imaging myself where available.  Lab Results  Component Value Date   WBC 8.5 06/07/2019   HGB 13.9 06/07/2019   HCT 40.8 06/07/2019   MCV 91 06/07/2019   PLT 271 06/07/2019      Component Value Date/Time   NA 144 06/07/2019 1519   K 5.2 06/07/2019 1519   CL 100 06/07/2019 1519   CO2 27 06/07/2019 1519   GLUCOSE 95 06/07/2019 1519   GLUCOSE 104 (H) 03/07/2018 1138   BUN 15 06/07/2019 1519   CREATININE 0.70 06/07/2019 1519   CALCIUM 10.0 06/07/2019 1519   PROT 6.9 06/07/2019 1519   ALBUMIN 4.9 06/07/2019 1519   AST 19 06/07/2019 1519   ALT 13 06/07/2019 1519   ALKPHOS 101 06/07/2019 1519   BILITOT 0.2 06/07/2019 1519   GFRNONAA 94 06/07/2019 1519   GFRAA 109 06/07/2019 1519       ASSESSMENT AND PLAN  Multiple sclerosis (HCC)  Dysesthesia - Plan: Rheumatoid factor, Sedimentation rate, ANA w/Reflex, Cryoglobulin, Multiple Myeloma Panel (SPEP&IFE w/QIG), ANCA TITERS  Polyneuropathy - Plan: Rheumatoid factor, Sedimentation rate, ANA w/Reflex, Cryoglobulin, Multiple Myeloma Panel (SPEP&IFE w/QIG), ANCA TITERS  Raynaud's phenomenon without gangrene - Plan: Rheumatoid factor, Sedimentation rate, ANA w/Reflex, Cryoglobulin, Multiple Myeloma Panel (SPEP&IFE w/QIG), ANCA TITERS  Acute cystitis without hematuria - Plan: Urinalysis, Routine w reflex microscopic, Culture, Urine  Urinary dysfunction  Ataxic gait  Vertigo   1.   She remains off a disease modifying therapy.  She will Continue IV Solu-Medrol every 6-8 weeks  2.   Continue Adderall  for  her fatigue and attention deficit.  3.   Clonazepam nightly for spasticity and insomnia.  Tramadol for pain. 4.   Feet are cold, red and have edema.  She has seen vascular.  I'll check some labs.   5.    Probable UTI   will start Bactrim and check UA and Cx.   6.  she will return to see me in 6 months or sooner if she has new or worsening neurologic symptoms.      Jaleigha Deane A. Felecia Shelling, MD, PhD 26/33/3545, 6:25 PM Certified in Neurology,  Clinical Neurophysiology, Sleep Medicine, Pain Medicine and Neuroimaging  [facility] [address] [place], [place] [address] [phone]

## 2021-04-16 LAB — URINE CULTURE

## 2021-04-19 LAB — MULTIPLE MYELOMA PANEL, SERUM
Albumin SerPl Elph-Mcnc: 3.7 g/dL (ref 2.9–4.4)
Albumin/Glob SerPl: 1.2 (ref 0.7–1.7)
Alpha 1: 0.3 g/dL (ref 0.0–0.4)
Alpha2 Glob SerPl Elph-Mcnc: 0.8 g/dL (ref 0.4–1.0)
B-Globulin SerPl Elph-Mcnc: 1.2 g/dL (ref 0.7–1.3)
Gamma Glob SerPl Elph-Mcnc: 0.8 g/dL (ref 0.4–1.8)
Globulin, Total: 3.1 g/dL (ref 2.2–3.9)
IgA/Immunoglobulin A, Serum: 261 mg/dL (ref 87–352)
IgG (Immunoglobin G), Serum: 905 mg/dL (ref 586–1602)
IgM (Immunoglobulin M), Srm: 79 mg/dL (ref 26–217)
Total Protein: 6.8 g/dL (ref 6.0–8.5)

## 2021-04-19 LAB — URINALYSIS, ROUTINE W REFLEX MICROSCOPIC
Bilirubin, UA: NEGATIVE
Glucose, UA: NEGATIVE
Ketones, UA: NEGATIVE
Leukocytes,UA: NEGATIVE
Nitrite, UA: NEGATIVE
Protein,UA: NEGATIVE
RBC, UA: NEGATIVE
Specific Gravity, UA: 1.022 (ref 1.005–1.030)
Urobilinogen, Ur: 0.2 mg/dL (ref 0.2–1.0)
pH, UA: 6 (ref 5.0–7.5)

## 2021-04-19 LAB — RHEUMATOID FACTOR: Rheumatoid fact SerPl-aCnc: <10 IU/mL (ref <14.0)

## 2021-04-19 LAB — CRYOGLOBULIN

## 2021-04-19 LAB — ANA W/REFLEX: Anti Nuclear Antibody (ANA): NEGATIVE

## 2021-04-19 LAB — SEDIMENTATION RATE: Sed Rate: 14 mm/hr (ref 0–40)

## 2021-05-07 DIAGNOSIS — R209 Unspecified disturbances of skin sensation: Secondary | ICD-10-CM | POA: Diagnosis not present

## 2021-05-07 DIAGNOSIS — I872 Venous insufficiency (chronic) (peripheral): Secondary | ICD-10-CM | POA: Diagnosis not present

## 2021-05-07 DIAGNOSIS — G35 Multiple sclerosis: Secondary | ICD-10-CM | POA: Diagnosis not present

## 2021-05-07 DIAGNOSIS — M7989 Other specified soft tissue disorders: Secondary | ICD-10-CM | POA: Diagnosis not present

## 2021-05-19 DIAGNOSIS — G35 Multiple sclerosis: Secondary | ICD-10-CM | POA: Diagnosis not present

## 2021-05-23 ENCOUNTER — Other Ambulatory Visit: Payer: Self-pay | Admitting: Neurology

## 2021-05-29 ENCOUNTER — Other Ambulatory Visit: Payer: Self-pay | Admitting: Neurology

## 2021-05-29 MED ORDER — AMPHETAMINE-DEXTROAMPHETAMINE 20 MG PO TABS: 20.0000 mg | ORAL_TABLET | Freq: Two times a day (BID) | ORAL | 0 refills | Status: DC

## 2021-05-29 NOTE — Telephone Encounter (Signed)
Received refill request for Adderall.  Last OV was on 04/14/21.  Next OV is scheduled for 10/16/20 .  Last RX was written on 04/14/21 for 60 tabs.   [place] [facility] Database has been reviewed.

## 2021-06-18 DIAGNOSIS — M25512 Pain in left shoulder: Secondary | ICD-10-CM | POA: Diagnosis not present

## 2021-06-24 ENCOUNTER — Other Ambulatory Visit: Payer: Self-pay | Admitting: Neurology

## 2021-07-04 DIAGNOSIS — M25512 Pain in left shoulder: Secondary | ICD-10-CM | POA: Diagnosis not present

## 2021-07-08 DIAGNOSIS — L570 Actinic keratosis: Secondary | ICD-10-CM | POA: Diagnosis not present

## 2021-07-08 DIAGNOSIS — Z85828 Personal history of other malignant neoplasm of skin: Secondary | ICD-10-CM | POA: Diagnosis not present

## 2021-07-08 DIAGNOSIS — L821 Other seborrheic keratosis: Secondary | ICD-10-CM | POA: Diagnosis not present

## 2021-07-11 ENCOUNTER — Other Ambulatory Visit: Payer: Self-pay | Admitting: Neurology

## 2021-07-11 ENCOUNTER — Encounter: Payer: Self-pay | Admitting: Neurology

## 2021-07-14 MED ORDER — AMPHETAMINE-DEXTROAMPHETAMINE 20 MG PO TABS: 20.0000 mg | ORAL_TABLET | Freq: Two times a day (BID) | ORAL | 0 refills | Status: DC

## 2021-07-14 NOTE — Telephone Encounter (Signed)
Received refill request for Adderall. Last OV was on 04/14/21.  Next OV is scheduled for 10/16/21 .  Last RX was written on 05/29/21 for 60 tabs.   [place] [facility] Database has been reviewed.

## 2021-07-15 DIAGNOSIS — G35 Multiple sclerosis: Secondary | ICD-10-CM | POA: Diagnosis not present

## 2021-08-01 DIAGNOSIS — G35 Multiple sclerosis: Secondary | ICD-10-CM | POA: Diagnosis not present

## 2021-08-01 DIAGNOSIS — G4700 Insomnia, unspecified: Secondary | ICD-10-CM | POA: Diagnosis not present

## 2021-08-01 DIAGNOSIS — R270 Ataxia, unspecified: Secondary | ICD-10-CM | POA: Diagnosis not present

## 2021-08-01 DIAGNOSIS — M25512 Pain in left shoulder: Secondary | ICD-10-CM | POA: Diagnosis not present

## 2021-08-01 DIAGNOSIS — R300 Dysuria: Secondary | ICD-10-CM | POA: Diagnosis not present

## 2021-08-06 DIAGNOSIS — E7800 Pure hypercholesterolemia, unspecified: Secondary | ICD-10-CM | POA: Diagnosis not present

## 2021-08-06 DIAGNOSIS — K219 Gastro-esophageal reflux disease without esophagitis: Secondary | ICD-10-CM | POA: Diagnosis not present

## 2021-08-06 DIAGNOSIS — F419 Anxiety disorder, unspecified: Secondary | ICD-10-CM | POA: Diagnosis not present

## 2021-08-06 DIAGNOSIS — G8929 Other chronic pain: Secondary | ICD-10-CM | POA: Diagnosis not present

## 2021-08-06 DIAGNOSIS — Z79891 Long term (current) use of opiate analgesic: Secondary | ICD-10-CM | POA: Diagnosis not present

## 2021-08-06 DIAGNOSIS — Z993 Dependence on wheelchair: Secondary | ICD-10-CM | POA: Diagnosis not present

## 2021-08-06 DIAGNOSIS — R1310 Dysphagia, unspecified: Secondary | ICD-10-CM | POA: Diagnosis not present

## 2021-08-06 DIAGNOSIS — M8580 Other specified disorders of bone density and structure, unspecified site: Secondary | ICD-10-CM | POA: Diagnosis not present

## 2021-08-06 DIAGNOSIS — G4700 Insomnia, unspecified: Secondary | ICD-10-CM | POA: Diagnosis not present

## 2021-08-06 DIAGNOSIS — G629 Polyneuropathy, unspecified: Secondary | ICD-10-CM | POA: Diagnosis not present

## 2021-08-06 DIAGNOSIS — Z9181 History of falling: Secondary | ICD-10-CM | POA: Diagnosis not present

## 2021-08-06 DIAGNOSIS — I8310 Varicose veins of unspecified lower extremity with inflammation: Secondary | ICD-10-CM | POA: Diagnosis not present

## 2021-08-06 DIAGNOSIS — I8393 Asymptomatic varicose veins of bilateral lower extremities: Secondary | ICD-10-CM | POA: Diagnosis not present

## 2021-08-06 DIAGNOSIS — G909 Disorder of the autonomic nervous system, unspecified: Secondary | ICD-10-CM | POA: Diagnosis not present

## 2021-08-06 DIAGNOSIS — G35 Multiple sclerosis: Secondary | ICD-10-CM | POA: Diagnosis not present

## 2021-08-06 DIAGNOSIS — K6289 Other specified diseases of anus and rectum: Secondary | ICD-10-CM | POA: Diagnosis not present

## 2021-08-11 ENCOUNTER — Other Ambulatory Visit: Payer: Self-pay | Admitting: Neurology

## 2021-08-13 ENCOUNTER — Other Ambulatory Visit: Payer: Self-pay | Admitting: Neurology

## 2021-08-13 DIAGNOSIS — Z0000 Encounter for general adult medical examination without abnormal findings: Secondary | ICD-10-CM | POA: Diagnosis not present

## 2021-08-13 DIAGNOSIS — G909 Disorder of the autonomic nervous system, unspecified: Secondary | ICD-10-CM | POA: Diagnosis not present

## 2021-08-13 DIAGNOSIS — G4700 Insomnia, unspecified: Secondary | ICD-10-CM | POA: Diagnosis not present

## 2021-08-13 DIAGNOSIS — E7800 Pure hypercholesterolemia, unspecified: Secondary | ICD-10-CM | POA: Diagnosis not present

## 2021-08-13 DIAGNOSIS — G35 Multiple sclerosis: Secondary | ICD-10-CM | POA: Diagnosis not present

## 2021-08-14 MED ORDER — AMPHETAMINE-DEXTROAMPHETAMINE 20 MG PO TABS: 20.0000 mg | ORAL_TABLET | Freq: Two times a day (BID) | ORAL | 0 refills | Status: DC

## 2021-08-14 NOTE — Telephone Encounter (Signed)
Received refill request for Adderall.  Last OV was on 04/14/21.  Next OV is scheduled for 10/16/21 .  Last RX was written on 07/14/21 for 60 tabs.   [place] [facility] Database has been reviewed.

## 2021-08-20 ENCOUNTER — Other Ambulatory Visit: Payer: Self-pay | Admitting: Family Medicine

## 2021-08-20 DIAGNOSIS — M8580 Other specified disorders of bone density and structure, unspecified site: Secondary | ICD-10-CM

## 2021-08-31 ENCOUNTER — Other Ambulatory Visit: Payer: Self-pay | Admitting: Neurology

## 2021-09-04 DIAGNOSIS — M25512 Pain in left shoulder: Secondary | ICD-10-CM | POA: Diagnosis not present

## 2021-09-05 ENCOUNTER — Other Ambulatory Visit (HOSPITAL_COMMUNITY): Payer: Self-pay | Admitting: Sports Medicine

## 2021-09-05 ENCOUNTER — Other Ambulatory Visit: Payer: Self-pay | Admitting: Sports Medicine

## 2021-09-05 DIAGNOSIS — M25512 Pain in left shoulder: Secondary | ICD-10-CM

## 2021-09-08 ENCOUNTER — Other Ambulatory Visit: Payer: Self-pay | Admitting: Sports Medicine

## 2021-09-08 ENCOUNTER — Other Ambulatory Visit (HOSPITAL_COMMUNITY): Payer: Self-pay | Admitting: Sports Medicine

## 2021-09-08 DIAGNOSIS — M25512 Pain in left shoulder: Secondary | ICD-10-CM

## 2021-09-10 DIAGNOSIS — Z79891 Long term (current) use of opiate analgesic: Secondary | ICD-10-CM | POA: Diagnosis not present

## 2021-09-10 DIAGNOSIS — G8929 Other chronic pain: Secondary | ICD-10-CM | POA: Diagnosis not present

## 2021-09-10 DIAGNOSIS — G35 Multiple sclerosis: Secondary | ICD-10-CM | POA: Diagnosis not present

## 2021-09-10 DIAGNOSIS — Z9181 History of falling: Secondary | ICD-10-CM | POA: Diagnosis not present

## 2021-09-10 DIAGNOSIS — K219 Gastro-esophageal reflux disease without esophagitis: Secondary | ICD-10-CM | POA: Diagnosis not present

## 2021-09-10 DIAGNOSIS — I8310 Varicose veins of unspecified lower extremity with inflammation: Secondary | ICD-10-CM | POA: Diagnosis not present

## 2021-09-10 DIAGNOSIS — G909 Disorder of the autonomic nervous system, unspecified: Secondary | ICD-10-CM | POA: Diagnosis not present

## 2021-09-10 DIAGNOSIS — K6289 Other specified diseases of anus and rectum: Secondary | ICD-10-CM | POA: Diagnosis not present

## 2021-09-10 DIAGNOSIS — G629 Polyneuropathy, unspecified: Secondary | ICD-10-CM | POA: Diagnosis not present

## 2021-09-10 DIAGNOSIS — R1310 Dysphagia, unspecified: Secondary | ICD-10-CM | POA: Diagnosis not present

## 2021-09-10 DIAGNOSIS — E7800 Pure hypercholesterolemia, unspecified: Secondary | ICD-10-CM | POA: Diagnosis not present

## 2021-09-10 DIAGNOSIS — M8580 Other specified disorders of bone density and structure, unspecified site: Secondary | ICD-10-CM | POA: Diagnosis not present

## 2021-09-10 DIAGNOSIS — I8393 Asymptomatic varicose veins of bilateral lower extremities: Secondary | ICD-10-CM | POA: Diagnosis not present

## 2021-09-10 DIAGNOSIS — G4700 Insomnia, unspecified: Secondary | ICD-10-CM | POA: Diagnosis not present

## 2021-09-10 DIAGNOSIS — F419 Anxiety disorder, unspecified: Secondary | ICD-10-CM | POA: Diagnosis not present

## 2021-09-10 DIAGNOSIS — Z993 Dependence on wheelchair: Secondary | ICD-10-CM | POA: Diagnosis not present

## 2021-09-13 ENCOUNTER — Other Ambulatory Visit: Payer: Self-pay | Admitting: Neurology

## 2021-09-15 MED ORDER — AMPHETAMINE-DEXTROAMPHETAMINE 20 MG PO TABS: 20.0000 mg | ORAL_TABLET | Freq: Two times a day (BID) | ORAL | 0 refills | Status: DC

## 2021-09-15 NOTE — Telephone Encounter (Signed)
Last OV was on 1017/22.  ?Next OV is scheduled for 10/16/21 .  ?Last RX was written on 08/14/21 for 60 tabs.  ? ?[place] [facility] Database has been reviewed.  ?

## 2021-09-21 ENCOUNTER — Other Ambulatory Visit: Payer: Self-pay

## 2021-09-21 ENCOUNTER — Ambulatory Visit
Admission: RE | Admit: 2021-09-21 | Discharge: 2021-09-21 | Disposition: A | Discharge status: Home / Self Care | Payer: Medicare Other | Source: Ambulatory Visit | Attending: Sports Medicine | Admitting: Sports Medicine

## 2021-09-21 DIAGNOSIS — M25512 Pain in left shoulder: Secondary | ICD-10-CM

## 2021-09-24 ENCOUNTER — Telehealth: Payer: Self-pay | Admitting: *Deleted

## 2021-09-24 NOTE — Telephone Encounter (Signed)
Took call from phone staff and spoke w/ Saralyn Pilar from [place]. He wanted to confirm pt on both diazepam/clonazepam. I confirmed she was per previous MS notes. He verbalized understanding.  ?

## 2021-09-25 DIAGNOSIS — M19012 Primary osteoarthritis, left shoulder: Secondary | ICD-10-CM | POA: Diagnosis not present

## 2021-10-08 ENCOUNTER — Other Ambulatory Visit: Payer: Self-pay | Admitting: Neurology

## 2021-10-09 NOTE — Telephone Encounter (Signed)
Last OV was on 04/14/21.  ?Next OV is scheduled for 10/16/21.  ?Last RX was written on 09/01/21 for 90 tabs.  ? ?[place] [facility] Database has been reviewed.  ?

## 2021-10-16 ENCOUNTER — Ambulatory Visit: Payer: Medicare Other | Admitting: Neurology

## 2021-10-16 ENCOUNTER — Encounter: Payer: Self-pay | Admitting: Neurology

## 2021-10-16 VITALS — BP 99/60 | HR 77 | Ht 65.0 in

## 2021-10-16 DIAGNOSIS — R42 Dizziness and giddiness: Secondary | ICD-10-CM

## 2021-10-16 DIAGNOSIS — G35 Multiple sclerosis: Secondary | ICD-10-CM

## 2021-10-16 DIAGNOSIS — R260 Ataxic gait: Secondary | ICD-10-CM | POA: Diagnosis not present

## 2021-10-16 DIAGNOSIS — R5383 Other fatigue: Secondary | ICD-10-CM

## 2021-10-16 DIAGNOSIS — R208 Other disturbances of skin sensation: Secondary | ICD-10-CM

## 2021-10-16 DIAGNOSIS — R112 Nausea with vomiting, unspecified: Secondary | ICD-10-CM

## 2021-10-16 MED ORDER — ONDANSETRON HCL 4 MG PO TABS: 4.0000 mg | ORAL_TABLET | Freq: Three times a day (TID) | ORAL | 3 refills | Status: DC | PRN

## 2021-10-16 MED ORDER — AMPHETAMINE-DEXTROAMPHETAMINE 20 MG PO TABS: 20.0000 mg | ORAL_TABLET | Freq: Two times a day (BID) | ORAL | 0 refills | Status: DC

## 2021-10-16 NOTE — Progress Notes (Signed)
? ?[facility] ? ?PATIENT: Tonya Powers ?DOB: 06/07/1961 ? ?REFERRING CLINICIAN: Mayra Neer is PCP ?HISTORY FROM: Patient  ? ?REASON FOR VISIT: MS and poor gait ? ? ?HISTORICAL ? ?CHIEF COMPLAINT:  ?Chief Complaint  ?Patient presents with  ? Follow-up  ?  RM 2, alone. Last seen 04/14/21.  ? ? ?HISTORY OF PRESENT ILLNESS:  ?Tonya Powers is a 61 y.o. woman with an active form of secondary progressive MS.     ? ?Update 10/16/2021 ?She is seeing Orthopedics (Dr. Onnie Graham) and may need to have a shoulder replacement.   She is concerned about recovery due to her weakness f need to use both hands to transfer.  ? ?She is off any DMT for MS (has non-relapsing SPMS).   She has not had any exacerbations but does note mild worsening of strength, gait and spasticity. ? ?She has some vertigo still but is much better.    Dramamine helps.    She has near daily nausea and is taking meclizine with benefit.   Its worse when she wakes up.   Zofran may he helped in past but was expensive.   ? ?She often wakes up  is having more nausea and HA.    She had to cut her beach vacation short due to feeling sick.   She has more urinary burning and frequency and feels urine is cloudy.     ? ?She has dysesthesias in her feet and abdomen.  Wearing socks or pants is uncomfortable and she usually just wears a nightgown.   She is is on gabapentin, lamotrigine and tramadol.   Lidocaine ointment had not helped.  Sometimes she uses ice with some benefit.  ? ?She has swelling in her feet. Feet feel hot and are red but are usually cold to the touch.  They are numb.  She saw [facility] and had studies.    Arteries were fine.    Venous doppler showed slow flow in the superficial vein.    Pentoxifylline was poorly tolerated.  Nifedipine had not helped.   ? ?She has MS related ADD and fatigue, helped by Adderall 20 mg po bid.   ? ? ?  ?MS History:  She presented with optic neuritis in 1991 followed shortly by difficulties with leg weakness and  by right trigeminal neuralgia. In retrospect, couple years earlier when she had her daughter, has some difficulties with her legs and needed to go on short-term disability. At first she was not diagnosed with MS but after more of the symptoms she underwent MRI testing and had a lumbar puncture by Dr. Johnnye Sima. The imaging and the CSF was consistent with multiple sclerosis. When Betaseron became available she was placed on that. She was on Betaseron for about 10 years. She felt that she did not have too many exacerbations during that time but she had a lot of difficulty tolerating the Betaseron due to skin reactions. Around 12-15 years ago, she started to use a cane and she has had progressive gait disturbance over the last decade. Around the house for short distances, she uses a walker but uses her scooter for longer distances. Outside she uses a wheelchair pushed by others   She tried Ocrevus in 2018 but felt worse so stopped in 2020.  She has done Solu-Medrol ? ?.  ?MRI Brain 02/28/2016 showed T2/FLAIR hyperintense foci in the periventricular, juxtacortical and deep white matter of both hemispheres and in the left cerebellar hemisphere in a pattern and configuration consistent  with chronic demyelinating plaque associated with multiple sclerosis. None of the foci enhances. Compared to the MRI from 04/04/2015, there is no interval change. ? ?MRI Cervical spine showed  multiple lesions within the spinal cord adjacent to C2-C3, C3, C4-C5, C5-C6 and C7-T1. When compared to the MRI dated 04/04/2015, these lesions appear to be chronic. ? ?REVIEW OF SYSTEMS:  ?Constitutional: No fevers, chills, sweats, or change in appetite.  She has Fatigue ?Eyes: No visual changes, double vision, eye pain ?Ear, nose and throat: No hearing loss, ear pain, nasal congestion, sore throat ?Cardiovascular: No chest pain, palpitations ?Respiratory:  No shortness of breath at rest or with exertion.   No wheezes ?GastrointestinaI: Mild dysphagia.   Constipation.  No nausea, vomiting, diarrhea.   ?Genitourinary:  No dysuria but has urinary retention and frequency.  Desmopressin helps nocturia. ?Musculoskeletal:  No neck pain,   Has lower back pain and buttocks.  Hips ans other joints ok ?Integumentary: No rash, pruritus, skin lesions ?Neurological: as above ?Psychiatric: see above ?Endocrine: No palpitations, diaphoresis, change in appetite, change in weigh or increased thirst ?Hematologic/Lymphatic:  No anemia, purpura, petechiae. ?Allergic/Immunologic: No itchy/runny eyes, nasal congestion, recent allergic reactions, rashes ? ?ALLERGIES: ?Allergies  ?Allergen Reactions  ? Codeine Nausea And Vomiting  ? ? ?HOME MEDICATIONS: ?Outpatient Medications Prior to Visit  ?Medication Sig Dispense Refill  ? acetaminophen (TYLENOL) 500 MG tablet Take 1,000 mg by mouth daily as needed for pain.    ? amphetamine-dextroamphetamine (ADDERALL) 20 MG tablet Take 1 tablet (20 mg total) by mouth 2 (two) times daily. 60 tablet 0  ? Cholecalciferol (VITAMIN D PO) Take 1 tablet by mouth daily.    ? clonazePAM (KLONOPIN) 1 MG tablet TAKE 1 TABLET(1 MG) BY MOUTH AT BEDTIME AS NEEDED 30 tablet 3  ? diazepam (VALIUM) 5 MG tablet TAKE 1 TABLET BY MOUTH EVERY 8 HOURS AS NEEDED 90 tablet 5  ? escitalopram (LEXAPRO) 10 MG tablet Take 1 tablet (10 mg total) by mouth daily. 90 tablet 0  ? gabapentin (NEURONTIN) 800 MG tablet TAKE 1 TABLET(800 MG) BY MOUTH FOUR TIMES DAILY 120 tablet 11  ? lamoTRIgine (LAMICTAL) 200 MG tablet Take 1 tablet (200 mg total) by mouth 3 (three) times daily. 270 tablet 1  ? lidocaine (LIDODERM) 5 % Place 1 patch daily onto the skin. Remove & Discard patch within 12 hours or as directed by MD 30 patch 11  ? meclizine (ANTIVERT) 25 MG tablet TAKE 1 TABLET(25 MG) BY MOUTH THREE TIMES DAILY AS NEEDED FOR DIZZINESS 30 tablet 2  ? omeprazole (PRILOSEC) 40 MG capsule Take 40 mg by mouth 2 (two) times daily.    ? promethazine (PHENERGAN) 25 MG tablet Take 1 tablet (25 mg  total) by mouth every 6 (six) hours as needed for nausea (nausea). 30 tablet 3  ? rOPINIRole (REQUIP) 0.5 MG tablet TAKE 1 TO 2 TABLETS BY MOUTH AT BEDTIME 60 tablet 5  ? traMADol (ULTRAM) 50 MG tablet TAKE 1 TABLET BY MOUTH FOUR TIMES DAILY AS NEEDED FOR MODERATE PAIN 120 tablet 5  ? zolpidem (AMBIEN) 5 MG tablet TAKE 1 TABLET(5 MG) BY MOUTH AT BEDTIME AS NEEDED FOR SLEEP 30 tablet 0  ? NIFEdipine (PROCARDIA-XL/NIFEDICAL-XL) 30 MG 24 hr tablet Take 1 tablet (30 mg total) by mouth daily. 30 tablet 11  ? sulfamethoxazole-trimethoprim (BACTRIM DS) 800-160 MG tablet Take 1 tablet by mouth 2 (two) times daily. 14 tablet 0  ? ?No facility-administered medications prior to visit.  ? ? ?PAST MEDICAL  HISTORY: ?Past Medical History:  ?Diagnosis Date  ? Anxiety   ? Depression   ? Gait instability   ? uses wheelchair or assistance  ? H/O steroid therapy   ? IV infusion every 6 to 8 weeks- last 05-31-14 [facility]  ? History of breast biopsy   ? Multiple sclerosis ([place])   ? Neuromuscular disorder ([place])   ? MS  ? PONV (postoperative nausea and vomiting)   ? Skin cancer   ? ? ?PAST SURGICAL HISTORY: ?Past Surgical History:  ?Procedure Laterality Date  ? ABDOMINAL HYSTERECTOMY    ? BREAST BIOPSY Right 09/20/2012  ? Procedure: BREAST BIOPSY WITH NEEDLE LOCALIZATION;  Surgeon: Rolm Bookbinder, MD;  Location: [facility];  Service: General;  Laterality: Right;  ? BREAST EXCISIONAL BIOPSY Right   ? benign  ? CESAREAN SECTION    ? COLONOSCOPY WITH PROPOFOL N/A 07/13/2014  ? Procedure: COLONOSCOPY WITH PROPOFOL;  Surgeon: Beryle Beams, MD;  [facility];  Service: Endoscopy;  Laterality: N/A;  ? COLONOSCOPY WITH PROPOFOL N/A 12/13/2020  ? Procedure: COLONOSCOPY WITH PROPOFOL;  Surgeon: Carol Ada, MD;  [facility];  Service: Endoscopy;  Laterality: N/A;  ? DEBRIDEMENT SKIN / SQ / MUSCLE OF ARM Right 07/13/2003  ? I & D; debridement of tissue within triceps muscle  ?  ESOPHAGOGASTRODUODENOSCOPY (EGD) WITH PROPOFOL N/A 12/13/2020  ? Procedure: ESOPHAGOGASTRODUODENOSCOPY (EGD) WITH PROPOFOL;  Surgeon: Carol Ada, MD;  [facility];  Service: Endoscopy;  Laterality: N/A;  ? LEG S

## 2021-10-20 ENCOUNTER — Encounter: Payer: Self-pay | Admitting: Neurology

## 2021-10-21 DIAGNOSIS — M25512 Pain in left shoulder: Secondary | ICD-10-CM | POA: Diagnosis not present

## 2021-10-22 DIAGNOSIS — G35 Multiple sclerosis: Secondary | ICD-10-CM | POA: Diagnosis not present

## 2021-11-10 ENCOUNTER — Other Ambulatory Visit: Payer: Self-pay | Admitting: Neurology

## 2021-11-11 NOTE — Telephone Encounter (Signed)
Last OV was on 10/16/21.  ?Next OV is scheduled for 05/14/22.  ?Last RX was written on 10/09/21 for 30 tabs.  ? ?[place] [facility] Database has been reviewed.  ?

## 2021-11-22 ENCOUNTER — Other Ambulatory Visit: Payer: Self-pay | Admitting: Neurology

## 2021-11-24 ENCOUNTER — Other Ambulatory Visit: Payer: Self-pay | Admitting: Neurology

## 2021-11-25 MED ORDER — AMPHETAMINE-DEXTROAMPHETAMINE 20 MG PO TABS: 20.0000 mg | ORAL_TABLET | Freq: Two times a day (BID) | ORAL | 0 refills | Status: DC

## 2021-11-25 NOTE — Telephone Encounter (Signed)
Last OV was on 10/16/21.  Next OV is scheduled for 05/14/22.  Last RX was written on 10/16/21 for 60 tabs.   [place] [facility] Database has been reviewed.

## 2021-11-25 NOTE — Telephone Encounter (Signed)
Last OV was on 10/16/21.  Next OV is scheduled for 05/14/22.  Last RX was written on 09/24/21 for 30 tabs.   [place] [facility] Database has been reviewed.

## 2021-11-26 ENCOUNTER — Encounter: Payer: Self-pay | Admitting: Neurology

## 2021-11-27 ENCOUNTER — Other Ambulatory Visit: Payer: Self-pay | Admitting: Neurology

## 2021-11-27 MED ORDER — TAPENTADOL HCL 50 MG PO TABS: 50.0000 mg | ORAL_TABLET | Freq: Two times a day (BID) | ORAL | 0 refills | Status: DC | PRN

## 2021-12-09 ENCOUNTER — Other Ambulatory Visit: Payer: Self-pay | Admitting: Neurology

## 2021-12-10 NOTE — Telephone Encounter (Signed)
Last OV was on 10/16/21.  Next OV is scheduled for 05/14/22.  Last RX was written on 11/11/21 for 30 tabs.   [place] [facility] Database has been reviewed.

## 2021-12-18 ENCOUNTER — Other Ambulatory Visit: Payer: Self-pay | Admitting: Neurology

## 2021-12-18 ENCOUNTER — Ambulatory Visit: Payer: Medicare Other | Admitting: Internal Medicine

## 2021-12-19 ENCOUNTER — Ambulatory Visit: Payer: Medicare Other | Admitting: Internal Medicine

## 2021-12-20 ENCOUNTER — Other Ambulatory Visit: Payer: Self-pay | Admitting: Neurology

## 2021-12-22 DIAGNOSIS — M25512 Pain in left shoulder: Secondary | ICD-10-CM | POA: Diagnosis not present

## 2022-01-02 ENCOUNTER — Encounter: Payer: Self-pay | Admitting: Neurology

## 2022-01-02 ENCOUNTER — Other Ambulatory Visit: Payer: Self-pay | Admitting: Neurology

## 2022-01-05 ENCOUNTER — Ambulatory Visit: Payer: Medicare Other | Admitting: Internal Medicine

## 2022-01-05 ENCOUNTER — Encounter: Payer: Self-pay | Admitting: Internal Medicine

## 2022-01-05 VITALS — BP 130/70 | HR 71 | Temp 98.8°F | Resp 18 | Ht 65.0 in | Wt 150.0 lb

## 2022-01-05 DIAGNOSIS — L230 Allergic contact dermatitis due to metals: Secondary | ICD-10-CM | POA: Diagnosis not present

## 2022-01-05 DIAGNOSIS — G35 Multiple sclerosis: Secondary | ICD-10-CM

## 2022-01-05 MED ORDER — AMPHETAMINE-DEXTROAMPHETAMINE 20 MG PO TABS: 20.0000 mg | ORAL_TABLET | Freq: Two times a day (BID) | ORAL | 0 refills | Status: DC

## 2022-01-05 NOTE — Progress Notes (Signed)
New Patient Note  RE: Tonya Powers MRN: [id number] DOB: Mar 12, 1961 Date of Office Visit: 01/05/2022  Consult requested by: Justice Britain, MD Primary care provider: Mayra Neer, MD  Chief Complaint: No chief complaint on file.  History of Present Illness: I had the pleasure of seeing Tonya Powers for initial evaluation at the [facility] on 01/05/2022. She is a 61 y.o. female, who is referred here by Mayra Neer, MD for the evaluation of metal allergy. She is scheduled for a shoulder replacement and surgeon requested metal patch testing due to history of implant rejections.    History obtained from patient  and  chart review .   As a child she had plates and screws placed in leg and developed gangrene and implants had to removed.  She also had a root canal which contained unknown metal and had to have procedure redone 3 times due to poor helaing and ultimately had to have a bridge implanted. Lastly she had a metal implant placed for tracking a breast lump and developed severe pain.  Both the implant and lump ultimately had to be removed.    She has MS and is wheelchair bound.  She does receive solumedrol injections during MS flares, but last one was 2 months ago.  Denies any topical steroid use or prednisone in over 2 weeks.    Assessment and Plan: [name] is a 61 y.o. female with: Allergic contact dermatitis due to metals - Plan: Patch Test  Multiple sclerosis [facility]) Plan: Patient Instructions   Allergic Contact dermatitis - metals   Discussed with patient that patch testing tests for contact dermatitis and sometimes it does not correlate to how one will react to metals in the body. Positive patch testing results can help in avoiding those items however it is possible to get false negative results.  Nevertheless, this is the most accessible test for metal sensitivity currently available.  Patches placed today. Please avoid strenuous physical activities and do  not get the patches on the back wet. No showering until final patch reading done. Okay to take antihistamines for itching but avoid placing any creams on the back where the patches are. We will remove the patches on Wednesday and will do our initial read. Then you will come back on Friday for a final read.   Thank you so much for letting me partake in your care today.  Don't hesitate to reach out if you have any additional concerns!  [name], MD  [facility] [place]         No follow-ups on file.  No orders of the defined types were placed in this encounter.  Lab Orders  No laboratory test(s) ordered today    Other allergy screening: Asthma: no Rhino conjunctivitis: no Food allergy: no Medication allergy:  metal allegry as above   Hymenoptera allergy: no Urticaria: no Eczema:no History of recurrent infections suggestive of immunodeficency: no  Diagnostics: None performed   Past Medical History: Patient Active Problem List   Diagnosis Date Noted   Complex regional pain syndrome type 1 of both lower extremities 12/10/2020   Long term systemic steroid user 06/06/2020   Fall 12/08/2019   Neck pain 09/14/2018   Vertigo 02/02/2017   Weakness of both lower extremities 02/02/2017   Right sided sciatica 11/19/2016   Trochanteric bursitis of right hip 11/19/2016   Benign paroxysmal positional vertigo 03/24/2016   Pedal edema 01/30/2016   Dysesthesia 01/30/2016  Gastroenteritis 08/16/2014   Nausea vomiting and diarrhea 08/16/2014   Hypokalemia 08/16/2014   Multiple sclerosis ([place]) 07/24/2014   Ataxic gait 07/24/2014   Weakness 07/24/2014   Urinary dysfunction 07/24/2014   Chronic insomnia 07/24/2014   Depression with anxiety 07/24/2014   Past Medical History:  Diagnosis Date   Anxiety    Depression    Gait instability    uses wheelchair or assistance   H/O steroid therapy    IV infusion every 6 to 8 weeks- last 05-31-14  [facility]   History of breast biopsy    Multiple sclerosis ([place])    Neuromuscular disorder ([place])    MS   PONV (postoperative nausea and vomiting)    Skin cancer    Past Surgical History: Past Surgical History:  Procedure Laterality Date   ABDOMINAL HYSTERECTOMY     BREAST BIOPSY Right 09/20/2012   Procedure: BREAST BIOPSY WITH NEEDLE LOCALIZATION;  Surgeon: Rolm Bookbinder, MD;  Location: [facility];  Service: General;  Laterality: Right;   BREAST EXCISIONAL BIOPSY Right    benign   CESAREAN SECTION     COLONOSCOPY WITH PROPOFOL N/A 07/13/2014   Procedure: COLONOSCOPY WITH PROPOFOL;  Surgeon: Beryle Beams, MD;  Location: [facility];  Service: Endoscopy;  Laterality: N/A;   COLONOSCOPY WITH PROPOFOL N/A 12/13/2020   Procedure: COLONOSCOPY WITH PROPOFOL;  Surgeon: Carol Ada, MD;  Location: [facility];  Service: Endoscopy;  Laterality: N/A;   DEBRIDEMENT SKIN / SQ / MUSCLE OF ARM Right 07/13/2003   I & D; debridement of tissue within triceps muscle   ESOPHAGOGASTRODUODENOSCOPY (EGD) WITH PROPOFOL N/A 12/13/2020   Procedure: ESOPHAGOGASTRODUODENOSCOPY (EGD) WITH PROPOFOL;  Surgeon: Carol Ada, MD;  Location: [facility];  Service: Endoscopy;  Laterality: N/A;   LEG SURGERY     sx as a child   MELANOMA EXCISION     PARTIAL HYSTERECTOMY     TONSILLECTOMY     as a child   Medication List:  Current Outpatient Medications  Medication Sig Dispense Refill   acetaminophen (TYLENOL) 500 MG tablet Take 1,000 mg by mouth daily as needed for pain.     amphetamine-dextroamphetamine (ADDERALL) 20 MG tablet Take 1 tablet (20 mg total) by mouth 2 (two) times daily. 60 tablet 0   clonazePAM (KLONOPIN) 1 MG tablet TAKE 1 TABLET(1 MG) BY MOUTH AT BEDTIME AS NEEDED 30 tablet 0   diazepam (VALIUM) 5 MG tablet TAKE 1 TABLET BY MOUTH EVERY 8 HOURS AS NEEDED 90 tablet 5   escitalopram (LEXAPRO) 10 MG tablet TAKE 1 TABLET(10 MG) BY MOUTH DAILY 90 tablet 1   gabapentin  (NEURONTIN) 800 MG tablet TAKE 1 TABLET(800 MG) BY MOUTH FOUR TIMES DAILY 120 tablet 11   lamoTRIgine (LAMICTAL) 200 MG tablet TAKE 1 TABLET BY MOUTH THREE TIMES DAILY 270 tablet 1   lidocaine (LIDODERM) 5 % Place 1 patch daily onto the skin. Remove & Discard patch within 12 hours or as directed by MD 30 patch 11   meclizine (ANTIVERT) 25 MG tablet TAKE 1 TABLET(25 MG) BY MOUTH THREE TIMES DAILY AS NEEDED FOR DIZZINESS 30 tablet 2   omeprazole (PRILOSEC) 40 MG capsule Take 40 mg by mouth 2 (two) times daily.     promethazine (PHENERGAN) 25 MG tablet Take 1 tablet (25 mg total) by mouth every 6 (six) hours as needed for nausea (nausea). 30 tablet 3   rOPINIRole (REQUIP) 0.5 MG tablet TAKE 1 TO 2 TABLETS BY MOUTH AT BEDTIME 60 tablet 5  traMADol (ULTRAM) 50 MG tablet TAKE 1 TABLET BY MOUTH FOUR TIMES DAILY AS NEEDED FOR MODERATE PAIN 120 tablet 3   zolpidem (AMBIEN) 5 MG tablet TAKE 1 TABLET(5 MG) BY MOUTH AT BEDTIME AS NEEDED FOR SLEEP 30 tablet 0   No current facility-administered medications for this visit.   Allergies: Allergies  Allergen Reactions   Codeine Nausea And Vomiting   Social History: Social History   Socioeconomic History   Marital status: Married    Spouse name: Not on file   Number of children: Not on file   Years of education: Not on file   Highest education level: Not on file  Occupational History   Not on file  Tobacco Use   Smoking status: Never    Passive exposure: Never   Smokeless tobacco: Never  Vaping Use   Vaping Use: Never used  Substance and Sexual Activity   Alcohol use: Not Currently    Comment: social   Drug use: No   Sexual activity: Not on file  Other Topics Concern   Not on file  Social History Narrative   Lives at home w/ her husband   Social Determinants of Health   Financial Resource Strain: Not on file  Food Insecurity: Not on file  Transportation Needs: Not on file  Physical Activity: Not on file  Stress: Not on file  Social  Connections: Not on file   Lives in a single-family home that is 63 years old.  There are roaches in the house and bed is not 2 feet off the floor.  She does use dust mite precautions on bed and pillows.  There is a HEPA filter in the home and home is not near an [place] industrial area.  She is not exposed to fumes, chemicals or dust.. Smoking: No exposure Occupation: She is on disability for MS  Environmental History: Water Damage/mildew in the house: no Carpet in the family room: yes Carpet in the bedroom: yes Heating: gas Cooling: central Pet: no  Family History: Family History  Problem Relation Age of Onset   Hyperlipidemia Mother    Hypertension Mother    Allergic rhinitis Father    Hypertension Father    Diabetes type II Father    Allergic rhinitis Sister      ROS: All others negative except as noted per HPI.   Objective: BP 130/70   Pulse 71   Temp 98.8 F (37.1 C) (Temporal)   Resp 18   Ht '5\' 5"'$  (1.651 m)   Wt 150 lb (68 kg)   SpO2 99%   BMI 24.96 kg/m  Body mass index is 24.96 kg/m.  General Appearance:  Alert, cooperative, no distress, appears stated age, in a wheelchair   Head:  Normocephalic, without obvious abnormality, atraumatic  Eyes:  Conjunctiva clear, EOM's intact  Nose: Nares normal,   Throat: Lips, tongue normal; teeth and gums normal,   Neck: Supple, symmetrical  Lungs:   , Respirations unlabored, no coughing  Heart:  , Appears well perfused  Extremities: No edema  Skin: Skin color, texture, turgor normal, no rashes or lesions on visualized portions of skin  Neurologic: No gross deficits   The plan was reviewed with the patient/family, and all questions/concerned were addressed.  It was my pleasure to see Marjan today and participate in her care. Please feel free to contact me with any questions or concerns.  Sincerely,  [name], MD Allergy & Immunology  [facility]  office:  [phone] [place] office: [phone]

## 2022-01-05 NOTE — Patient Instructions (Addendum)
Allergic Contact dermatitis - metals   Discussed with patient that patch testing tests for contact dermatitis and sometimes it does not correlate to how one will react to metals in the body. Positive patch testing results can help in avoiding those items however it is possible to get false negative results.  Nevertheless, this is the most accessible test for metal sensitivity currently available.  Patches placed today. Please avoid strenuous physical activities and do not get the patches on the back wet. No showering until final patch reading done. Okay to take antihistamines for itching but avoid placing any creams on the back where the patches are. We will remove the patches on Wednesday and will do our initial read. Then you will come back on Friday for a final read.   Thank you so much for letting me partake in your care today.  Don't hesitate to reach out if you have any additional concerns!  [name], MD  [facility] [place], [place]

## 2022-01-05 NOTE — Telephone Encounter (Signed)
Last OV was on 10/16/21.  Next OV is scheduled for 05/14/22.  Last RX was written on 11/25/21 for 60 tabs.   [place] Drug Database has been reviewed.

## 2022-01-07 ENCOUNTER — Ambulatory Visit: Payer: Medicare Other | Admitting: Allergy

## 2022-01-07 DIAGNOSIS — L230 Allergic contact dermatitis due to metals: Secondary | ICD-10-CM

## 2022-01-07 NOTE — Progress Notes (Signed)
    Follow-up Note  RE: Tonya Powers MRN: [id number] DOB: [birth year] Date of Office Visit: 01/07/2022  Primary care provider: Mayra Neer, MD Referring provider: Mayra Neer, MD   Naimah returns to the office today for the initial patch test interpretation, given suspected history of contact dermatitis.    Diagnostics:  Metal series 48 hour reading: negative    Plan:  Allergic contact dermatitis Negative testing first reading She will return in 2 days for final reading.   Prudy Feeler, MD [facility]

## 2022-01-08 ENCOUNTER — Other Ambulatory Visit: Payer: Self-pay | Admitting: Neurology

## 2022-01-09 ENCOUNTER — Ambulatory Visit: Payer: Medicare Other | Admitting: Internal Medicine

## 2022-01-09 ENCOUNTER — Encounter: Payer: Self-pay | Admitting: Internal Medicine

## 2022-01-09 DIAGNOSIS — L230 Allergic contact dermatitis due to metals: Secondary | ICD-10-CM

## 2022-01-09 NOTE — Patient Instructions (Addendum)
Allergic contact dermatitis Equivocal positive to aluminum hydroxide 10%  Negative to:  vanadium pentoxide 10%. chromicum chloride 1%, cobalt chloride hexahydrate 1%, molybdenum chloride 0.5%, tantal 1%, nickel sulfate hexahydrate 5%, potassium dichromate 0.25%, copper sulfate pentahydrate 2%, titanium 0.1%, manganese chloride 0.5%   Please take results to surgeon for further guidance on  shoulder replacement.    Follow up: as needed   Thank you so much for letting me partake in your care today.  Don't hesitate to reach out if you have any additional concerns!  [name], MD  [facility] [place], [place]

## 2022-01-09 NOTE — Progress Notes (Signed)
    Follow-up Note  RE: Tonya Powers MRN: [id number] DOB: 05/25/1961 Date of Office Visit: 01/09/2022  Primary care provider: Mayra Neer, MD Referring provider: Mayra Neer, MD   [name] returns to the office today for the initial patch test interpretation, given suspected history of contact dermatitis.    Diagnostics:  Metal series 48 hour reading: negative  Metal Series 96 hr reading: possible positive to aluminum hydroxide 10%, negative to all other metals     Plan:  Allergic contact dermatitis Equivocal positive to aluminum hydroxide 10%  Negative to vanadium pentoxide 10%. chromicum chloride 1%, cobalt chloride hexahydrate 1%, molybdenum chloride 0.5%, tantal 1%, nickel sulfate hexahydrate 5%, potassium dichromate 0.25%, copper sulfate pentahydrate 2%, titanium 0.1%, manganese chloride 0.5% a Follow up: as needed   Please take results to surgeon for further guidance on  shoulder replacement.    Thank you so much for letting me partake in your care today.  Don't hesitate to reach out if you have any additional concerns!  [name], MD  [facility] [employer], [place]

## 2022-01-15 ENCOUNTER — Other Ambulatory Visit: Payer: Self-pay | Admitting: Neurology

## 2022-01-19 NOTE — Telephone Encounter (Signed)
Last OV was on 10/16/21.  Next OV is scheduled for 05/14/22.  Last RX was written on 12/12/21 for 30 tabs.   [place] Drug Database has been reviewed.

## 2022-02-02 DIAGNOSIS — M25512 Pain in left shoulder: Secondary | ICD-10-CM | POA: Diagnosis not present

## 2022-02-05 ENCOUNTER — Ambulatory Visit
Admission: RE | Admit: 2022-02-05 | Discharge: 2022-02-05 | Disposition: A | Discharge status: Home / Self Care | Payer: Medicare Other | Source: Ambulatory Visit | Attending: Family Medicine | Admitting: Family Medicine

## 2022-02-05 DIAGNOSIS — M8580 Other specified disorders of bone density and structure, unspecified site: Secondary | ICD-10-CM

## 2022-02-05 DIAGNOSIS — M8588 Other specified disorders of bone density and structure, other site: Secondary | ICD-10-CM | POA: Diagnosis not present

## 2022-02-05 DIAGNOSIS — Z780 Asymptomatic menopausal state: Secondary | ICD-10-CM | POA: Diagnosis not present

## 2022-02-05 DIAGNOSIS — M810 Age-related osteoporosis without current pathological fracture: Secondary | ICD-10-CM | POA: Diagnosis not present

## 2022-02-11 ENCOUNTER — Other Ambulatory Visit: Payer: Self-pay | Admitting: Neurology

## 2022-02-12 MED ORDER — AMPHETAMINE-DEXTROAMPHETAMINE 20 MG PO TABS
20.0000 mg | ORAL_TABLET | Freq: Two times a day (BID) | ORAL | 0 refills | Status: DC
Start: 2022-02-12 — End: 2022-03-14

## 2022-02-12 NOTE — Telephone Encounter (Signed)
Last OV was on 10/16/21.  Next OV is scheduled for 05/14/22.  Last RX was written on 01/06/22 for 60 tabs.   [place] Drug Database has been reviewed.

## 2022-02-16 ENCOUNTER — Encounter: Payer: Self-pay | Admitting: Neurology

## 2022-02-17 ENCOUNTER — Other Ambulatory Visit: Payer: Self-pay | Admitting: Neurology

## 2022-02-18 NOTE — Telephone Encounter (Signed)
Last OV was on 10/16/21.  Next OV is scheduled for 05/14/22.  Last RX was written on 01/19/22 for 30 tabs.   [place] Drug Database has been reviewed.

## 2022-03-11 ENCOUNTER — Other Ambulatory Visit: Payer: Self-pay | Admitting: Neurology

## 2022-03-14 ENCOUNTER — Other Ambulatory Visit: Payer: Self-pay | Admitting: Neurology

## 2022-03-16 MED ORDER — AMPHETAMINE-DEXTROAMPHETAMINE 20 MG PO TABS
20.0000 mg | ORAL_TABLET | Freq: Two times a day (BID) | ORAL | 0 refills | Status: DC
Start: 2022-03-16 — End: 2022-04-17

## 2022-03-18 ENCOUNTER — Other Ambulatory Visit: Payer: Self-pay | Admitting: Neurology

## 2022-03-19 NOTE — Telephone Encounter (Signed)
Pt has up coming appointment and his been check in registry.

## 2022-03-23 DIAGNOSIS — G35 Multiple sclerosis: Secondary | ICD-10-CM | POA: Diagnosis not present

## 2022-03-26 DIAGNOSIS — N760 Acute vaginitis: Secondary | ICD-10-CM | POA: Diagnosis not present

## 2022-04-17 ENCOUNTER — Other Ambulatory Visit: Payer: Self-pay | Admitting: Neurology

## 2022-04-20 MED ORDER — AMPHETAMINE-DEXTROAMPHETAMINE 20 MG PO TABS: 20.0000 mg | ORAL_TABLET | Freq: Two times a day (BID) | ORAL | 0 refills | Status: DC

## 2022-04-22 ENCOUNTER — Encounter: Payer: Self-pay | Admitting: Neurology

## 2022-04-23 ENCOUNTER — Other Ambulatory Visit: Payer: Self-pay | Admitting: *Deleted

## 2022-04-23 MED ORDER — ZOLPIDEM TARTRATE 10 MG PO TABS: ORAL_TABLET | ORAL | 5 refills | Status: DC

## 2022-04-25 ENCOUNTER — Other Ambulatory Visit: Payer: Self-pay | Admitting: Neurology

## 2022-04-27 ENCOUNTER — Other Ambulatory Visit: Payer: Self-pay | Admitting: *Deleted

## 2022-04-27 MED ORDER — DIAZEPAM 5 MG PO TABS: 5.0000 mg | ORAL_TABLET | Freq: Three times a day (TID) | ORAL | 5 refills | Status: DC | PRN

## 2022-05-04 DIAGNOSIS — Z85828 Personal history of other malignant neoplasm of skin: Secondary | ICD-10-CM | POA: Diagnosis not present

## 2022-05-04 DIAGNOSIS — I8311 Varicose veins of right lower extremity with inflammation: Secondary | ICD-10-CM | POA: Diagnosis not present

## 2022-05-04 DIAGNOSIS — I8312 Varicose veins of left lower extremity with inflammation: Secondary | ICD-10-CM | POA: Diagnosis not present

## 2022-05-04 DIAGNOSIS — L239 Allergic contact dermatitis, unspecified cause: Secondary | ICD-10-CM | POA: Diagnosis not present

## 2022-05-14 ENCOUNTER — Ambulatory Visit: Payer: Medicare Other | Admitting: Neurology

## 2022-05-14 ENCOUNTER — Encounter: Payer: Self-pay | Admitting: Neurology

## 2022-05-14 VITALS — BP 117/75 | HR 68 | Ht 65.0 in

## 2022-05-14 DIAGNOSIS — R208 Other disturbances of skin sensation: Secondary | ICD-10-CM

## 2022-05-14 DIAGNOSIS — G35 Multiple sclerosis: Secondary | ICD-10-CM | POA: Diagnosis not present

## 2022-05-14 DIAGNOSIS — M542 Cervicalgia: Secondary | ICD-10-CM

## 2022-05-14 DIAGNOSIS — I7300 Raynaud's syndrome without gangrene: Secondary | ICD-10-CM

## 2022-05-14 DIAGNOSIS — R42 Dizziness and giddiness: Secondary | ICD-10-CM

## 2022-05-14 DIAGNOSIS — R5383 Other fatigue: Secondary | ICD-10-CM

## 2022-05-14 DIAGNOSIS — R39198 Other difficulties with micturition: Secondary | ICD-10-CM

## 2022-05-14 MED ORDER — DULOXETINE HCL 60 MG PO CPEP: 60.0000 mg | ORAL_CAPSULE | Freq: Every day | ORAL | 11 refills | Status: DC

## 2022-05-14 MED ORDER — AMPHETAMINE-DEXTROAMPHETAMINE 20 MG PO TABS: 20.0000 mg | ORAL_TABLET | Freq: Two times a day (BID) | ORAL | 0 refills | Status: DC

## 2022-05-14 NOTE — Progress Notes (Signed)
[facility]  PATIENT: Tonya Powers DOB: 06-20-1961  REFERRING CLINICIAN: Mayra Neer is PCP HISTORY FROM: Patient   REASON FOR VISIT: MS and poor gait   HISTORICAL  CHIEF COMPLAINT:  Chief Complaint  Patient presents with   Follow-up    RM 2, alone. Last seen 10/16/21.  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 Having worsening burning pain, no energy, saw dermatologist for rash on stomach. Felt it was from salonpas patches and told to stop this. Given topical cream for rash. Helps some. In more pain since she cannot use patches.    HISTORY OF PRESENT ILLNESS:  Tonya Powers is a 61 y.o. woman with an active form of secondary progressive MS.      Update 05/14/2022: She is off any DMT for MS (has non-relapsing SPMS).   She has not had any exacerbations but does note mild worsening of strength, gait and spasticity.  She has burning dysesthesias in her feet and abdomen.  Wearing socks or pants is uncomfortable and she usually just wears a nightgown.   She is is on gabapentin, lamotrigine and tramadol.   Lidocaine ointment had not helped.  Sometimes she uses ice with some benefit.   Many years ago she was on amitriptyline but it made  her very sleepy.   Feet are very red, especially in the morning.  Unclear if erythromelalgia.   She has not tried an ASA.  Feet tend to be hot when she wakes up and cold as the day goes on.     She has some vertigo still but is much better.    She did better after starting Lexapro.    She has near daily nausea and is taking meclizine with benefit.   Its worse when she wakes up.   Zofran may he helped in past but was expensive.    She often wakes up  is having more nausea and HA.    She had to cut her beach vacation short due to feeling sick.   She has more urinary burning and frequency and feels urine is cloudy.      She has swelling in her feet. Feet feel hot and are red but are usually cold to the touch.  They are numb.  She saw [facility] and had studies.     Arteries were fine.    Venous doppler showed slow flow in the superficial vein.    Pentoxifylline was poorly tolerated.  Nifedipine had not helped.    She is seeing Orthopedics (Dr. Onnie Graham) and may need to have a shoulder replacement.   She and the orthopedist are concerned about recovery due to her weakness f need to use both hands to transfer.   She has MS related ADD and fatigue, helped by Adderall 20 mg po bid.  When mor etired, we sometimes a day of IV Solumedrol.        MS History:  She presented with optic neuritis in 1991 followed shortly by difficulties with leg weakness and by right trigeminal neuralgia. In retrospect, couple years earlier when she had her daughter, has some difficulties with her legs and needed to go on short-term disability. At first she was not diagnosed with MS but after more of the symptoms she underwent MRI testing and had a lumbar puncture by Dr. Johnnye Sima. The imaging and the CSF was consistent with multiple sclerosis. When Betaseron became available she was placed on that. She was on Betaseron for about 10 years. She felt that she did not  have too many exacerbations during that time but she had a lot of difficulty tolerating the Betaseron due to skin reactions. Around 12-15 years ago, she started to use a cane and she has had progressive gait disturbance over the last decade. Around the house for short distances, she uses a walker but uses her scooter for longer distances. Outside she uses a wheelchair pushed by others   She tried Ocrevus in 2018 but felt worse so stopped in 2020.  She has done Solu-Medrol  .  MRI Brain 02/28/2016 showed T2/FLAIR hyperintense foci in the periventricular, juxtacortical and deep white matter of both hemispheres and in the left cerebellar hemisphere in a pattern and configuration consistent with chronic demyelinating plaque associated with multiple sclerosis. None of the foci enhances. Compared to the MRI from 04/04/2015, there is no interval  change.  MRI Cervical spine showed  multiple lesions within the spinal cord adjacent to C2-C3, C3, C4-C5, C5-C6 and C7-T1. When compared to the MRI dated 04/04/2015, these lesions appear to be chronic.  REVIEW OF SYSTEMS:  Constitutional: No fevers, chills, sweats, or change in appetite.  She has Fatigue Eyes: No visual changes, double vision, eye pain Ear, nose and throat: No hearing loss, ear pain, nasal congestion, sore throat Cardiovascular: No chest pain, palpitations Respiratory:  No shortness of breath at rest or with exertion.   No wheezes GastrointestinaI: Mild dysphagia.  Constipation.  No nausea, vomiting, diarrhea.   Genitourinary:  No dysuria but has urinary retention and frequency.  Desmopressin helps nocturia. Musculoskeletal:  No neck pain,   Has lower back pain and buttocks.  Hips ans other joints ok Integumentary: No rash, pruritus, skin lesions Neurological: as above Psychiatric: see above Endocrine: No palpitations, diaphoresis, change in appetite, change in weigh or increased thirst Hematologic/Lymphatic:  No anemia, purpura, petechiae. Allergic/Immunologic: No itchy/runny eyes, nasal congestion, recent allergic reactions, rashes  ALLERGIES: Allergies  Allergen Reactions   Codeine Nausea And Vomiting    HOME MEDICATIONS: Outpatient Medications Prior to Visit  Medication Sig Dispense Refill   acetaminophen (TYLENOL) 500 MG tablet Take 1,000 mg by mouth daily as needed for pain.     diazepam (VALIUM) 5 MG tablet Take 1 tablet (5 mg total) by mouth every 8 (eight) hours as needed. 90 tablet 5   gabapentin (NEURONTIN) 800 MG tablet TAKE 1 TABLET(800 MG) BY MOUTH FOUR TIMES DAILY 120 tablet 11   lamoTRIgine (LAMICTAL) 200 MG tablet TAKE 1 TABLET BY MOUTH THREE TIMES DAILY 270 tablet 1   lidocaine (LIDODERM) 5 % Place 1 patch daily onto the skin. Remove & Discard patch within 12 hours or as directed by MD 30 patch 11   meclizine (ANTIVERT) 25 MG tablet TAKE 1  TABLET(25 MG) BY MOUTH THREE TIMES DAILY AS NEEDED FOR DIZZINESS 30 tablet 2   omeprazole (PRILOSEC) 40 MG capsule Take 40 mg by mouth 2 (two) times daily.     promethazine (PHENERGAN) 25 MG tablet Take 1 tablet (25 mg total) by mouth every 6 (six) hours as needed for nausea (nausea). 30 tablet 3   rOPINIRole (REQUIP) 0.5 MG tablet Take 1-2 tablets (0.5-1 mg total) by mouth at bedtime. TAKE 1 TO 2 TABLETS BY MOUTH AT BEDTIME 60 tablet 5   traMADol (ULTRAM) 50 MG tablet TAKE 1 TABLET BY MOUTH FOUR TIMES DAILY AS NEEDED FOR MODERATE PAIN 120 tablet 3   zolpidem (AMBIEN) 10 MG tablet TAKE 1 TABLET(5 MG) BY MOUTH AT BEDTIME AS NEEDED FOR SLEEP 30 tablet 5  amphetamine-dextroamphetamine (ADDERALL) 20 MG tablet Take 1 tablet (20 mg total) by mouth 2 (two) times daily. 60 tablet 0   escitalopram (LEXAPRO) 10 MG tablet TAKE 1 TABLET(10 MG) BY MOUTH DAILY 90 tablet 1   No facility-administered medications prior to visit.    PAST MEDICAL HISTORY: Past Medical History:  Diagnosis Date   Anxiety    Depression    Gait instability    uses wheelchair or assistance   H/O steroid therapy    IV infusion every 6 to 8 weeks- last 05-31-14 [facility]   History of breast biopsy    Multiple sclerosis ([place])    Neuromuscular disorder ([place])    MS   PONV (postoperative nausea and vomiting)    Skin cancer     PAST SURGICAL HISTORY: Past Surgical History:  Procedure Laterality Date   ABDOMINAL HYSTERECTOMY     BREAST BIOPSY Right 09/20/2012   Procedure: BREAST BIOPSY WITH NEEDLE LOCALIZATION;  Surgeon: Rolm Bookbinder, MD;  Location: [place] [facility];  Service: General;  Laterality: Right;   BREAST EXCISIONAL BIOPSY Right    benign   CESAREAN SECTION     COLONOSCOPY WITH PROPOFOL N/A 07/13/2014   Procedure: COLONOSCOPY WITH PROPOFOL;  Surgeon: Beryle Beams, MD;  Location: [facility];  Service: Endoscopy;  Laterality: N/A;   COLONOSCOPY WITH PROPOFOL N/A 12/13/2020   Procedure:  COLONOSCOPY WITH PROPOFOL;  Surgeon: Carol Ada, MD;  Location: [facility];  Service: Endoscopy;  Laterality: N/A;   DEBRIDEMENT SKIN / SQ / MUSCLE OF ARM Right 07/13/2003   I & D; debridement of tissue within triceps muscle   ESOPHAGOGASTRODUODENOSCOPY (EGD) WITH PROPOFOL N/A 12/13/2020   Procedure: ESOPHAGOGASTRODUODENOSCOPY (EGD) WITH PROPOFOL;  Surgeon: Carol Ada, MD;  Location: [facility];  Service: Endoscopy;  Laterality: N/A;   LEG SURGERY     sx as a child   MELANOMA EXCISION     PARTIAL HYSTERECTOMY     TONSILLECTOMY     as a child    FAMILY HISTORY: Family History  Problem Relation Age of Onset   Hyperlipidemia Mother    Hypertension Mother    Allergic rhinitis Father    Hypertension Father    Diabetes type II Father    Allergic rhinitis Sister     SOCIAL HISTORY:  Social History   Socioeconomic History   Marital status: Married    Spouse name: Not on file   Number of children: Not on file   Years of education: Not on file   Highest education level: Not on file  Occupational History   Not on file  Tobacco Use   Smoking status: Never    Passive exposure: Never   Smokeless tobacco: Never  Vaping Use   Vaping Use: Never used  Substance and Sexual Activity   Alcohol use: Not Currently    Comment: social   Drug use: No   Sexual activity: Not on file  Other Topics Concern   Not on file  Social History Narrative   Lives at home w/ her husband   Social Determinants of Health   Financial Resource Strain: Not on file  Food Insecurity: Not on file  Transportation Needs: Not on file  Physical Activity: Not on file  Stress: Not on file  Social Connections: Not on file  Intimate Partner Violence: Not on file     PHYSICAL EXAM  Vitals:   05/14/22 1300  BP: 117/75  Pulse: 68  Height: '5\' 5"'$  (1.651 m)    Body  mass index is 24.96 kg/m.   General: The patient is well-developed and well-nourished and in no acute distress    Skin/Ext/Musculoskeletal: There is pedal edema..  Her feet are cold.  Neck is tender over bilateral occiput regions.  Lifting her head reduces the pain.     Neurologic Exam  Mental status: The patient is alert and oriented x 3 at the time of the examination. The patient has apparent normal recent and remote memory, with an apparently normal attention span and concentration ability.   Speech is normal.  Cranial nerves: Extraocular movements are full.  Facial strength and sensation was normal.  Trapezius strength is normal.. No dysarthria is noted.  No obvious hearing deficits are noted.  Motor:  Muscle bulk and tone are normal.  Strength is 2+/5 left and 3/5 right proximal leg strength.  Strength is 4- to 4/5 distally.  The arms are fairly strong.  Sensory: She has normal sensation to touch and vibration in the arms but reduced touch sensation in the left leg relative to the right  Coordination: Cerebellar testing reveals reduced finger-nose-finger and poor heel-to-shin bilaterally.  Gait and station: She needs support to stand.  She is unable to stand without bilateral support.  Reflexes: Deep tendon reflexes are symmetric and increased in legs bilaterally with spread at the knees.  She has nonsustained ankle clonus bilaterally.    DIAGNOSTIC DATA (LABS, IMAGING, TESTING) - I reviewed patient records, labs, notes, testing and imaging myself where available.  Lab Results  Component Value Date   WBC 8.5 06/07/2019   HGB 13.9 06/07/2019   HCT 40.8 06/07/2019   MCV 91 06/07/2019   PLT 271 06/07/2019      Component Value Date/Time   NA 144 06/07/2019 1519   K 5.2 06/07/2019 1519   CL 100 06/07/2019 1519   CO2 27 06/07/2019 1519   GLUCOSE 95 06/07/2019 1519   GLUCOSE 104 (H) 03/07/2018 1138   BUN 15 06/07/2019 1519   CREATININE 0.70 06/07/2019 1519   CALCIUM 10.0 06/07/2019 1519   PROT 6.8 04/14/2021 1346   ALBUMIN 4.9 06/07/2019 1519   AST 19 06/07/2019 1519   ALT 13  06/07/2019 1519   ALKPHOS 101 06/07/2019 1519   BILITOT 0.2 06/07/2019 1519   GFRNONAA 94 06/07/2019 1519   GFRAA 109 06/07/2019 1519      ASSESSMENT AND PLAN  Multiple sclerosis (H[facility])  Dysesthesia  Vertigo  Other fatigue  Raynaud's phenomenon without gangrene  Urinary dysfunction  Neck pain   1.   She remains off a disease modifying therapy.  She will Continue IV Solu-Medrol every 6-8 weeks as needed 2.   Continue Adderall  for  her fatigue and attention deficit.  New prescription sent in 3.   Clonazepam nightly for spasticity and insomnia.  Tramadol for pain. 4.   Ondansetron one po prn daily for nausea (I sent to the [employer] to save her money) 5.    Add duloxetine (d/c the Lexapro) ad take an aspiin daily for dysesthesia that could be erythromelalgia.    We could consider a calcium channel blocker if no better in a month.  6.   Bilateral splenius capitus muscles and C3C4 paraspinal muscle trigger point injections with 80 mg Depo-Medrol in [place] using sterile technique.  She tolerated the injextion well and pain was better after a few minutes   7.   She will return to see me in 6 months or sooner if she has new  or worsening neurologic symptoms.    40-minute office visit with the majority of the time spent face-to-face for history and physical, discussion/counseling and decision-making.  Additional time with record review and documentation.   Tajh Livsey A. Felecia Shelling, MD, PhD 42/37/0230, 1:72 PM Certified in Neurology, Clinical Neurophysiology, Sleep Medicine, Pain Medicine and Neuroimaging  [facility] [address] [place], [place] [address] [phone]

## 2022-05-27 ENCOUNTER — Other Ambulatory Visit: Payer: Self-pay | Admitting: Neurology

## 2022-06-03 ENCOUNTER — Encounter: Payer: Self-pay | Admitting: Neurology

## 2022-06-03 ENCOUNTER — Other Ambulatory Visit: Payer: Self-pay | Admitting: *Deleted

## 2022-06-03 MED ORDER — AMPHETAMINE-DEXTROAMPHETAMINE 20 MG PO TABS: 20.0000 mg | ORAL_TABLET | Freq: Two times a day (BID) | ORAL | 0 refills | Status: DC

## 2022-06-03 NOTE — Telephone Encounter (Signed)
Called [facility] at [phone]. Cx rx Adderall sent in on 05/14/22.

## 2022-06-05 ENCOUNTER — Other Ambulatory Visit: Payer: Self-pay | Admitting: Neurology

## 2022-07-01 ENCOUNTER — Other Ambulatory Visit: Payer: Self-pay | Admitting: Neurology

## 2022-07-02 ENCOUNTER — Other Ambulatory Visit: Payer: Self-pay | Admitting: Neurology

## 2022-07-02 NOTE — Telephone Encounter (Signed)
Follow up visit scheduled on 11/19/22.  Rx sent.

## 2022-07-08 ENCOUNTER — Other Ambulatory Visit: Payer: Self-pay | Admitting: Neurology

## 2022-07-09 MED ORDER — AMPHETAMINE-DEXTROAMPHETAMINE 20 MG PO TABS: 20.0000 mg | ORAL_TABLET | Freq: Two times a day (BID) | ORAL | 0 refills | Status: DC

## 2022-07-15 ENCOUNTER — Encounter: Payer: Self-pay | Admitting: Neurology

## 2022-07-16 ENCOUNTER — Other Ambulatory Visit (INDEPENDENT_AMBULATORY_CARE_PROVIDER_SITE_OTHER): Payer: Medicare Other

## 2022-07-16 ENCOUNTER — Other Ambulatory Visit: Payer: Self-pay | Admitting: *Deleted

## 2022-07-16 DIAGNOSIS — N390 Urinary tract infection, site not specified: Secondary | ICD-10-CM

## 2022-07-16 DIAGNOSIS — Z0289 Encounter for other administrative examinations: Secondary | ICD-10-CM

## 2022-07-16 DIAGNOSIS — G35 Multiple sclerosis: Secondary | ICD-10-CM | POA: Diagnosis not present

## 2022-07-16 NOTE — Telephone Encounter (Signed)
Called pt. She will come before 3pm for IV solumedrol 1G. Order provided to [facility]. Placed orders for UA/culture. Pt aware to complete when she comes as well.

## 2022-07-16 NOTE — Telephone Encounter (Signed)
Per [facility], pt last received 1G IV solumedrol 03/23/22

## 2022-07-18 LAB — URINALYSIS
Bilirubin, UA: NEGATIVE
Glucose, UA: NEGATIVE
Ketones, UA: NEGATIVE
Leukocytes,UA: NEGATIVE
Nitrite, UA: NEGATIVE
RBC, UA: NEGATIVE
Specific Gravity, UA: >=1.03 — AB (ref 1.005–1.030)
Urobilinogen, Ur: 0.2 mg/dL (ref 0.2–1.0)
pH, UA: 6.5 (ref 5.0–7.5)

## 2022-07-18 LAB — URINE CULTURE

## 2022-08-03 ENCOUNTER — Other Ambulatory Visit: Payer: Self-pay | Admitting: *Deleted

## 2022-08-03 ENCOUNTER — Encounter: Payer: Self-pay | Admitting: Neurology

## 2022-08-03 MED ORDER — IMIPRAMINE HCL 25 MG PO TABS: 25.0000 mg | ORAL_TABLET | Freq: Every day | ORAL | 5 refills | Status: DC

## 2022-08-06 DIAGNOSIS — L821 Other seborrheic keratosis: Secondary | ICD-10-CM | POA: Diagnosis not present

## 2022-08-06 DIAGNOSIS — Z85828 Personal history of other malignant neoplasm of skin: Secondary | ICD-10-CM | POA: Diagnosis not present

## 2022-08-06 DIAGNOSIS — L570 Actinic keratosis: Secondary | ICD-10-CM | POA: Diagnosis not present

## 2022-08-06 DIAGNOSIS — L710 Perioral dermatitis: Secondary | ICD-10-CM | POA: Diagnosis not present

## 2022-08-10 ENCOUNTER — Encounter: Payer: Self-pay | Admitting: Neurology

## 2022-08-11 ENCOUNTER — Other Ambulatory Visit: Payer: Self-pay | Admitting: *Deleted

## 2022-08-11 ENCOUNTER — Encounter: Payer: Self-pay | Admitting: *Deleted

## 2022-08-17 DIAGNOSIS — G35 Multiple sclerosis: Secondary | ICD-10-CM | POA: Diagnosis not present

## 2022-08-17 DIAGNOSIS — Z993 Dependence on wheelchair: Secondary | ICD-10-CM | POA: Diagnosis not present

## 2022-08-17 DIAGNOSIS — F331 Major depressive disorder, recurrent, moderate: Secondary | ICD-10-CM | POA: Diagnosis not present

## 2022-08-17 DIAGNOSIS — E7800 Pure hypercholesterolemia, unspecified: Secondary | ICD-10-CM | POA: Diagnosis not present

## 2022-08-17 DIAGNOSIS — Z0000 Encounter for general adult medical examination without abnormal findings: Secondary | ICD-10-CM | POA: Diagnosis not present

## 2022-08-17 DIAGNOSIS — G909 Disorder of the autonomic nervous system, unspecified: Secondary | ICD-10-CM | POA: Diagnosis not present

## 2022-08-19 ENCOUNTER — Other Ambulatory Visit: Payer: Self-pay | Admitting: Family Medicine

## 2022-08-19 DIAGNOSIS — Z1231 Encounter for screening mammogram for malignant neoplasm of breast: Secondary | ICD-10-CM

## 2022-08-24 ENCOUNTER — Other Ambulatory Visit: Payer: Self-pay | Admitting: Neurology

## 2022-08-24 ENCOUNTER — Encounter: Payer: Self-pay | Admitting: Neurology

## 2022-08-30 ENCOUNTER — Other Ambulatory Visit: Payer: Self-pay | Admitting: Neurology

## 2022-08-31 MED ORDER — AMPHETAMINE-DEXTROAMPHETAMINE 20 MG PO TABS: 20.0000 mg | ORAL_TABLET | Freq: Two times a day (BID) | ORAL | 0 refills | Status: DC

## 2022-08-31 NOTE — Telephone Encounter (Signed)
Last seen on 05/14/2022 Follow up scheduled on 11/19/22 Last filed on 07/09/22 #60 tablet  Rx pending to be signed

## 2022-09-14 ENCOUNTER — Other Ambulatory Visit: Payer: Self-pay | Admitting: Neurology

## 2022-09-23 ENCOUNTER — Encounter: Payer: Self-pay | Admitting: Neurology

## 2022-09-24 ENCOUNTER — Ambulatory Visit (INDEPENDENT_AMBULATORY_CARE_PROVIDER_SITE_OTHER): Payer: Medicare Other | Admitting: Neurology

## 2022-09-24 ENCOUNTER — Encounter: Payer: Self-pay | Admitting: Neurology

## 2022-09-24 VITALS — BP 111/69 | HR 78

## 2022-09-24 DIAGNOSIS — M5431 Sciatica, right side: Secondary | ICD-10-CM | POA: Diagnosis not present

## 2022-09-24 DIAGNOSIS — R39198 Other difficulties with micturition: Secondary | ICD-10-CM | POA: Diagnosis not present

## 2022-09-24 DIAGNOSIS — M542 Cervicalgia: Secondary | ICD-10-CM | POA: Diagnosis not present

## 2022-09-24 DIAGNOSIS — G35 Multiple sclerosis: Secondary | ICD-10-CM

## 2022-09-24 DIAGNOSIS — G35D Multiple sclerosis, unspecified: Secondary | ICD-10-CM

## 2022-09-24 NOTE — Progress Notes (Signed)
[facility]  PATIENT: Tonya Powers DOB: [birth year]  REFERRING CLINICIAN: Mayra Neer is PCP HISTORY FROM: Patient   REASON FOR VISIT: MS and poor gait   HISTORICAL  CHIEF COMPLAINT:  Chief Complaint  Patient presents with   Room 2    Pt is here Alone. Pt states that her neck is hurting her so bad. Pt states that her hip is hurting her extremely bad.      HISTORY OF PRESENT ILLNESS:  Tonya Powers is a 62 y.o. woman with an active form of secondary progressive MS.      Update 09/24/2022: She is off any DMT for MS (has non-relapsing SPMS).   She has not had any exacerbations but does note mild worsening of strength, gait and spasticity.  She is having much more HA/occipital pain.  In the past, TPI's have helped for a few months.  Pain returned last month and is more severe now.    Also having more severe right hip pain.  In the past, bursa injection has helped x many months and she would like a repeat injection  She has burning dysesthesias in her feet and abdomen associated with numbness,.  Wearing socks or pants is uncomfortable and she usually just wears a nightgown.   She is is on gabapentin, lamotrigine and tramadol.   Lidocaine ointment had not helped.  Sometimes she uses ice with some benefit.   Many years ago she was on amitriptyline but it made  her very sleepy.   Feet are very red, especially in the morning.  Unclear if erythromelalgia.   She has not tried an ASA.  Feet tend to be hot when she wakes up and cold as the day goes on.     She has a little more swelling in her feet. Feet feel hot and are red but are usually cold to the touch.  They are numb.  She saw [facility] and had studies.    Arteries were fine.    Venous doppler showed slow flow in the superficial vein.    Pentoxifylline was poorly tolerated.  Nifedipine had not helped.    She is seeing Orthopedics (Dr. Onnie Graham) and may need to have a shoulder replacement.  After a second opinion, she opted  to not do it.     She has MS related ADD and fatigue, helped by Adderall 20 mg po bid.  When mor etired, we sometimes a day of IV Solumedrol.        MS History:  She presented with optic neuritis in 1991 followed shortly by difficulties with leg weakness and by right trigeminal neuralgia. In retrospect, couple years earlier when she had her daughter, has some difficulties with her legs and needed to go on short-term disability. At first she was not diagnosed with MS but after more of the symptoms she underwent MRI testing and had a lumbar puncture by Dr. Johnnye Sima. The imaging and the CSF was consistent with multiple sclerosis. When Betaseron became available she was placed on that. She was on Betaseron for about 10 years. She felt that she did not have too many exacerbations during that time but she had a lot of difficulty tolerating the Betaseron due to skin reactions. Around 12-15 years ago, she started to use a cane and she has had progressive gait disturbance over the last decade. Around the house for short distances, she uses a walker but uses her scooter for longer distances. Outside she uses a wheelchair pushed by  others   She tried Ocrevus in 2018 but felt worse so stopped in 2020.  She has done Solu-Medrol  .  MRI Brain 02/28/2016 showed T2/FLAIR hyperintense foci in the periventricular, juxtacortical and deep white matter of both hemispheres and in the left cerebellar hemisphere in a pattern and configuration consistent with chronic demyelinating plaque associated with multiple sclerosis. None of the foci enhances. Compared to the MRI from 04/04/2015, there is no interval change.  MRI Cervical spine showed  multiple lesions within the spinal cord adjacent to C2-C3, C3, C4-C5, C5-C6 and C7-T1. When compared to the MRI dated 04/04/2015, these lesions appear to be chronic.  REVIEW OF SYSTEMS:  Constitutional: No fevers, chills, sweats, or change in appetite.  She has Fatigue Eyes: No visual  changes, double vision, eye pain Ear, nose and throat: No hearing loss, ear pain, nasal congestion, sore throat Cardiovascular: No chest pain, palpitations Respiratory:  No shortness of breath at rest or with exertion.   No wheezes GastrointestinaI: Mild dysphagia.  Constipation.  No nausea, vomiting, diarrhea.   Genitourinary:  No dysuria but has urinary retention and frequency.  Desmopressin helps nocturia. Musculoskeletal:  No neck pain,   Has lower back pain and buttocks.  Hips ans other joints ok Integumentary: No rash, pruritus, skin lesions Neurological: as above Psychiatric: see above Endocrine: No palpitations, diaphoresis, change in appetite, change in weigh or increased thirst Hematologic/Lymphatic:  No anemia, purpura, petechiae. Allergic/Immunologic: No itchy/runny eyes, nasal congestion, recent allergic reactions, rashes  ALLERGIES: Allergies  Allergen Reactions   Codeine Nausea And Vomiting   Imipramine     Vertigo    HOME MEDICATIONS: Outpatient Medications Prior to Visit  Medication Sig Dispense Refill   acetaminophen (TYLENOL) 500 MG tablet Take 1,000 mg by mouth daily as needed for pain.     amphetamine-dextroamphetamine (ADDERALL) 20 MG tablet Take 1 tablet (20 mg total) by mouth 2 (two) times daily. 60 tablet 0   diazepam (VALIUM) 5 MG tablet Take 1 tablet (5 mg total) by mouth every 8 (eight) hours as needed. 90 tablet 5   DULoxetine (CYMBALTA) 60 MG capsule TAKE 1 CAPSULE(60 MG) BY MOUTH DAILY 30 capsule 2   gabapentin (NEURONTIN) 800 MG tablet TAKE 1 TABLET(800 MG) BY MOUTH FOUR TIMES DAILY 120 tablet 11   lamoTRIgine (LAMICTAL) 200 MG tablet TAKE 1 TABLET BY MOUTH THREE TIMES DAILY 270 tablet 2   lidocaine (LIDODERM) 5 % Place 1 patch daily onto the skin. Remove & Discard patch within 12 hours or as directed by MD 30 patch 11   meclizine (ANTIVERT) 25 MG tablet TAKE 1 TABLET(25 MG) BY MOUTH THREE TIMES DAILY AS NEEDED FOR DIZZINESS 30 tablet 2   omeprazole  (PRILOSEC) 40 MG capsule Take 40 mg by mouth 2 (two) times daily.     promethazine (PHENERGAN) 25 MG tablet Take 1 tablet (25 mg total) by mouth every 6 (six) hours as needed for nausea (nausea). 30 tablet 3   rOPINIRole (REQUIP) 0.5 MG tablet TAKE 1 TO 2 TABLETS(0.5 TO 1 MG) BY MOUTH AT BEDTIME 60 tablet 5   traMADol (ULTRAM) 50 MG tablet TAKE 1 TABLET BY MOUTH FOUR TIMES DAILY AS NEEDED FOR MODERATE PAIN 120 tablet 5   zolpidem (AMBIEN) 10 MG tablet TAKE 1 TABLET(5 MG) BY MOUTH AT BEDTIME AS NEEDED FOR SLEEP 30 tablet 5   No facility-administered medications prior to visit.    PAST MEDICAL HISTORY: Past Medical History:  Diagnosis Date   Anxiety  Depression    Gait instability    uses wheelchair or assistance   H/O steroid therapy    IV infusion every 6 to 8 weeks- last 05-31-14 [facility]   History of breast biopsy    Multiple sclerosis ([place])    Neuromuscular disorder ([place])    MS   PONV (postoperative nausea and vomiting)    Skin cancer     PAST SURGICAL HISTORY: Past Surgical History:  Procedure Laterality Date   ABDOMINAL HYSTERECTOMY     BREAST BIOPSY Right 09/20/2012   Procedure: BREAST BIOPSY WITH NEEDLE LOCALIZATION;  Surgeon: Rolm Bookbinder, MD;  Location: [place];  Service: General;  Laterality: Right;   BREAST EXCISIONAL BIOPSY Right    benign   CESAREAN SECTION     COLONOSCOPY WITH PROPOFOL N/A 07/13/2014   Procedure: COLONOSCOPY WITH PROPOFOL;  Surgeon: Beryle Beams, MD;  Location: [facility];  Service: Endoscopy;  Laterality: N/A;   COLONOSCOPY WITH PROPOFOL N/A 12/13/2020   Procedure: COLONOSCOPY WITH PROPOFOL;  Surgeon: Carol Ada, MD;  Location: [facility];  Service: Endoscopy;  Laterality: N/A;   DEBRIDEMENT SKIN / SQ / MUSCLE OF ARM Right 07/13/2003   I & D; debridement of tissue within triceps muscle   ESOPHAGOGASTRODUODENOSCOPY (EGD) WITH PROPOFOL N/A 12/13/2020   Procedure: ESOPHAGOGASTRODUODENOSCOPY (EGD) WITH  PROPOFOL;  Surgeon: Carol Ada, MD;  Location: [facility];  Service: Endoscopy;  Laterality: N/A;   LEG SURGERY     sx as a child   MELANOMA EXCISION     PARTIAL HYSTERECTOMY     TONSILLECTOMY     as a child    FAMILY HISTORY: Family History  Problem Relation Age of Onset   Hyperlipidemia Mother    Hypertension Mother    Allergic rhinitis Father    Hypertension Father    Diabetes type II Father    Allergic rhinitis Sister     SOCIAL HISTORY:  Social History   Socioeconomic History   Marital status: Married    Spouse name: Not on file   Number of children: Not on file   Years of education: Not on file   Highest education level: Not on file  Occupational History   Not on file  Tobacco Use   Smoking status: Never    Passive exposure: Never   Smokeless tobacco: Never  Vaping Use   Vaping Use: Never used  Substance and Sexual Activity   Alcohol use: Not Currently    Comment: social   Drug use: No   Sexual activity: Not on file  Other Topics Concern   Not on file  Social History Narrative   Lives at home w/ her husband   Social Determinants of Health   Financial Resource Strain: Not on file  Food Insecurity: Not on file  Transportation Needs: Not on file  Physical Activity: Not on file  Stress: Not on file  Social Connections: Not on file  Intimate Partner Violence: Not on file     PHYSICAL EXAM  Vitals:   09/24/22 1149  BP: 111/69  Pulse: 78     There is no height or weight on file to calculate BMI.   General: The patient is well-developed and well-nourished and in no acute distress   Skin/Ext/Musculoskeletal: There is pedal edema..  Her feet are cold.  Her neck is tender over bilateral occiput regions and mid to lower cervical paraspinal muscles.  Lifting her head reduces the pain.   Also tender over right piriformis muscle (trochanteric  bursa just slightly tender)  Neurologic Exam  Mental status: The patient is alert and oriented x 3 at  the time of the examination. The patient has apparent normal recent and remote memory, with an apparently normal attention span and concentration ability.   Speech is normal.  Cranial nerves: Extraocular movements are full.  Facial strength and sensation was normal.  Trapezius strength is normal.. No dysarthria is noted.  No obvious hearing deficits are noted.  Motor:  Muscle bulk and tone are normal.  Strength is 2+/5 left and 3/5 right proximal leg strength.  Strength is 4- to 4/5 distally.  The arms are fairly strong.  Sensory: She has normal sensation to touch and vibration in the arms but reduced touch sensation in the left leg relative to the right  Coordination: Cerebellar testing reveals reduced finger-nose-finger and poor heel-to-shin bilaterally.  Gait and station: She needs support to stand.  She is unable to stand without bilateral support.  Reflexes: Deep tendon reflexes are symmetric and increased in legs bilaterally with spread at the knees.  She has nonsustained ankle clonus bilaterally.    DIAGNOSTIC DATA (LABS, IMAGING, TESTING) - I reviewed patient records, labs, notes, testing and imaging myself where available.  Lab Results  Component Value Date   WBC 8.5 06/07/2019   HGB 13.9 06/07/2019   HCT 40.8 06/07/2019   MCV 91 06/07/2019   PLT 271 06/07/2019      Component Value Date/Time   NA 144 06/07/2019 1519   K 5.2 06/07/2019 1519   CL 100 06/07/2019 1519   CO2 27 06/07/2019 1519   GLUCOSE 95 06/07/2019 1519   GLUCOSE 104 (H) 03/07/2018 1138   BUN 15 06/07/2019 1519   CREATININE 0.70 06/07/2019 1519   CALCIUM 10.0 06/07/2019 1519   PROT 6.8 04/14/2021 1346   ALBUMIN 4.9 06/07/2019 1519   AST 19 06/07/2019 1519   ALT 13 06/07/2019 1519   ALKPHOS 101 06/07/2019 1519   BILITOT 0.2 06/07/2019 1519   GFRNONAA 94 06/07/2019 1519   GFRAA 109 06/07/2019 1519      ASSESSMENT AND PLAN  Multiple sclerosis (H[facility])  Neck pain  Right sided sciatica  Urinary  dysfunction   1.   She remains off a disease modifying therapy.  She will Continue IV Solu-Medrol every 6-8 weeks as needed 2.   Continue Adderall  for  her fatigue and attention deficit.  New prescription sent in 3.   Clonazepam nightly for spasticity and insomnia.  Tramadol for pain. 4.    Bilateral splenius capitus muscles and C6C7 paraspinal muscle and right piriformis muscle trigger point injections with 80 mg Depo-Medrol in [place] using sterile technique.  She tolerated the injection well and pain was better after a few minutes   7.   She will return to see me in 6 months or sooner if she has new or worsening neurologic symptoms.     Mry Lamia A. Felecia Shelling, MD, PhD [id number], [id number] PM Certified in Neurology, Clinical Neurophysiology, Sleep Medicine, Pain Medicine and Neuroimaging  [facility] [address] [place], [place] [address] [phone]

## 2022-10-02 ENCOUNTER — Ambulatory Visit: Payer: Medicare Other

## 2022-10-07 ENCOUNTER — Ambulatory Visit
Admission: RE | Admit: 2022-10-07 | Discharge: 2022-10-07 | Disposition: A | Discharge status: Home / Self Care | Payer: Medicare Other | Source: Ambulatory Visit | Attending: Family Medicine | Admitting: Family Medicine

## 2022-10-07 DIAGNOSIS — Z1231 Encounter for screening mammogram for malignant neoplasm of breast: Secondary | ICD-10-CM

## 2022-10-10 ENCOUNTER — Other Ambulatory Visit: Payer: Self-pay | Admitting: Neurology

## 2022-10-12 MED ORDER — AMPHETAMINE-DEXTROAMPHETAMINE 20 MG PO TABS: 20.0000 mg | ORAL_TABLET | Freq: Two times a day (BID) | ORAL | 0 refills | Status: DC

## 2022-10-20 ENCOUNTER — Encounter: Payer: Self-pay | Admitting: Neurology

## 2022-10-21 ENCOUNTER — Telehealth (INDEPENDENT_AMBULATORY_CARE_PROVIDER_SITE_OTHER): Payer: Medicare Other | Admitting: Neurology

## 2022-10-21 ENCOUNTER — Encounter: Payer: Self-pay | Admitting: Neurology

## 2022-10-21 ENCOUNTER — Other Ambulatory Visit: Payer: Self-pay | Admitting: Neurology

## 2022-10-21 ENCOUNTER — Other Ambulatory Visit: Payer: Self-pay

## 2022-10-21 DIAGNOSIS — I7300 Raynaud's syndrome without gangrene: Secondary | ICD-10-CM

## 2022-10-21 DIAGNOSIS — R5383 Other fatigue: Secondary | ICD-10-CM

## 2022-10-21 DIAGNOSIS — R39198 Other difficulties with micturition: Secondary | ICD-10-CM | POA: Diagnosis not present

## 2022-10-21 DIAGNOSIS — M542 Cervicalgia: Secondary | ICD-10-CM

## 2022-10-21 DIAGNOSIS — M5431 Sciatica, right side: Secondary | ICD-10-CM | POA: Diagnosis not present

## 2022-10-21 DIAGNOSIS — G35 Multiple sclerosis: Secondary | ICD-10-CM | POA: Diagnosis not present

## 2022-10-21 MED ORDER — HYDROCHLOROTHIAZIDE 12.5 MG PO CAPS: 12.5000 mg | ORAL_CAPSULE | Freq: Every morning | ORAL | 5 refills | Status: AC

## 2022-10-21 NOTE — Progress Notes (Signed)
[facility]  PATIENT: Tonya Powers DOB: May 10, 1961  REFERRING CLINICIAN: Lupita Raider is PCP HISTORY FROM: Patient   REASON FOR VISIT: MS and poor gait   HISTORICAL  CHIEF COMPLAINT:  Chief Complaint  Patient presents with   Multiple Sclerosis    HISTORY OF PRESENT ILLNESS:  Tonya Powers is a 62 y.o. woman with an active form of secondary progressive MS.      Virtual Visit via Video Note I connected with Tonya Powers on 10/21/22 at  2:00 PM EDT by a video enabled telemedicine application and verified that I am speaking with the correct person.  I discussed the limitations of evaluation and management by telemedicine and the availability of in person appointments. The patient expressed understanding and agreed to proceed.  Patient was in her home.  Provider was in the office.  Update 10/21/2022: She came in for a TPI and trochanteric bursa injection about a month ago when she was experiencing occipital headaches and hip pain.  Both the headache and the hip pain improved after the injections.    She is off any DMT for MS (has non-relapsing SPMS).   She has not had any exacerbations but does note mild worsening of strength, gait and spasticity.  She has burning dysesthesias in her feet and abdomen. There is a more aching pain up to the knees.   Wearing socks or pants is uncomfortable and she usually just wears a nightgown.   She is is on gabapentin, lamotrigine and tramadol.   Lidocaine ointment had not helped.  Sometimes she uses ice with some benefit.   Many years ago she was on amitriptyline but it made  her very sleepy.   Feet are very red, especially in the morning.  Unclear if erythromelalgia.   She has not tried an ASA.  Feet tend to be hot when she wakes up and cold as the day goes on.   She has not tried a diuretic.     She has some vertigo still but is much better.    She did better after starting Lexapro.    She has near daily nausea and is taking  meclizine with benefit.   Its worse when she wakes up.   Zofran may he helped in past but was expensive.   She has swelling in her feet. Feet feel hot and are red but are usually cold to the touch.  They are numb.  She saw [facility] and had studies.    Arteries were fine.    Venous doppler showed slow flow in the superficial vein.    Pentoxifylline was poorly tolerated.  Nifedipine had not helped.    She is seeing Orthopedics (Dr. Rennis Chris) and may need to have a shoulder replacement.   She and the orthopedist are concerned about recovery due to her weakness and her need to use both arms to transfer.   She has MS related ADD and fatigue, helped by Adderall 20 mg po bid.  When more tired, we sometimes a day of IV Solumedrol.     PE: She is a well-developed well-nourished woman in no acute distress.  The head is normocephalic and atraumatic.  Sclera are anicteric.  Visible skin appears normal.  The neck has a good range of motion. She is alert and fully oriented with fluent speech and good attention, knowledge and memory.  Extraocular muscles are intact.  Facial strength is normal.        MS History:  She  presented with optic neuritis in 1991 followed shortly by difficulties with leg weakness and by right trigeminal neuralgia. In retrospect, couple years earlier when she had her daughter, has some difficulties with her legs and needed to go on short-term disability. At first she was not diagnosed with MS but after more of the symptoms she underwent MRI testing and had a lumbar puncture by Dr. Ninetta Lights. The imaging and the CSF was consistent with multiple sclerosis. When Betaseron became available she was placed on that. She was on Betaseron for about 10 years. She felt that she did not have too many exacerbations during that time but she had a lot of difficulty tolerating the Betaseron due to skin reactions. Around 12-15 years ago, she started to use a cane and she has had progressive gait disturbance over the last  decade. Around the house for short distances, she uses a walker but uses her scooter for longer distances. Outside she uses a wheelchair pushed by others   She tried Ocrevus in 2018 but felt worse so stopped in 2020.  She has done Solu-Medrol  .  MRI Brain 02/28/2016 showed T2/FLAIR hyperintense foci in the periventricular, juxtacortical and deep white matter of both hemispheres and in the left cerebellar hemisphere in a pattern and configuration consistent with chronic demyelinating plaque associated with multiple sclerosis. None of the foci enhances. Compared to the MRI from 04/04/2015, there is no interval change.  MRI Cervical spine showed  multiple lesions within the spinal cord adjacent to C2-C3, C3, C4-C5, C5-C6 and C7-T1. When compared to the MRI dated 04/04/2015, these lesions appear to be chronic.  REVIEW OF SYSTEMS:  Constitutional: No fevers, chills, sweats, or change in appetite.  She has Fatigue Eyes: No visual changes, double vision, eye pain Ear, nose and throat: No hearing loss, ear pain, nasal congestion, sore throat Cardiovascular: No chest pain, palpitations Respiratory:  No shortness of breath at rest or with exertion.   No wheezes GastrointestinaI: Mild dysphagia.  Constipation.  No nausea, vomiting, diarrhea.   Genitourinary:  No dysuria but has urinary retention and frequency.  Desmopressin helps nocturia. Musculoskeletal:  No neck pain,   Has lower back pain and buttocks.  Hips ans other joints ok Integumentary: No rash, pruritus, skin lesions Neurological: as above Psychiatric: see above Endocrine: No palpitations, diaphoresis, change in appetite, change in weigh or increased thirst Hematologic/Lymphatic:  No anemia, purpura, petechiae. Allergic/Immunologic: No itchy/runny eyes, nasal congestion, recent allergic reactions, rashes  ALLERGIES: Allergies  Allergen Reactions   Codeine Nausea And Vomiting   Imipramine     Vertigo    HOME MEDICATIONS: Outpatient  Medications Prior to Visit  Medication Sig Dispense Refill   acetaminophen (TYLENOL) 500 MG tablet Take 1,000 mg by mouth daily as needed for pain.     amphetamine-dextroamphetamine (ADDERALL) 20 MG tablet Take 1 tablet (20 mg total) by mouth 2 (two) times daily. 60 tablet 0   diazepam (VALIUM) 5 MG tablet Take 1 tablet (5 mg total) by mouth every 8 (eight) hours as needed. 90 tablet 5   DULoxetine (CYMBALTA) 60 MG capsule TAKE 1 CAPSULE(60 MG) BY MOUTH DAILY 30 capsule 2   gabapentin (NEURONTIN) 800 MG tablet TAKE 1 TABLET(800 MG) BY MOUTH FOUR TIMES DAILY 120 tablet 11   lamoTRIgine (LAMICTAL) 200 MG tablet TAKE 1 TABLET BY MOUTH THREE TIMES DAILY 270 tablet 2   lidocaine (LIDODERM) 5 % Place 1 patch daily onto the skin. Remove & Discard patch within 12 hours or as directed  by MD 30 patch 11   meclizine (ANTIVERT) 25 MG tablet TAKE 1 TABLET(25 MG) BY MOUTH THREE TIMES DAILY AS NEEDED FOR DIZZINESS 30 tablet 2   omeprazole (PRILOSEC) 40 MG capsule Take 40 mg by mouth 2 (two) times daily.     promethazine (PHENERGAN) 25 MG tablet Take 1 tablet (25 mg total) by mouth every 6 (six) hours as needed for nausea (nausea). 30 tablet 3   rOPINIRole (REQUIP) 0.5 MG tablet TAKE 1 TO 2 TABLETS(0.5 TO 1 MG) BY MOUTH AT BEDTIME 60 tablet 5   traMADol (ULTRAM) 50 MG tablet TAKE 1 TABLET BY MOUTH FOUR TIMES DAILY AS NEEDED FOR MODERATE PAIN 120 tablet 5   zolpidem (AMBIEN) 10 MG tablet TAKE 1 TABLET(5 MG) BY MOUTH AT BEDTIME AS NEEDED FOR SLEEP 30 tablet 5   No facility-administered medications prior to visit.    PAST MEDICAL HISTORY: Past Medical History:  Diagnosis Date   Anxiety    Depression    Gait instability    uses wheelchair or assistance   H/O steroid therapy    IV infusion every 6 to 8 weeks- last 05-31-14 [facility]   History of breast biopsy    Multiple sclerosis    Neuromuscular disorder    MS   PONV (postoperative nausea and vomiting)    Skin cancer     PAST SURGICAL  HISTORY: Past Surgical History:  Procedure Laterality Date   ABDOMINAL HYSTERECTOMY     BREAST BIOPSY Right 09/20/2012   Procedure: BREAST BIOPSY WITH NEEDLE LOCALIZATION;  Surgeon: Emelia Loron, MD;  Location: [facility];  Service: General;  Laterality: Right;   BREAST EXCISIONAL BIOPSY Right    benign   CESAREAN SECTION     COLONOSCOPY WITH PROPOFOL N/A 07/13/2014   Procedure: COLONOSCOPY WITH PROPOFOL;  Surgeon: Theda Belfast, MD;  Location: [facility];  Service: Endoscopy;  Laterality: N/A;   COLONOSCOPY WITH PROPOFOL N/A 12/13/2020   Procedure: COLONOSCOPY WITH PROPOFOL;  Surgeon: Jeani Hawking, MD;  Location: [facility];  Service: Endoscopy;  Laterality: N/A;   DEBRIDEMENT SKIN / SQ / MUSCLE OF ARM Right 07/13/2003   I & D; debridement of tissue within triceps muscle   ESOPHAGOGASTRODUODENOSCOPY (EGD) WITH PROPOFOL N/A 12/13/2020   Procedure: ESOPHAGOGASTRODUODENOSCOPY (EGD) WITH PROPOFOL;  Surgeon: Jeani Hawking, MD;  Location: [facility];  Service: Endoscopy;  Laterality: N/A;   LEG SURGERY     sx as a child   MELANOMA EXCISION     PARTIAL HYSTERECTOMY     TONSILLECTOMY     as a child    FAMILY HISTORY: Family History  Problem Relation Age of Onset   Hyperlipidemia Mother    Hypertension Mother    Allergic rhinitis Father    Hypertension Father    Diabetes type II Father    Allergic rhinitis Sister     SOCIAL HISTORY:  Social History   Socioeconomic History   Marital status: Married    Spouse name: Not on file   Number of children: Not on file   Years of education: Not on file   Highest education level: Not on file  Occupational History   Not on file  Tobacco Use   Smoking status: Never    Passive exposure: Never   Smokeless tobacco: Never  Vaping Use   Vaping Use: Never used  Substance and Sexual Activity   Alcohol use: Not Currently    Comment: social   Drug use: No   Sexual activity: Not  on file  Other Topics Concern   Not  on file  Social History Narrative   Lives at home w/ her husband   Social Determinants of Health   Financial Resource Strain: Not on file  Food Insecurity: Not on file  Transportation Needs: Not on file  Physical Activity: Not on file  Stress: Not on file  Social Connections: Not on file  Intimate Partner Violence: Not on file     PHYSICAL EXAM from 09/24/2022  There were no vitals filed for this visit.   There is no height or weight on file to calculate BMI.   General: The patient is well-developed and well-nourished and in no acute distress   Skin/Ext/Musculoskeletal: There is pedal edema..  Her feet are cold.  Neck is tender over bilateral occiput regions.  Lifting her head reduces the pain.     Neurologic Exam  Mental status: The patient is alert and oriented x 3 at the time of the examination. The patient has apparent normal recent and remote memory, with an apparently normal attention span and concentration ability.   Speech is normal.  Cranial nerves: Extraocular movements are full.  Facial strength and sensation was normal.  Trapezius strength is normal.. No dysarthria is noted.  No obvious hearing deficits are noted.  Motor:  Muscle bulk and tone are normal.  Strength is 2+/5 left and 3/5 right proximal leg strength.  Strength is 4- to 4/5 distally.  The arms are fairly strong.  Sensory: She has normal sensation to touch and vibration in the arms but reduced touch sensation in the left leg relative to the right  Coordination: Cerebellar testing reveals reduced finger-nose-finger and poor heel-to-shin bilaterally.  Gait and station: She needs support to stand.  She is unable to stand without bilateral support.  Reflexes: Deep tendon reflexes are symmetric and increased in legs bilaterally with spread at the knees.  She has nonsustained ankle clonus bilaterally.    DIAGNOSTIC DATA (LABS, IMAGING, TESTING) - I reviewed patient records, labs, notes, testing and  imaging myself where available.  Lab Results  Component Value Date   WBC 8.5 06/07/2019   HGB 13.9 06/07/2019   HCT 40.8 06/07/2019   MCV 91 06/07/2019   PLT 271 06/07/2019      Component Value Date/Time   NA 144 06/07/2019 1519   K 5.2 06/07/2019 1519   CL 100 06/07/2019 1519   CO2 27 06/07/2019 1519   GLUCOSE 95 06/07/2019 1519   GLUCOSE 104 (H) 03/07/2018 1138   BUN 15 06/07/2019 1519   CREATININE 0.70 06/07/2019 1519   CALCIUM 10.0 06/07/2019 1519   PROT 6.8 04/14/2021 1346   ALBUMIN 4.9 06/07/2019 1519   AST 19 06/07/2019 1519   ALT 13 06/07/2019 1519   ALKPHOS 101 06/07/2019 1519   BILITOT 0.2 06/07/2019 1519   GFRNONAA 94 06/07/2019 1519   GFRAA 109 06/07/2019 1519      ASSESSMENT AND PLAN  Multiple sclerosis  Right sided sciatica  Urinary dysfunction  Neck pain  Raynaud's phenomenon without gangrene  Other fatigue   1.   She remains off a disease modifying therapy.  She will Continue IV Solu-Medrol one gram every 2-3 months - would like to have one soon and she will contact the infusion room 2.   Continue Adderall  for  her fatigue and attention deficit.   She will take 20 mg every morning and an additional 20 mg in the afternoon as needed 3.   Clonazepam nightly  for spasticity and insomnia.  Tramadol for pain. 4.   Ondansetron one po prn daily for nausea.  5.    continue duloxetine for mood and dysesthesia that could be erythromelalgia.    We will add a diuretic 6. She will return to see me in 6 months or sooner if she has new or worsening neurologic symptoms.    Follow Up Instructions: I discussed the assessment and treatment plan with the patient. The patient was provided an opportunity to ask questions and all were answered. The patient agreed with the plan and demonstrated an understanding of the instructions.    The patient was advised to call back or seek an in-person evaluation if the symptoms worsen or if the condition fails to improve as  anticipated.  I provided 25 minutes of non-face-to-face time during this encounter.  Hye Trawick A. Epimenio Foot, MD, PhD 10/21/2022, 2:31 PM Certified in Neurology, Clinical Neurophysiology, Sleep Medicine, Pain Medicine and Neuroimaging  [facility] [address] [place], [place] [address] [phone]

## 2022-10-22 ENCOUNTER — Telehealth: Payer: Self-pay | Admitting: Neurology

## 2022-10-22 NOTE — Telephone Encounter (Signed)
Follow up scheduled with Dr. Epimenio Foot for 04/27/23 at 11:30am

## 2022-10-22 NOTE — Telephone Encounter (Signed)
Called Dr. Epimenio Foot since he is out of office. He wanted to order: 1G IV solumedrol x1 day. Provided signed order by Dr. Terrace Arabia (she is work in [place] this am) to intrafusion. Infusion suite will call pt to get her scheduled.

## 2022-10-26 DIAGNOSIS — G35 Multiple sclerosis: Secondary | ICD-10-CM | POA: Diagnosis not present

## 2022-11-04 ENCOUNTER — Encounter: Payer: Self-pay | Admitting: Neurology

## 2022-11-04 ENCOUNTER — Other Ambulatory Visit: Payer: Self-pay | Admitting: Neurology

## 2022-11-05 NOTE — Telephone Encounter (Signed)
Printed letter. Placed on Dr. Zannie Cove desk to sign since Dr. Epimenio Foot out.

## 2022-11-11 ENCOUNTER — Encounter: Payer: Self-pay | Admitting: *Deleted

## 2022-11-12 ENCOUNTER — Other Ambulatory Visit: Payer: Self-pay | Admitting: Neurology

## 2022-11-15 ENCOUNTER — Encounter: Payer: Self-pay | Admitting: Neurology

## 2022-11-16 NOTE — Telephone Encounter (Signed)
Last seen 10/21/22 Video visit  Follow up scheduled on 04/27/23 Last filled on 10/16/22 #30 tablets(30 day supply) Rx pending to be signed

## 2022-11-19 ENCOUNTER — Ambulatory Visit: Payer: Medicare Other | Admitting: Neurology

## 2022-11-20 ENCOUNTER — Other Ambulatory Visit: Payer: Self-pay | Admitting: Neurology

## 2022-11-24 MED ORDER — AMPHETAMINE-DEXTROAMPHETAMINE 20 MG PO TABS: 20.0000 mg | ORAL_TABLET | Freq: Two times a day (BID) | ORAL | 0 refills | Status: DC

## 2022-11-24 NOTE — Telephone Encounter (Signed)
Pt last seen on 10/21/22 Follow up scheduled on 04/27/23 Last filled on 10/12/22 #60 tablets (30 day supply) Rx pending to be signed

## 2022-12-06 ENCOUNTER — Other Ambulatory Visit: Payer: Self-pay | Admitting: Neurology

## 2022-12-07 NOTE — Telephone Encounter (Signed)
Last seen on 10/21/22 Follow up scheduled on 04/27/23 Last filled on 09/10/22 #90 tablets (30 day supply) Rx pending to be signed

## 2022-12-24 ENCOUNTER — Other Ambulatory Visit: Payer: Self-pay | Admitting: Neurology

## 2022-12-24 ENCOUNTER — Other Ambulatory Visit: Payer: Self-pay

## 2023-01-01 ENCOUNTER — Other Ambulatory Visit: Payer: Self-pay | Admitting: Neurology

## 2023-01-04 MED ORDER — AMPHETAMINE-DEXTROAMPHETAMINE 20 MG PO TABS: 20.0000 mg | ORAL_TABLET | Freq: Two times a day (BID) | ORAL | 0 refills | Status: DC

## 2023-01-04 NOTE — Telephone Encounter (Signed)
Patient last seen:10/21/22 Follow up scheduled: 04/27/23 Last filled:11/24/22  #60 tablets (30 day supply) Rx pending to be signed

## 2023-01-05 DIAGNOSIS — K080 Exfoliation of teeth due to systemic causes: Secondary | ICD-10-CM | POA: Diagnosis not present

## 2023-01-16 ENCOUNTER — Other Ambulatory Visit: Payer: Self-pay | Admitting: Neurology

## 2023-01-18 NOTE — Telephone Encounter (Signed)
Last seen on 10/22/22 per note " continue duloxetine for mood and dysesthesia that could be erythromelalgia. " Follow up scheduled on 04/27/23

## 2023-01-19 ENCOUNTER — Encounter: Payer: Self-pay | Admitting: Neurology

## 2023-01-19 MED ORDER — ONDANSETRON HCL 4 MG PO TABS: 4.0000 mg | ORAL_TABLET | Freq: Three times a day (TID) | ORAL | 3 refills | Status: DC | PRN

## 2023-01-20 DIAGNOSIS — G35 Multiple sclerosis: Secondary | ICD-10-CM | POA: Diagnosis not present

## 2023-02-01 DIAGNOSIS — K080 Exfoliation of teeth due to systemic causes: Secondary | ICD-10-CM | POA: Diagnosis not present

## 2023-02-03 DIAGNOSIS — M791 Myalgia, unspecified site: Secondary | ICD-10-CM | POA: Diagnosis not present

## 2023-02-03 DIAGNOSIS — M19012 Primary osteoarthritis, left shoulder: Secondary | ICD-10-CM | POA: Diagnosis not present

## 2023-02-13 ENCOUNTER — Other Ambulatory Visit: Payer: Self-pay | Admitting: Neurology

## 2023-02-14 ENCOUNTER — Other Ambulatory Visit: Payer: Self-pay | Admitting: Neurology

## 2023-02-16 NOTE — Telephone Encounter (Signed)
Last seen on 10/21/22 Follow up scheduled on 04/27/23 Last filled on 10/25/22 #360 tablets (90 day supply) Rx pending to be signed

## 2023-02-17 NOTE — Telephone Encounter (Signed)
Last seen on 10/21/22 Follow up scheduled on 04/27/23 Last filled on 02/10/23 #120 tablets (30 day supply)

## 2023-02-20 ENCOUNTER — Other Ambulatory Visit: Payer: Self-pay | Admitting: Neurology

## 2023-02-22 MED ORDER — AMPHETAMINE-DEXTROAMPHETAMINE 20 MG PO TABS: 20.0000 mg | ORAL_TABLET | Freq: Two times a day (BID) | ORAL | 0 refills | Status: DC

## 2023-02-22 NOTE — Telephone Encounter (Signed)
Last seen on 10/21/22 Follow up scheduled 04/27/23 Last filled on  01/04/23 #60 tablets (30 day supply) Rx pending to be signed

## 2023-03-07 ENCOUNTER — Encounter: Payer: Self-pay | Admitting: Neurology

## 2023-03-08 DIAGNOSIS — G35 Multiple sclerosis: Secondary | ICD-10-CM | POA: Diagnosis not present

## 2023-04-12 ENCOUNTER — Other Ambulatory Visit: Payer: Self-pay | Admitting: Neurology

## 2023-04-13 MED ORDER — AMPHETAMINE-DEXTROAMPHETAMINE 20 MG PO TABS: 20.0000 mg | ORAL_TABLET | Freq: Two times a day (BID) | ORAL | 0 refills | Status: DC

## 2023-04-13 NOTE — Telephone Encounter (Signed)
Last seen on 10/21/22 Follow up scheduled on 04/27/23 Last filled on 02/22/23 #60 tablets (30 day supply) Rx pending to be signed

## 2023-04-14 ENCOUNTER — Other Ambulatory Visit: Payer: Self-pay | Admitting: Neurology

## 2023-04-14 NOTE — Telephone Encounter (Signed)
Last seen on 10/21/22 Follow up scheduled on 04/27/23

## 2023-04-27 ENCOUNTER — Ambulatory Visit (INDEPENDENT_AMBULATORY_CARE_PROVIDER_SITE_OTHER): Payer: Medicare Other | Admitting: Neurology

## 2023-04-27 ENCOUNTER — Encounter: Payer: Self-pay | Admitting: Neurology

## 2023-04-27 VITALS — BP 112/62 | HR 75

## 2023-04-27 DIAGNOSIS — M5431 Sciatica, right side: Secondary | ICD-10-CM | POA: Diagnosis not present

## 2023-04-27 DIAGNOSIS — R39198 Other difficulties with micturition: Secondary | ICD-10-CM | POA: Diagnosis not present

## 2023-04-27 DIAGNOSIS — R5383 Other fatigue: Secondary | ICD-10-CM | POA: Diagnosis not present

## 2023-04-27 DIAGNOSIS — I7381 Erythromelalgia: Secondary | ICD-10-CM

## 2023-04-27 DIAGNOSIS — G35 Multiple sclerosis: Secondary | ICD-10-CM

## 2023-04-27 DIAGNOSIS — R208 Other disturbances of skin sensation: Secondary | ICD-10-CM

## 2023-04-27 MED ORDER — DULOXETINE HCL 60 MG PO CPEP: ORAL_CAPSULE | ORAL | 11 refills | Status: DC

## 2023-04-27 NOTE — Progress Notes (Signed)
[facility]  PATIENT: Tonya Powers DOB: 05-Oct-1960  REFERRING CLINICIAN: Lupita Raider is PCP HISTORY FROM: Patient   REASON FOR VISIT: MS and poor gait   HISTORICAL  CHIEF COMPLAINT:  Chief Complaint  Patient presents with   Room 10    Pt is here Alone. Pt states that she had a couple of falls recently. Pt states that she has been constipated a lot lately. Pt states that her depression has increased lately. Pt states that she is sleeping more lately. Pt states that she is wanting a script for toilet lift and a floor lift for her house due to falls.     HISTORY OF PRESENT ILLNESS:  Tonya Powers is a 62 y.o. woman with an active form of secondary progressive MS.      Update 04/27/2023: She is off any DMT for MS (has non-relapsing SPMS).   She has not had any exacerbations but does note mild worsening of strength, gait and spasticity.      She has had more falls and now needs to have someone with her 24/7 since transfer is more difficult.    She has hand controls in her [employer] and feels she drives well but getting in/out is difficult  She has burning dysesthesias in her feet and abdomen.  Wearing socks or pants is uncomfortable and she usually just wears a nightgown.   She is is on gabapentin, lamotrigine and tramadol.   Lidocaine ointment had not helped.  Sometimes she uses ice with some benefit.   Many years ago she was on amitriptyline but it made  her very sleepy.   Feet are very red, especially in the morning.  Unclear if erythromelalgia.   She has not tried an ASA.  Feet tend to be hot when she wakes up and cold as the day goes on.     She has some vertigo still but is much better.    She did better after starting Lexapro.    She has near daily nausea and is taking meclizine with benefit.   Its worse when she wakes up.   Zofran may he helped in past but was expensive.    She often wakes up  is having more nausea and HA.    She had to cut her beach vacation  short due to feeling sick.   She has more urinary burning and frequency and feels urine is cloudy.      She has swelling in her feet. Feet feel hot and are red but are usually cold to the touch.  They are numb.  She saw [facility] and had studies.    Arteries were fine.    Venous doppler showed slow flow in the superficial vein.    Pentoxifylline was poorly tolerated.  Nifedipine had not helped.    She is seeing Orthopedics (Dr. Rennis Chris) and may need to have a shoulder replacement.   She and the orthopedist are concerned about recovery due to her weakness f need to use both hands to transfer.   She has MS related ADD and fatigue, helped by Adderall 20 mg po bid.  When mor etired, we sometimes a day of IV Solumedrol.        MS History:  She presented with optic neuritis in 1991 followed shortly by difficulties with leg weakness and by right trigeminal neuralgia. In retrospect, couple years earlier when she had her daughter, has some difficulties with her legs and needed to go on short-term disability.  At first she was not diagnosed with MS but after more of the symptoms she underwent MRI testing and had a lumbar puncture by Dr. Ninetta Lights. The imaging and the CSF was consistent with multiple sclerosis. When Betaseron became available she was placed on that. She was on Betaseron for about 10 years. She felt that she did not have too many exacerbations during that time but she had a lot of difficulty tolerating the Betaseron due to skin reactions. Around 12-15 years ago, she started to use a cane and she has had progressive gait disturbance over the last decade. Around the house for short distances, she uses a walker but uses her scooter for longer distances. Outside she uses a wheelchair pushed by others   She tried Ocrevus in 2018 but felt worse so stopped in 2020.  She has done Solu-Medrol  .  MRI Brain 02/28/2016 showed T2/FLAIR hyperintense foci in the periventricular, juxtacortical and deep white matter of both  hemispheres and in the left cerebellar hemisphere in a pattern and configuration consistent with chronic demyelinating plaque associated with multiple sclerosis. None of the foci enhances. Compared to the MRI from 04/04/2015, there is no interval change.  MRI Cervical spine showed  multiple lesions within the spinal cord adjacent to C2-C3, C3, C4-C5, C5-C6 and C7-T1. When compared to the MRI dated 04/04/2015, these lesions appear to be chronic.  REVIEW OF SYSTEMS:  Constitutional: No fevers, chills, sweats, or change in appetite.  She has Fatigue Eyes: No visual changes, double vision, eye pain Ear, nose and throat: No hearing loss, ear pain, nasal congestion, sore throat Cardiovascular: No chest pain, palpitations Respiratory:  No shortness of breath at rest or with exertion.   No wheezes GastrointestinaI: Mild dysphagia.  Constipation.  No nausea, vomiting, diarrhea.   Genitourinary:  No dysuria but has urinary retention and frequency.  Desmopressin helps nocturia. Musculoskeletal:  No neck pain,   Has lower back pain and buttocks.  Hips ans other joints ok Integumentary: No rash, pruritus, skin lesions Neurological: as above Psychiatric: see above Endocrine: No palpitations, diaphoresis, change in appetite, change in weigh or increased thirst Hematologic/Lymphatic:  No anemia, purpura, petechiae. Allergic/Immunologic: No itchy/runny eyes, nasal congestion, recent allergic reactions, rashes  ALLERGIES: Allergies  Allergen Reactions   Codeine Nausea And Vomiting   Imipramine     Vertigo    HOME MEDICATIONS: Outpatient Medications Prior to Visit  Medication Sig Dispense Refill   acetaminophen (TYLENOL) 500 MG tablet Take 1,000 mg by mouth daily as needed for pain.     amphetamine-dextroamphetamine (ADDERALL) 20 MG tablet Take 1 tablet (20 mg total) by mouth 2 (two) times daily. 60 tablet 0   diazepam (VALIUM) 5 MG tablet TAKE 1 TABLET(5 MG) BY MOUTH EVERY 8 HOURS AS NEEDED 90 tablet  5   DULoxetine (CYMBALTA) 60 MG capsule TAKE 1 CAPSULE(60 MG) BY MOUTH DAILY 30 capsule 0   gabapentin (NEURONTIN) 800 MG tablet TAKE 1 TABLET(800 MG) BY MOUTH FOUR TIMES DAILY 120 tablet 0   lamoTRIgine (LAMICTAL) 200 MG tablet TAKE 1 TABLET BY MOUTH THREE TIMES DAILY 270 tablet 2   lidocaine (LIDODERM) 5 % Place 1 patch daily onto the skin. Remove & Discard patch within 12 hours or as directed by MD 30 patch 11   meclizine (ANTIVERT) 25 MG tablet TAKE 1 TABLET(25 MG) BY MOUTH THREE TIMES DAILY AS NEEDED FOR DIZZINESS 30 tablet 2   omeprazole (PRILOSEC) 40 MG capsule Take 40 mg by mouth 2 (two) times daily.  ondansetron (ZOFRAN) 4 MG tablet Take 1 tablet (4 mg total) by mouth every 8 (eight) hours as needed for nausea or vomiting. 90 tablet 3   promethazine (PHENERGAN) 25 MG tablet Take 1 tablet (25 mg total) by mouth every 6 (six) hours as needed for nausea (nausea). 30 tablet 3   rOPINIRole (REQUIP) 0.5 MG tablet TAKE 1 TO 2 TABLETS(0.5 TO 1 MG) BY MOUTH AT BEDTIME 60 tablet 5   traMADol (ULTRAM) 50 MG tablet TAKE 1 TABLET BY MOUTH FOUR TIMES DAILY AS NEEDED FOR MODERATE PAIN 120 tablet 5   zolpidem (AMBIEN) 10 MG tablet TAKE 1 TABLET BY MOUTH AT BEDTIME AS NEEDED FOR SLEEP 30 tablet 5   hydrochlorothiazide (MICROZIDE) 12.5 MG capsule Take 1 capsule (12.5 mg total) by mouth in the morning. (Patient not taking: Reported on 04/27/2023) 30 capsule 5   No facility-administered medications prior to visit.    PAST MEDICAL HISTORY: Past Medical History:  Diagnosis Date   Anxiety    Depression    Gait instability    uses wheelchair or assistance   H/O steroid therapy    IV infusion every 6 to 8 weeks- last 05-31-14 [facility]   History of breast biopsy    Multiple sclerosis (HCC)    Neuromuscular disorder (HCC)    MS   PONV (postoperative nausea and vomiting)    Skin cancer     PAST SURGICAL HISTORY: Past Surgical History:  Procedure Laterality Date   ABDOMINAL  HYSTERECTOMY     BREAST BIOPSY Right 09/20/2012   Procedure: BREAST BIOPSY WITH NEEDLE LOCALIZATION;  Surgeon: Emelia Loron, MD;  Location: [facility];  Service: General;  Laterality: Right;   BREAST EXCISIONAL BIOPSY Right    benign   CESAREAN SECTION     COLONOSCOPY WITH PROPOFOL N/A 07/13/2014   Procedure: COLONOSCOPY WITH PROPOFOL;  Surgeon: Theda Belfast, MD;  Location: [facility];  Service: Endoscopy;  Laterality: N/A;   COLONOSCOPY WITH PROPOFOL N/A 12/13/2020   Procedure: COLONOSCOPY WITH PROPOFOL;  Surgeon: Jeani Hawking, MD;  Location: [facility];  Service: Endoscopy;  Laterality: N/A;   DEBRIDEMENT SKIN / SQ / MUSCLE OF ARM Right 07/13/2003   I & D; debridement of tissue within triceps muscle   ESOPHAGOGASTRODUODENOSCOPY (EGD) WITH PROPOFOL N/A 12/13/2020   Procedure: ESOPHAGOGASTRODUODENOSCOPY (EGD) WITH PROPOFOL;  Surgeon: Jeani Hawking, MD;  Location: [facility];  Service: Endoscopy;  Laterality: N/A;   LEG SURGERY     sx as a child   MELANOMA EXCISION     PARTIAL HYSTERECTOMY     TONSILLECTOMY     as a child    FAMILY HISTORY: Family History  Problem Relation Age of Onset   Hyperlipidemia Mother    Hypertension Mother    Allergic rhinitis Father    Hypertension Father    Diabetes type II Father    Allergic rhinitis Sister     SOCIAL HISTORY:  Social History   Socioeconomic History   Marital status: Married    Spouse name: Not on file   Number of children: Not on file   Years of education: Not on file   Highest education level: Not on file  Occupational History   Not on file  Tobacco Use   Smoking status: Never    Passive exposure: Never   Smokeless tobacco: Never  Vaping Use   Vaping status: Never Used  Substance and Sexual Activity   Alcohol use: Not Currently    Comment: social  Drug use: No   Sexual activity: Not on file  Other Topics Concern   Not on file  Social History Narrative   Lives at home w/ her husband    Social Determinants of Health   Financial Resource Strain: Not on file  Food Insecurity: Not on file  Transportation Needs: Not on file  Physical Activity: Not on file  Stress: Not on file  Social Connections: Not on file  Intimate Partner Violence: Not on file     PHYSICAL EXAM  Vitals:   04/27/23 1137  BP: 112/62  Pulse: 75     There is no height or weight on file to calculate BMI.   General: The patient is well-developed and well-nourished and in no acute distress   Skin/Ext/Musculoskeletal: There is pedal edema..  Her feet are cold.  Neck is tender over bilateral occiput regions.  Lifting her head reduces the pain.     Neurologic Exam  Mental status: The patient is alert and oriented x 3 at the time of the examination. The patient has apparent normal recent and remote memory, with an apparently normal attention span and concentration ability.   Speech is normal.  Cranial nerves: Extraocular movements are full.  Facial strength and sensation was normal.  Trapezius strength is normal.. No dysarthria is noted.  No obvious hearing deficits are noted.  Motor:  Muscle bulk and tone are normal.  Strength is 2+/5 left and 3/5 right proximal leg strength.  Strength is 4- to 4/5 distally.  The arms are fairly strong.  Sensory: She has normal sensation to touch and vibration in the arms but reduced touch sensation in the left leg relative to the right  Coordination: Cerebellar testing reveals reduced finger-nose-finger and poor heel-to-shin bilaterally.  Gait and station: She needs support to stand.  She needs bilateral support to stand up. Reflexes: Deep tendon reflexes are symmetric and increased in legs bilaterally with spread at the knees.  She has nonsustained ankle clonus bilaterally.    DIAGNOSTIC DATA (LABS, IMAGING, TESTING) - I reviewed patient records, labs, notes, testing and imaging myself where available.  Lab Results  Component Value Date   WBC 8.5  06/07/2019   HGB 13.9 06/07/2019   HCT 40.8 06/07/2019   MCV 91 06/07/2019   PLT 271 06/07/2019      Component Value Date/Time   NA 144 06/07/2019 1519   K 5.2 06/07/2019 1519   CL 100 06/07/2019 1519   CO2 27 06/07/2019 1519   GLUCOSE 95 06/07/2019 1519   GLUCOSE 104 (H) 03/07/2018 1138   BUN 15 06/07/2019 1519   CREATININE 0.70 06/07/2019 1519   CALCIUM 10.0 06/07/2019 1519   PROT 6.8 04/14/2021 1346   ALBUMIN 4.9 06/07/2019 1519   AST 19 06/07/2019 1519   ALT 13 06/07/2019 1519   ALKPHOS 101 06/07/2019 1519   BILITOT 0.2 06/07/2019 1519   GFRNONAA 94 06/07/2019 1519   GFRAA 109 06/07/2019 1519      ASSESSMENT AND PLAN  Multiple sclerosis (HCC)  Right sided sciatica  Urinary dysfunction  Other fatigue  Dysesthesia  Erythromelalgia (HCC)   1.   She remains off a disease modifying therapy.  She will Continue IV Solu-Medrol every 6-8 weeks as needed 2.   Continue Adderall  for  her fatigue and attention deficit.  New prescription sent in 3.   Clonazepam nightly for spasticity and insomnia.  Tramadol for pain. 4.   Ondansetron one po prn daily for nausea   5.  Increase duloxetine to bid 6.   Electric toilet lift and electric floor lift 7.   Compounded ointment/cream 5% lidocaine, 2% amitriptyline, 1% ketamine   100 g 5 refill 8.   She will return to see me in 6 months or sooner if she has new or worsening neurologic symptoms.    40-minute office visit with the majority of the time spent face-to-face for history and physical, discussion/counseling and decision-making.  Additional time with record review and documentation.  This visit is part of a comprehensive longitudinal care medical relationship regarding the patients primary diagnosis of MS and related concerns.   Lakeya Mulka A. Epimenio Foot, MD, PhD 04/27/2023, 12:00 PM Certified in Neurology, Clinical Neurophysiology, Sleep Medicine, Pain Medicine and Neuroimaging  [facility] [address]  [address] [place], [place] [address] [phone]

## 2023-05-07 DIAGNOSIS — R69 Illness, unspecified: Secondary | ICD-10-CM | POA: Diagnosis not present

## 2023-05-12 ENCOUNTER — Other Ambulatory Visit: Payer: Self-pay | Admitting: Neurology

## 2023-05-13 ENCOUNTER — Other Ambulatory Visit: Payer: Self-pay

## 2023-05-13 ENCOUNTER — Other Ambulatory Visit: Payer: Self-pay | Admitting: Neurology

## 2023-05-13 MED ORDER — AMPHETAMINE-DEXTROAMPHETAMINE 20 MG PO TABS: 20.0000 mg | ORAL_TABLET | Freq: Two times a day (BID) | ORAL | 0 refills | Status: DC

## 2023-05-13 MED ORDER — ZOLPIDEM TARTRATE 10 MG PO TABS: ORAL_TABLET | ORAL | 5 refills | Status: DC

## 2023-05-13 NOTE — Telephone Encounter (Signed)
Last seen on 04/27/23 Follow up scheduled on 11/04/23 Last filled on 04/13/23 #30 tablets  Rx pending to be signed

## 2023-05-13 NOTE — Telephone Encounter (Signed)
Last Seen 04/27/2023 Upcoming Appointment 11/04/2023  Zolpidem 10 mg Last Filled 04/13/2023 Escript 05/13/2023

## 2023-05-13 NOTE — Telephone Encounter (Signed)
Last seen on 04/27/23 Follow up scheduled on 11/04/23 Last filled on 04/13/23 #60 tablets (30 day supply) Rx pending to be signed

## 2023-05-20 ENCOUNTER — Other Ambulatory Visit: Payer: Self-pay | Admitting: Neurology

## 2023-06-12 ENCOUNTER — Other Ambulatory Visit: Payer: Self-pay | Admitting: Neurology

## 2023-06-14 NOTE — Telephone Encounter (Signed)
Last seen on 04/27/23 Follow up scheduled on 11/04/23

## 2023-06-23 ENCOUNTER — Other Ambulatory Visit: Payer: Self-pay | Admitting: Neurology

## 2023-06-24 ENCOUNTER — Other Ambulatory Visit: Payer: Self-pay | Admitting: Neurology

## 2023-06-24 ENCOUNTER — Other Ambulatory Visit: Payer: Self-pay

## 2023-06-24 MED ORDER — AMPHETAMINE-DEXTROAMPHETAMINE 20 MG PO TABS: 20.0000 mg | ORAL_TABLET | Freq: Two times a day (BID) | ORAL | 0 refills | Status: DC

## 2023-06-24 NOTE — Telephone Encounter (Signed)
Pt Last Seen 04/27/2023 Upcoming Appointment 11/04/2023

## 2023-07-01 ENCOUNTER — Other Ambulatory Visit: Payer: Self-pay | Admitting: Neurology

## 2023-07-01 ENCOUNTER — Encounter: Payer: Self-pay | Admitting: Neurology

## 2023-07-01 NOTE — Telephone Encounter (Signed)
 Last seen on 04/27/23 Follow up scheduled on 11/04/23

## 2023-07-08 DIAGNOSIS — L03039 Cellulitis of unspecified toe: Secondary | ICD-10-CM | POA: Diagnosis not present

## 2023-07-28 ENCOUNTER — Other Ambulatory Visit: Payer: Self-pay | Admitting: Neurology

## 2023-07-28 MED ORDER — AMPHETAMINE-DEXTROAMPHETAMINE 20 MG PO TABS: 20.0000 mg | ORAL_TABLET | Freq: Two times a day (BID) | ORAL | 0 refills | Status: DC

## 2023-07-28 NOTE — Telephone Encounter (Signed)
Last seen 04/27/23 and next f/u 11/04/23. Last refilled 06/24/23 #60.

## 2023-07-28 NOTE — Telephone Encounter (Signed)
Pt is requesting a refill for amphetamine-dextroamphetamine (ADDERALL) 20 MG tablet.  Pharmacy: [facility] [phone]

## 2023-08-17 ENCOUNTER — Other Ambulatory Visit: Payer: Self-pay | Admitting: Family Medicine

## 2023-08-17 DIAGNOSIS — E7800 Pure hypercholesterolemia, unspecified: Secondary | ICD-10-CM | POA: Diagnosis not present

## 2023-08-17 DIAGNOSIS — Z0000 Encounter for general adult medical examination without abnormal findings: Secondary | ICD-10-CM | POA: Diagnosis not present

## 2023-08-17 DIAGNOSIS — M810 Age-related osteoporosis without current pathological fracture: Secondary | ICD-10-CM

## 2023-08-17 DIAGNOSIS — G909 Disorder of the autonomic nervous system, unspecified: Secondary | ICD-10-CM | POA: Diagnosis not present

## 2023-08-17 DIAGNOSIS — G4700 Insomnia, unspecified: Secondary | ICD-10-CM | POA: Diagnosis not present

## 2023-08-17 DIAGNOSIS — G35 Multiple sclerosis: Secondary | ICD-10-CM | POA: Diagnosis not present

## 2023-08-27 ENCOUNTER — Encounter: Payer: Self-pay | Admitting: *Deleted

## 2023-08-27 ENCOUNTER — Encounter: Payer: Self-pay | Admitting: Neurology

## 2023-09-03 DIAGNOSIS — M791 Myalgia, unspecified site: Secondary | ICD-10-CM | POA: Diagnosis not present

## 2023-09-03 DIAGNOSIS — M19012 Primary osteoarthritis, left shoulder: Secondary | ICD-10-CM | POA: Diagnosis not present

## 2023-09-06 ENCOUNTER — Other Ambulatory Visit: Payer: Self-pay | Admitting: Neurology

## 2023-09-06 DIAGNOSIS — G35 Multiple sclerosis: Secondary | ICD-10-CM | POA: Diagnosis not present

## 2023-09-06 DIAGNOSIS — F411 Generalized anxiety disorder: Secondary | ICD-10-CM | POA: Diagnosis not present

## 2023-09-07 MED ORDER — AMPHETAMINE-DEXTROAMPHETAMINE 20 MG PO TABS: 20.0000 mg | ORAL_TABLET | Freq: Two times a day (BID) | ORAL | 0 refills | Status: DC

## 2023-09-07 NOTE — Telephone Encounter (Signed)
 Last seen 04/27/23, next appt 11/04/23 Dispenses   Dispensed Days Supply Quantity Provider Pharmacy  D-AMPHETAMINE SALT COMBO 20MG  TABS 07/28/2023 30 60 each Sater, Pearletha Furl, MD [facility] #...  D-AMPHETAMINE SALT COMBO 20MG  TABS 06/24/2023 30 60 each Huston Foley, MD [facility] #.Marland KitchenMarland Kitchen

## 2023-09-21 ENCOUNTER — Other Ambulatory Visit: Payer: Self-pay | Admitting: Neurology

## 2023-09-21 NOTE — Telephone Encounter (Signed)
 Last seen on 04/27/23 Follow up scheduled on 11/04/23 Last filled on 07/28/23 #120 tablets (30 day supply) Rx pending to be signed

## 2023-09-23 IMAGING — MR MR SHOULDER*L* W/O CM
6 series · 37 of 40 positions shown · non-contrast
Comparison: Left shoulder radiographs 09/04/2021

CLINICAL DATA: Left shoulder pain radiating down arm, decreased
range of motion.

EXAM:
MRI OF THE LEFT SHOULDER WITHOUT CONTRAST
TECHNIQUE: Multiplanar, multisequence MR imaging of the shoulder was performed.
No intravenous contrast was administered.

[Series 3: T2 fat-sat · axial · 4.0mm · 0.55mm/px · z∈[-22,+97]mm · 7 of 26 slices shown (1 of 4)]
[im 1/26]
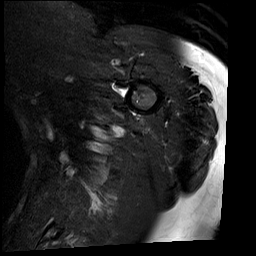
[im 5/26]
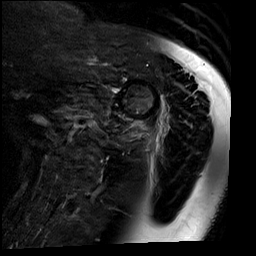
[im 9/26]
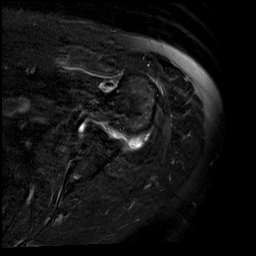
[im 13/26]
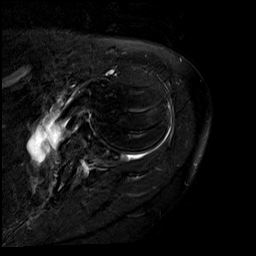
[im 17/26]
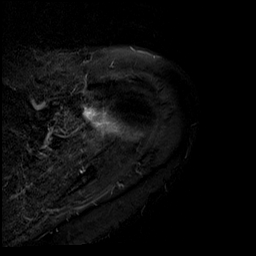
[im 21/26]
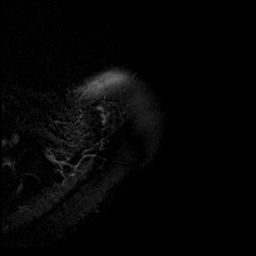
[im 26/26]
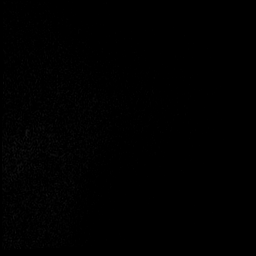

[Series 4: T2 fat-sat · oblique · 4.0mm · 0.55mm/px · 5 of 19 slices shown (2 of 4)]
[im 1/19]
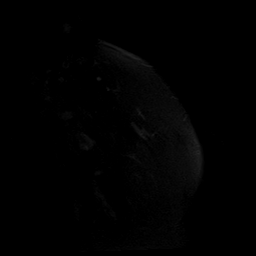
[im 5/19]
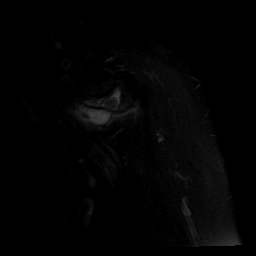
[im 10/19]
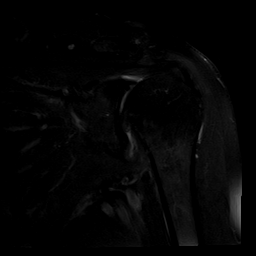
[im 14/19]
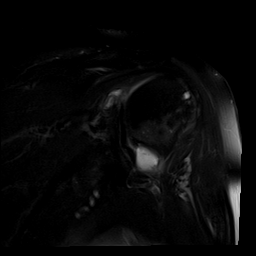
[im 19/19]
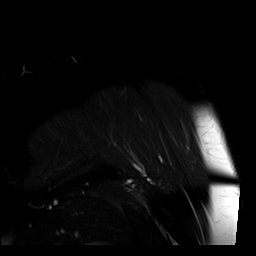

[Series 5: PD · oblique · 4.0mm · 0.27mm/px · 6 of 19 slices shown]
[im 1/19]
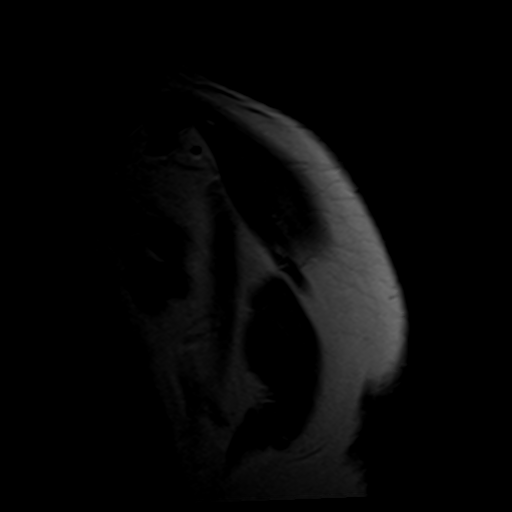
[im 4/19]
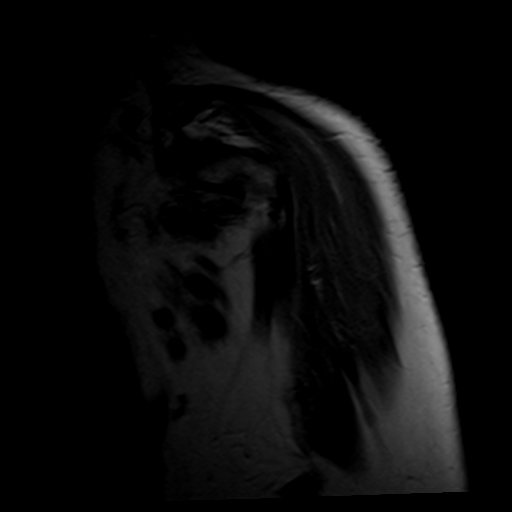
[im 8/19]
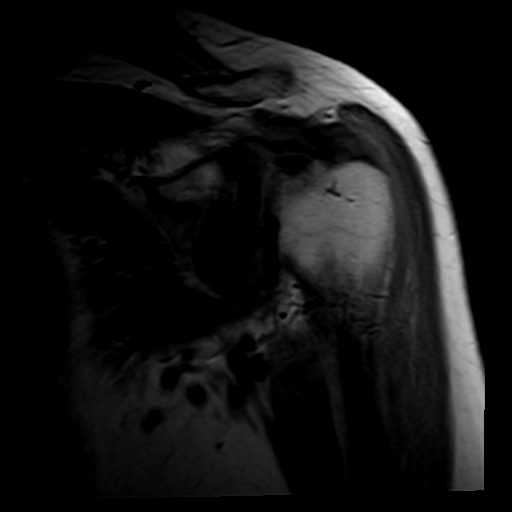
[im 11/19]
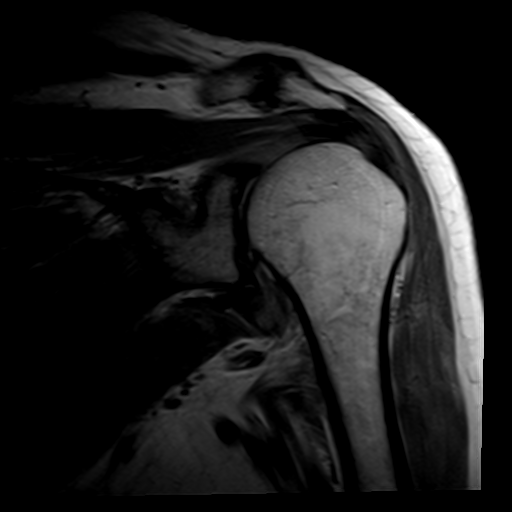
[im 15/19]
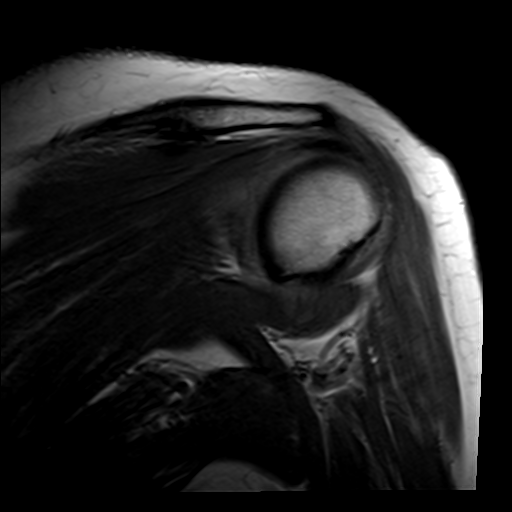
[im 19/19]
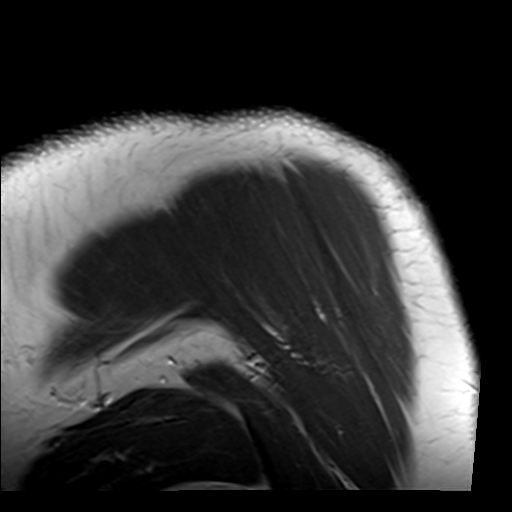

[Series 6: T2 fat-sat · oblique · 4.0mm · 0.55mm/px · 7 of 23 slices shown (3 of 4)]
[im 1/23]
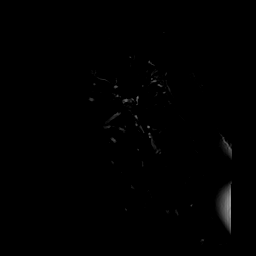
[im 4/23]
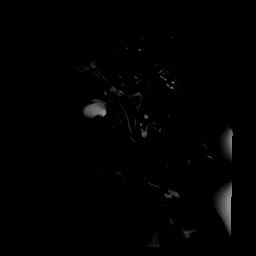
[im 8/23]
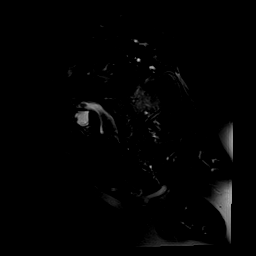
[im 12/23]
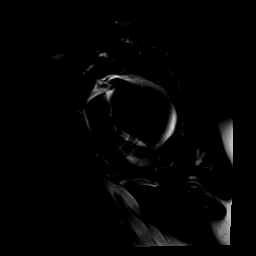
[im 15/23]
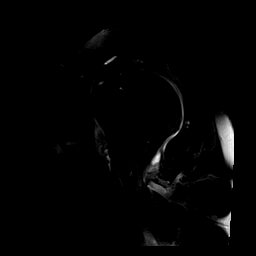
[im 19/23]
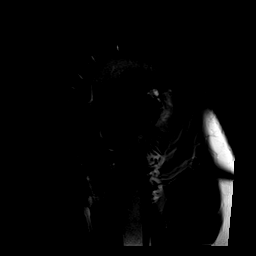
[im 23/23]
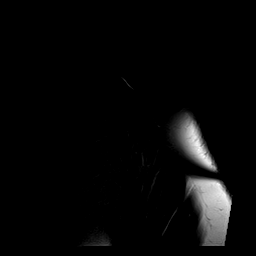

[Series 7: T1 · oblique · 4.0mm · 0.27mm/px · 4 of 23 slices shown]
[im 1/23]
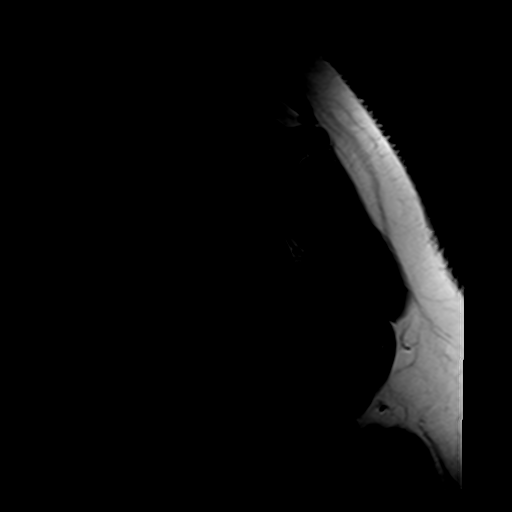
[im 4/23]
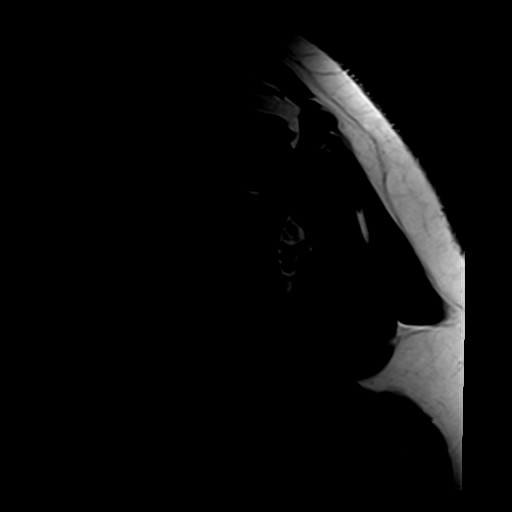
[im 8/23]
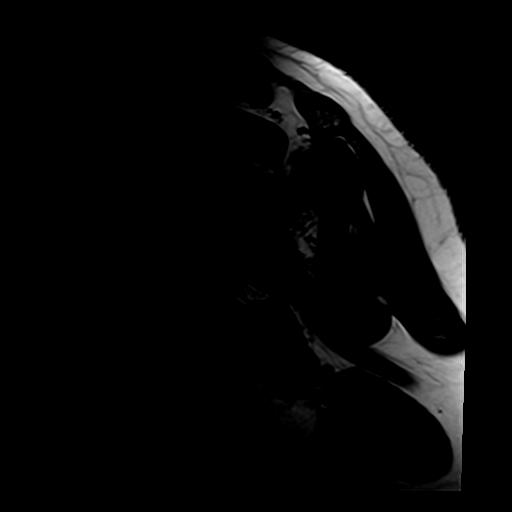
[im 12/23]
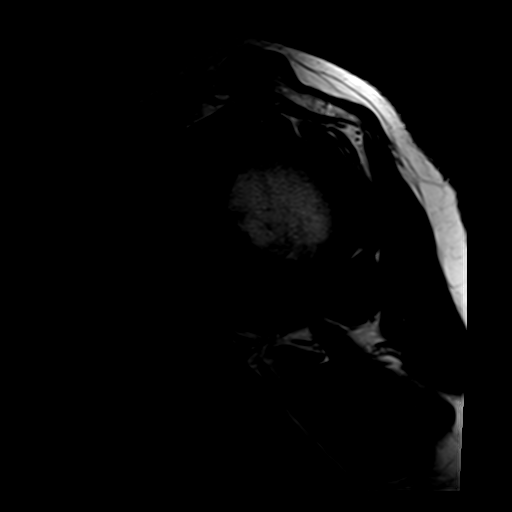

[Series 8: T2 fat-sat · axial · 4.0mm · 0.55mm/px · z∈[-23,+96]mm · 8 of 26 slices shown (4 of 4)]
[im 1/26]
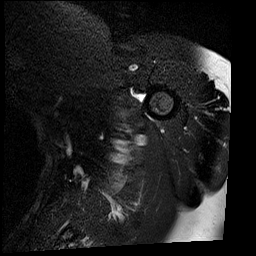
[im 4/26]
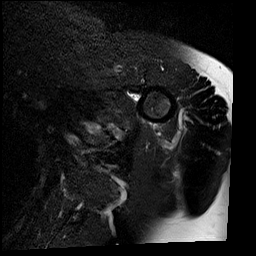
[im 8/26]
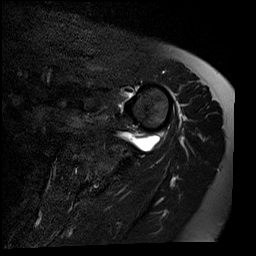
[im 11/26]
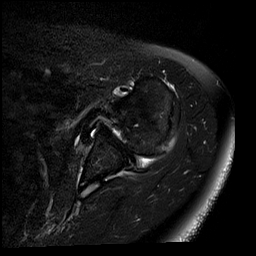
[im 15/26]
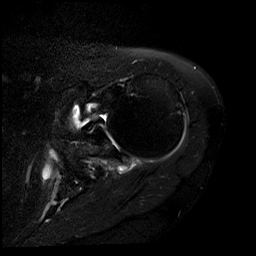
[im 18/26]
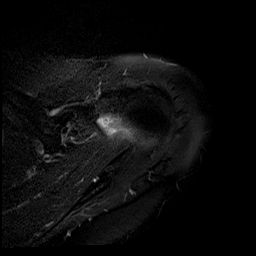
[im 22/26]
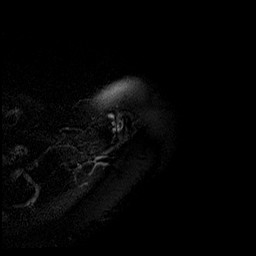
[im 26/26]
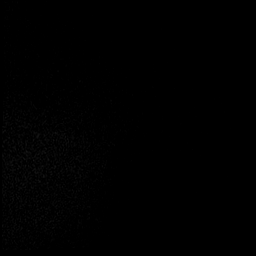

[37 of 40 positions shown; findings below may reference images not displayed]

FINDINGS: Rotator cuff: There is intermediate T2 signal tendinosis and mild
bursal sided degenerative partial-thickness tearing involving up to
approximately 30% of the transverse tendon thickness (coronal images
11 through 13) within the far anterior supraspinatus tendon
footprint and region measuring up to 9 mm in AP dimension (sagittal
image 18). There is mild cystic change seen at the deep aspect of
the anterior infraspinatus muscle body (sagittal image 10 and axial
image 11) that may represent extension of a paralabral cyst from a
posterosuperior glenoid labrum degenerative tear. The infraspinatus
tendon insertion is intact. There is mild subcortical cystic change
within the posterior humeral head deep to the infraspinatus tendon
insertion. The subscapularis and teres minor are intact.

Muscles:  Mild supraspinatus muscle atrophy.

Biceps long head: The intra-articular long head of the biceps tendon
is intact.

Acromioclavicular Joint: There are mild degenerative changes of the
acromioclavicular joint including joint space narrowing, subchondral
marrow edema, and peripheral osteophytosis. Type II acromion. No
subacromial/subdeltoid bursitis.

Glenohumeral Joint: Moderate to high-grade glenohumeral
osteoarthritis including multifocal full-thickness cartilage loss.
Moderate peripheral glenoid and inferior humeral head-neck junction
degenerative osteophytes.

Labrum: There is curvilinear increased T2 signal throughout the
posteroinferior through the posterosuperior glenoid labrum (axial
series 8 images 11 through 16). Small to moderate paralabral cyst
extending superiorly from the posterosuperior glenoid labrum (axial
images 11 through 13) deep to the anterior infraspinatus muscle body
(sagittal images 9 through 11).

Bones:  No acute fracture.

Other: None.
IMPRESSION: :
IMPRESSION: 1. Mild partial-thickness likely degenerative bursal sided tearing
of the far anterior supraspinatus tendon footprint. No tendon
retraction.
2. Mild supraspinatus muscle atrophy.
3. Mild degenerative changes of the acromioclavicular joint.
4. Moderate glenohumeral osteoarthritis.
5. Degenerative posterior glenoid labral tear. Likely paralabral
cyst extending from a degenerative tear within the posterosuperior
glenoid labrum, extending into the deep undersurface of the anterior
infraspinatus muscle body.

## 2023-10-03 ENCOUNTER — Other Ambulatory Visit: Payer: Self-pay | Admitting: Neurology

## 2023-10-04 NOTE — Telephone Encounter (Signed)
 Last filled on 1029/24 Follow up scheduled on 11/04/23 Last filled on 06/03/23 #90 tablets (30 day supply) Rx pending to be signed

## 2023-10-11 ENCOUNTER — Other Ambulatory Visit: Payer: Self-pay | Admitting: Neurology

## 2023-10-12 MED ORDER — AMPHETAMINE-DEXTROAMPHETAMINE 20 MG PO TABS: 20.0000 mg | ORAL_TABLET | Freq: Two times a day (BID) | ORAL | 0 refills | Status: DC

## 2023-10-12 NOTE — Telephone Encounter (Signed)
 Last seen 04/27/23 and next f/u 11/04/23. Last refilled 09/07/23 #60.

## 2023-10-28 ENCOUNTER — Encounter (INDEPENDENT_AMBULATORY_CARE_PROVIDER_SITE_OTHER): Payer: Self-pay | Admitting: Otolaryngology

## 2023-11-04 ENCOUNTER — Ambulatory Visit: Payer: Medicare Other | Admitting: Neurology

## 2023-11-04 ENCOUNTER — Encounter: Payer: Self-pay | Admitting: Neurology

## 2023-11-04 VITALS — BP 129/77 | HR 79 | Ht 65.5 in

## 2023-11-04 DIAGNOSIS — H81399 Other peripheral vertigo, unspecified ear: Secondary | ICD-10-CM

## 2023-11-04 DIAGNOSIS — M542 Cervicalgia: Secondary | ICD-10-CM

## 2023-11-04 DIAGNOSIS — R208 Other disturbances of skin sensation: Secondary | ICD-10-CM | POA: Diagnosis not present

## 2023-11-04 DIAGNOSIS — G35 Multiple sclerosis: Secondary | ICD-10-CM | POA: Diagnosis not present

## 2023-11-04 DIAGNOSIS — R5383 Other fatigue: Secondary | ICD-10-CM | POA: Diagnosis not present

## 2023-11-04 DIAGNOSIS — R39198 Other difficulties with micturition: Secondary | ICD-10-CM

## 2023-11-04 DIAGNOSIS — I7381 Erythromelalgia: Secondary | ICD-10-CM

## 2023-11-04 DIAGNOSIS — G44229 Chronic tension-type headache, not intractable: Secondary | ICD-10-CM

## 2023-11-04 MED ORDER — METHYLPREDNISOLONE ACETATE 80 MG/ML IJ SUSP
80.0000 mg | Freq: Once | INTRAMUSCULAR | Status: AC
  Administered 2023-11-04: 80 mg via INTRAMUSCULAR

## 2023-11-04 MED ORDER — BUPIVACAINE HCL 0.5 % IJ SOLN: 2.0000 mL | Freq: Once | INTRAMUSCULAR | Status: AC

## 2023-11-04 NOTE — Progress Notes (Signed)
 [facility]  PATIENT: Tonya PERROTTO DOB: Jan 05, 1961  REFERRING CLINICIAN: Glena Landau is PCP HISTORY FROM: Patient   REASON FOR VISIT: MS and poor gait   HISTORICAL  CHIEF COMPLAINT:  Chief Complaint  Patient presents with   Follow-up    Pt in room 10. Alone. Here for MS. Pt still has left leg weakness, had 2 episodes of vertigo in the last 6 weeks. Pt reports she fell in the parking lot yesterday.    HISTORY OF PRESENT ILLNESS:  Tonya Powers is a 63 y.o. woman with an active form of secondary progressive MS.      Update 11/04/2023: She is off any DMT for MS (has non-relapsing SPMS).   She has not had any exacerbations but does note mild worsening of strength, gait and spasticity.      She has a recurrence of the positional vertigo - especially when she rolls left and looks up for 1-2 minutes at a time.    She had one episode last ing a couple days but notes it occurring mostly every morning.     She is also noting more left neck pain - tender over left occiput.      She has help daily as she cannot transfer as safely as she used to be able to do,    She has hand controls in her [employer] and feels she drives well but getting in/out is difficult.   She has fallen a couple times.  She has burning dysesthesias in her feet and abdomen.  Wearing socks or pants is uncomfortable and she usually just wears a nightgown.   She is is on gabapentin , lamotrigine  and tramadol .   Lidocaine  ointment had not helped.  Sometimes she uses ice with some benefit.   Many years ago she was on amitriptyline but it made  her very sleepy.   Feet are very red, especially in the morning.  Unclear if erythromelalgia.   She has not tried an ASA.  Feet tend to be hot when she wakes up and cold as the day goes on.     She has swelling in her feet. Feet feel hot and are red but are usually cold to the touch.  They are numb.  She saw [facility] and had studies.    Arteries were fine.    Venous doppler  showed slow flow in the superficial vein.    Pentoxifylline was poorly tolerated.  Nifedipine  had not helped.    She is seeing Orthopedics (Dr. Alfredo Ano) and may need to have a shoulder replacement.   She and the orthopedist are concerned about recovery due to her weakness f need to use both hands to transfer.   She has MS related ADD and fatigue, helped by Adderall 20 mg po bid.  When mor etired, we sometimes a day of IV Solumedrol.        MS History:  She presented with optic neuritis in 1991 followed shortly by difficulties with leg weakness and by right trigeminal neuralgia. In retrospect, couple years earlier when she had her daughter, has some difficulties with her legs and needed to go on short-term disability. At first she was not diagnosed with MS but after more of the symptoms she underwent MRI testing and had a lumbar puncture by Dr. Alwin Baars. The imaging and the CSF was consistent with multiple sclerosis. When Betaseron became available she was placed on that. She was on Betaseron for about 10 years. She felt that she did  not have too many exacerbations during that time but she had a lot of difficulty tolerating the Betaseron due to skin reactions. Around 12-15 years ago, she started to use a cane and she has had progressive gait disturbance over the last decade. Around the house for short distances, she uses a walker but uses her scooter for longer distances. Outside she uses a wheelchair pushed by others   She tried Ocrevus in 2018 but felt worse so stopped in 2020.  She has done Solu-Medrol   .  MRI Brain 02/28/2016 showed T2/FLAIR hyperintense foci in the periventricular, juxtacortical and deep white matter of both hemispheres and in the left cerebellar hemisphere in a pattern and configuration consistent with chronic demyelinating plaque associated with multiple sclerosis. None of the foci enhances. Compared to the MRI from 04/04/2015, there is no interval change.  MRI Cervical spine showed   multiple lesions within the spinal cord adjacent to C2-C3, C3, C4-C5, C5-C6 and C7-T1. When compared to the MRI dated 04/04/2015, these lesions appear to be chronic.  REVIEW OF SYSTEMS:  Constitutional: No fevers, chills, sweats, or change in appetite.  She has Fatigue Eyes: No visual changes, double vision, eye pain Ear, nose and throat: No hearing loss, ear pain, nasal congestion, sore throat Cardiovascular: No chest pain, palpitations Respiratory:  No shortness of breath at rest or with exertion.   No wheezes GastrointestinaI: Mild dysphagia.  Constipation.  No nausea, vomiting, diarrhea.   Genitourinary:  No dysuria but has urinary retention and frequency.  Desmopressin  helps nocturia. Musculoskeletal:  No neck pain,   Has lower back pain and buttocks.  Hips ans other joints ok Integumentary: No rash, pruritus, skin lesions Neurological: as above Psychiatric: see above Endocrine: No palpitations, diaphoresis, change in appetite, change in weigh or increased thirst Hematologic/Lymphatic:  No anemia, purpura, petechiae. Allergic/Immunologic: No itchy/runny eyes, nasal congestion, recent allergic reactions, rashes  ALLERGIES: Allergies  Allergen Reactions   Codeine Nausea And Vomiting   Imipramine      Vertigo    HOME MEDICATIONS: Outpatient Medications Prior to Visit  Medication Sig Dispense Refill   acetaminophen  (TYLENOL ) 500 MG tablet Take 1,000 mg by mouth daily as needed for pain.     amphetamine -dextroamphetamine  (ADDERALL) 20 MG tablet Take 1 tablet (20 mg total) by mouth 2 (two) times daily. 60 tablet 0   diazepam  (VALIUM ) 5 MG tablet TAKE 1 TABLET(5 MG) BY MOUTH EVERY 8 HOURS AS NEEDED 90 tablet 5   DULoxetine  (CYMBALTA ) 60 MG capsule One po bid 60 capsule 11   gabapentin  (NEURONTIN ) 800 MG tablet TAKE 1 TABLET(800 MG) BY MOUTH FOUR TIMES DAILY 120 tablet 5   lamoTRIgine  (LAMICTAL ) 200 MG tablet TAKE 1 TABLET BY MOUTH THREE TIMES DAILY 270 tablet 2   lidocaine  (LIDODERM )  5 % Place 1 patch daily onto the skin. Remove & Discard patch within 12 hours or as directed by MD 30 patch 11   meclizine  (ANTIVERT ) 25 MG tablet TAKE 1 TABLET(25 MG) BY MOUTH THREE TIMES DAILY AS NEEDED FOR DIZZINESS 30 tablet 2   omeprazole (PRILOSEC) 40 MG capsule Take 40 mg by mouth 2 (two) times daily.     ondansetron  (ZOFRAN ) 4 MG tablet Take 1 tablet (4 mg total) by mouth every 8 (eight) hours as needed for nausea or vomiting. 90 tablet 3   promethazine  (PHENERGAN ) 25 MG tablet Take 1 tablet (25 mg total) by mouth every 6 (six) hours as needed for nausea (nausea). 30 tablet 3   rOPINIRole  (REQUIP ) 0.5  MG tablet TAKE 1 TO 2 TABLETS(0.5 TO 1 MG) BY MOUTH AT BEDTIME 180 tablet 1   traMADol  (ULTRAM ) 50 MG tablet TAKE 1 TABLET BY MOUTH FOUR TIMES DAILY AS NEEDED FOR MODERATE PAIN 120 tablet 3   zolpidem  (AMBIEN ) 10 MG tablet TAKE 1 TABLET BY MOUTH AT BEDTIME AS NEEDED FOR SLEEP 30 tablet 5   hydrochlorothiazide  (MICROZIDE ) 12.5 MG capsule Take 1 capsule (12.5 mg total) by mouth in the morning. (Patient not taking: Reported on 11/04/2023) 30 capsule 5   zolpidem  (AMBIEN ) 10 MG tablet TAKE 1 TABLET BY MOUTH AT BEDTIME AS NEEDED FOR SLEEP (Patient not taking: Reported on 11/04/2023) 30 tablet 5   No facility-administered medications prior to visit.    PAST MEDICAL HISTORY: Past Medical History:  Diagnosis Date   Anxiety    Depression    Gait instability    uses wheelchair or assistance   H/O steroid therapy    IV infusion every 6 to 8 weeks- last 05-31-14 [facility]   History of breast biopsy    Multiple sclerosis (HCC)    Neuromuscular disorder (HCC)    MS   PONV (postoperative nausea and vomiting)    Skin cancer     PAST SURGICAL HISTORY: Past Surgical History:  Procedure Laterality Date   ABDOMINAL HYSTERECTOMY     BREAST BIOPSY Right 09/20/2012   Procedure: BREAST BIOPSY WITH NEEDLE LOCALIZATION;  Surgeon: Enid Harry, MD;  Location: [facility];   Service: General;  Laterality: Right;   BREAST EXCISIONAL BIOPSY Right    benign   CESAREAN SECTION     COLONOSCOPY WITH PROPOFOL  N/A 07/13/2014   Procedure: COLONOSCOPY WITH PROPOFOL ;  Surgeon: Almeda Aris, MD;  Location: [facility];  Service: Endoscopy;  Laterality: N/A;   COLONOSCOPY WITH PROPOFOL  N/A 12/13/2020   Procedure: COLONOSCOPY WITH PROPOFOL ;  Surgeon: Alvis Jourdain, MD;  Location: [facility];  Service: Endoscopy;  Laterality: N/A;   DEBRIDEMENT SKIN / SQ / MUSCLE OF ARM Right 07/13/2003   I & D; debridement of tissue within triceps muscle   ESOPHAGOGASTRODUODENOSCOPY (EGD) WITH PROPOFOL  N/A 12/13/2020   Procedure: ESOPHAGOGASTRODUODENOSCOPY (EGD) WITH PROPOFOL ;  Surgeon: Alvis Jourdain, MD;  Location: [facility];  Service: Endoscopy;  Laterality: N/A;   LEG SURGERY     sx as a child   MELANOMA EXCISION     PARTIAL HYSTERECTOMY     TONSILLECTOMY     as a child    FAMILY HISTORY: Family History  Problem Relation Age of Onset   Hyperlipidemia Mother    Hypertension Mother    Allergic rhinitis Father    Hypertension Father    Diabetes type II Father    Allergic rhinitis Sister     SOCIAL HISTORY:  Social History   Socioeconomic History   Marital status: Married    Spouse name: Not on file   Number of children: Not on file   Years of education: Not on file   Highest education level: Not on file  Occupational History   Not on file  Tobacco Use   Smoking status: Never    Passive exposure: Never   Smokeless tobacco: Never  Vaping Use   Vaping status: Never Used  Substance and Sexual Activity   Alcohol use: Not Currently    Comment: social   Drug use: No   Sexual activity: Not on file  Other Topics Concern   Not on file  Social History Narrative   Lives at home w/ her husband  Social Drivers of Corporate investment banker Strain: Not on file  Food Insecurity: Not on file  Transportation Needs: Not on file  Physical Activity: Not on file   Stress: Not on file  Social Connections: Not on file  Intimate Partner Violence: Not on file     PHYSICAL EXAM  Vitals:   11/04/23 1300  BP: 129/77  Pulse: 79  Height: 5' 5.5" (1.664 m)     Body mass index is 24.58 kg/m.   General: The patient is well-developed and well-nourished and in no acute distress   Skin/Ext/Musculoskeletal: There is pedal edema..  Her feet are cold.  Neck is tender over the left much greater than right occiput regions.  He has typical pain is reduced by lifting her head.  .     Neurologic Exam  Mental status: The patient is alert and oriented x 3 at the time of the examination. The patient has apparent normal recent and remote memory, with an apparently normal attention span and concentration ability.   Speech is normal.  Cranial nerves: Extraocular movements are full.  Facial strength and sensation was normal.  Trapezius strength is normal.. No dysarthria is noted.  No obvious hearing deficits are noted.  Motor:  Muscle bulk and tone are normal.  Strength is 2+/5 left and 3/5 right proximal leg strength.  Strength is 4- to 4/5 distally.  The arms are fairly strong.  Sensory: She has normal sensation to touch and vibration in the arms but reduced touch sensation in the left leg relative to the right  Coordination: Cerebellar testing reveals reduced finger-nose-finger and poor heel-to-shin bilaterally.  Gait and station: She needs support to stand.  She needs bilateral support to stand up.  She is able to assist with transfer to the table  Reflexes: Deep tendon reflexes are symmetric and increased in legs bilaterally with spread at the knees.  She has nonsustained ankle clonus bilaterally.  The Dix-Hallpike maneuver did not elicit nystagmus with the head in either direction.  However, with the head down into the right she did feel moderate vertigo and the Epley maneuver was completed from there.       DIAGNOSTIC DATA (LABS, IMAGING, TESTING) -  I reviewed patient records, labs, notes, testing and imaging myself where available.  Lab Results  Component Value Date   WBC 8.5 06/07/2019   HGB 13.9 06/07/2019   HCT 40.8 06/07/2019   MCV 91 06/07/2019   PLT 271 06/07/2019      Component Value Date/Time   NA 144 06/07/2019 1519   K 5.2 06/07/2019 1519   CL 100 06/07/2019 1519   CO2 27 06/07/2019 1519   GLUCOSE 95 06/07/2019 1519   GLUCOSE 104 (H) 03/07/2018 1138   BUN 15 06/07/2019 1519   CREATININE 0.70 06/07/2019 1519   CALCIUM 10.0 06/07/2019 1519   PROT 6.8 04/14/2021 1346   ALBUMIN 4.9 06/07/2019 1519   AST 19 06/07/2019 1519   ALT 13 06/07/2019 1519   ALKPHOS 101 06/07/2019 1519   BILITOT 0.2 06/07/2019 1519   GFRNONAA 94 06/07/2019 1519   GFRAA 109 06/07/2019 1519      ASSESSMENT AND PLAN  Multiple sclerosis (HCC) - Plan: bupivacaine  (MARCAINE ) 0.5 % (with pres) injection 2 mL, methylPREDNISolone  acetate (DEPO-MEDROL ) injection 80 mg  Urinary dysfunction  Other fatigue  Dysesthesia  Erythromelalgia (HCC)  Neck pain  Chronic tension-type headache, not intractable  Peripheral positional vertigo, unspecified laterality   1.   She remains off a  disease modifying therapy.  She will Continue IV Solu-Medrol  every 6-8 weeks as needed 2.   Continue Adderall  for  her fatigue and attention deficit.  New prescription sent in 3.   Diazepam  nightly for spasticity and insomnia.  Tramadol  for pain. 4.   Ondansetron  one po prn daily for nausea   5.   Continue duloxetine   6.   Left splenius capitis TPI with 80 mg Depo-Medrol  and 2.5 cc Marcaine  using sterile technique.  She tolerated the procedure well and pain was better afterwards. 7.   Epley Maneuver starting with head down to the right.  Nystagmus was not evoked by this procedure.  I instructed her on the Brandt-Daroff exercises that she will perform for the next couple of weeks if vertigo persists.  8.   She will return to see me in 6 months or sooner if she  has new or worsening neurologic symptoms.   40-minute office visit with the majority of the time spent face-to-face for history and physical, discussion/counseling and decision-making.  Additional time with record review and documentation.  This visit is part of a comprehensive longitudinal care medical relationship regarding the patients primary diagnosis of MS and related concerns.   Lunell Robart A. Godwin Lat, MD, PhD 11/04/2023, 8:02 PM Certified in Neurology, Clinical Neurophysiology, Sleep Medicine, Pain Medicine and Neuroimaging  [facility] [address] [place], [place] [address] [phone]

## 2023-11-10 ENCOUNTER — Other Ambulatory Visit: Payer: Self-pay | Admitting: Neurology

## 2023-11-11 MED ORDER — AMPHETAMINE-DEXTROAMPHETAMINE 20 MG PO TABS: 20.0000 mg | ORAL_TABLET | Freq: Two times a day (BID) | ORAL | 0 refills | Status: DC

## 2023-11-11 NOTE — Telephone Encounter (Signed)
 Last seen on 11/04/23 Follow up scheduled on 05/23/24   Dispensed Days Supply Quantity Provider Pharmacy  ZOLPIDEM  10MG  TABLETS 10/14/2023 30 30 each Sater, Sherida Dimmer, MD [facility]...      Rx pending to be filled on 11/13/23

## 2023-11-14 ENCOUNTER — Other Ambulatory Visit: Payer: Self-pay | Admitting: Neurology

## 2023-11-15 NOTE — Telephone Encounter (Signed)
 Last seen on 11/04/23 Follow up scheduled on 05/23/24   Rx was last filled on pt will need to just pick up Rx at pharmacy.  Dispensed Days Supply Quantity Provider Pharmacy  GABAPENTIN  800 MG TABS 11/08/2023 30 120 tablet Sater, Sherida Dimmer, MD [facility] #.[facility]

## 2023-11-30 ENCOUNTER — Encounter: Payer: Self-pay | Admitting: Neurology

## 2023-11-30 NOTE — Telephone Encounter (Signed)
 Spoke w/ Dr. Godwin Lat who approved having her come in tomorrow for 1G IV solumedrol. Per [facility], they can fit pt in between 8-10am tomorrow.

## 2023-11-30 NOTE — Telephone Encounter (Signed)
 Gave completed/signed order below to [facility]

## 2023-11-30 NOTE — Telephone Encounter (Signed)
 Per [place], pt coming tomorrow at 10am.

## 2023-12-01 DIAGNOSIS — G35 Multiple sclerosis: Secondary | ICD-10-CM | POA: Diagnosis not present

## 2023-12-12 ENCOUNTER — Other Ambulatory Visit: Payer: Self-pay | Admitting: Neurology

## 2023-12-13 ENCOUNTER — Other Ambulatory Visit: Payer: Self-pay | Admitting: Neurology

## 2023-12-13 NOTE — Telephone Encounter (Signed)
 Last seen on 11/04/23 Follow up scheduled on 05/23/24

## 2023-12-14 NOTE — Telephone Encounter (Signed)
 Last seen on 11/04/23 Follow up scheduled on 05/23/24    Dispensed Days Supply Quantity Provider Pharmacy  ZOLPIDEM  10MG  TABLETS 11/13/2023 30 30 each Sater, Sherida Dimmer, MD [facility]     Rx pending to be signed

## 2023-12-15 ENCOUNTER — Other Ambulatory Visit: Payer: Self-pay | Admitting: Neurology

## 2023-12-15 MED ORDER — AMPHETAMINE-DEXTROAMPHETAMINE 20 MG PO TABS: 20.0000 mg | ORAL_TABLET | Freq: Two times a day (BID) | ORAL | 0 refills | Status: DC

## 2023-12-15 NOTE — Telephone Encounter (Signed)
 Last seen on 11/04/23 Follow up scheduled on 05/23/24   Dispensed Days Supply Quantity Provider Pharmacy  D-AMPHETAMINE  SALT COMBO 20MG  TABS 11/12/2023 30 60 each Dohmeier, Raoul Byes, MD [facility]     Rx pending to be signed

## 2023-12-15 NOTE — Telephone Encounter (Signed)
 Pt is requesting a refill for amphetamine-dextroamphetamine (ADDERALL) 20 MG tablet.  Pharmacy: [facility] [phone]

## 2024-01-05 ENCOUNTER — Ambulatory Visit (INDEPENDENT_AMBULATORY_CARE_PROVIDER_SITE_OTHER): Admitting: Otolaryngology

## 2024-01-05 VITALS — BP 105/72 | HR 76 | Ht 65.0 in | Wt 165.0 lb

## 2024-01-05 DIAGNOSIS — R0981 Nasal congestion: Secondary | ICD-10-CM

## 2024-01-05 DIAGNOSIS — J343 Hypertrophy of nasal turbinates: Secondary | ICD-10-CM | POA: Diagnosis not present

## 2024-01-05 DIAGNOSIS — J342 Deviated nasal septum: Secondary | ICD-10-CM | POA: Diagnosis not present

## 2024-01-05 DIAGNOSIS — J310 Chronic rhinitis: Secondary | ICD-10-CM | POA: Diagnosis not present

## 2024-01-05 MED ORDER — BUDESONIDE 32 MCG/ACT NA SUSP: 2.0000 | Freq: Every day | NASAL | 10 refills | Status: AC

## 2024-01-05 NOTE — Progress Notes (Signed)
 CC: Chronic nasal obstruction  HPI:  Tonya Powers is a 63 y.o. female who presents today complaining of chronic nasal obstruction for more than 1 year.  The obstruction is worse on the left side.  The patient has been symptomatic year-round.  She tried the use of Flonase nasal spray for 2 months without significant improvement in her symptoms.  She used Flonase intermittently due to the burning sensation.  She denies any significant environmental allergies.  She also denies any recent sinusitis.  Currently she denies any facial pain, fever, or visual change.  She underwent adenotonsillectomy and bilateral myringotomy with tube placement as a child.  She has no previous nasal surgery.  She has a history of multiple sclerosis.  She is wheelchair-bound.  Past Medical History:  Diagnosis Date   Anxiety    Depression    Gait instability    uses wheelchair or assistance   H/O steroid therapy    IV infusion every 6 to 8 weeks- last 05-31-14 [facility]   History of breast biopsy    Multiple sclerosis (HCC)    Neuromuscular disorder (HCC)    MS   PONV (postoperative nausea and vomiting)    Skin cancer     Past Surgical History:  Procedure Laterality Date   ABDOMINAL HYSTERECTOMY     BREAST BIOPSY Right 09/20/2012   Procedure: BREAST BIOPSY WITH NEEDLE LOCALIZATION;  Surgeon: Donnice Bury, MD;  Location: [facility];  Service: General;  Laterality: Right;   BREAST EXCISIONAL BIOPSY Right    benign   CESAREAN SECTION     COLONOSCOPY WITH PROPOFOL  N/A 07/13/2014   Procedure: COLONOSCOPY WITH PROPOFOL ;  Surgeon: Belvie JONETTA Just, MD;  Location: [facility];  Service: Endoscopy;  Laterality: N/A;   COLONOSCOPY WITH PROPOFOL  N/A 12/13/2020   Procedure: COLONOSCOPY WITH PROPOFOL ;  Surgeon: Just Belvie, MD;  Location: [facility];  Service: Endoscopy;  Laterality: N/A;   DEBRIDEMENT SKIN / SQ / MUSCLE OF ARM Right 07/13/2003   I & D; debridement of tissue within triceps  muscle   ESOPHAGOGASTRODUODENOSCOPY (EGD) WITH PROPOFOL  N/A 12/13/2020   Procedure: ESOPHAGOGASTRODUODENOSCOPY (EGD) WITH PROPOFOL ;  Surgeon: Just Belvie, MD;  Location: [facility];  Service: Endoscopy;  Laterality: N/A;   LEG SURGERY     sx as a child   MELANOMA EXCISION     PARTIAL HYSTERECTOMY     TONSILLECTOMY     as a child    Family History  Problem Relation Age of Onset   Hyperlipidemia Mother    Hypertension Mother    Allergic rhinitis Father    Hypertension Father    Diabetes type II Father    Allergic rhinitis Sister     Social History:  reports that she has never smoked. She has never been exposed to tobacco smoke. She has never used smokeless tobacco. She reports that she does not currently use alcohol. She reports that she does not use drugs.  Allergies:  Allergies  Allergen Reactions   Codeine Nausea And Vomiting   Imipramine      Vertigo    Prior to Admission medications   Medication Sig Start Date End Date Taking? Authorizing Provider  acetaminophen  (TYLENOL ) 500 MG tablet Take 1,000 mg by mouth daily as needed for pain.    [provider]  amphetamine -dextroamphetamine  (ADDERALL) 20 MG tablet Take 1 tablet (20 mg total) by mouth 2 (two) times daily. 12/15/23   Sater, Charlie LABOR, MD  diazepam  (VALIUM ) 5 MG tablet TAKE 1 TABLET(5  MG) BY MOUTH EVERY 8 HOURS AS NEEDED 10/04/23   Sater, Charlie LABOR, MD  DULoxetine  (CYMBALTA ) 60 MG capsule One po bid 04/27/23   Sater, Charlie LABOR, MD  gabapentin  (NEURONTIN ) 800 MG tablet TAKE 1 TABLET(800 MG) BY MOUTH FOUR TIMES DAILY 12/13/23   Sater, Charlie LABOR, MD  hydrochlorothiazide  (MICROZIDE ) 12.5 MG capsule Take 1 capsule (12.5 mg total) by mouth in the morning. Patient not taking: Reported on 11/04/2023 10/21/22   Vear Charlie LABOR, MD  lamoTRIgine  (LAMICTAL ) 200 MG tablet TAKE 1 TABLET BY MOUTH THREE TIMES DAILY 05/20/23   Sater, Charlie LABOR, MD  lidocaine  (LIDODERM ) 5 % Place 1 patch daily onto the skin. Remove & Discard  patch within 12 hours or as directed by MD 05/06/17   Sater, Charlie LABOR, MD  meclizine  (ANTIVERT ) 25 MG tablet TAKE 1 TABLET(25 MG) BY MOUTH THREE TIMES DAILY AS NEEDED FOR DIZZINESS 04/14/21   Sater, Charlie LABOR, MD  omeprazole (PRILOSEC) 40 MG capsule Take 40 mg by mouth 2 (two) times daily.    [provider]  ondansetron  (ZOFRAN ) 4 MG tablet Take 1 tablet (4 mg total) by mouth every 8 (eight) hours as needed for nausea or vomiting. 01/19/23   Sater, Charlie LABOR, MD  promethazine  (PHENERGAN ) 25 MG tablet Take 1 tablet (25 mg total) by mouth every 6 (six) hours as needed for nausea (nausea). 09/27/19   Sater, Charlie LABOR, MD  rOPINIRole  (REQUIP ) 0.5 MG tablet TAKE 1 TO 2 TABLETS(0.5 TO 1 MG) BY MOUTH AT BEDTIME 07/01/23   Sater, Charlie LABOR, MD  traMADol  (ULTRAM ) 50 MG tablet TAKE 1 TABLET BY MOUTH FOUR TIMES DAILY AS NEEDED FOR MODERATE PAIN 09/21/23   Sater, Charlie LABOR, MD  zolpidem  (AMBIEN ) 10 MG tablet TAKE 1 TABLET BY MOUTH AT BEDTIME AS NEEDED FOR SLEEP 05/13/23   Sater, Charlie LABOR, MD  zolpidem  (AMBIEN ) 10 MG tablet TAKE 1 TABLET BY MOUTH AT BEDTIME AS NEEDED FOR SLEEP 11/11/23   Sater, Charlie LABOR, MD  zolpidem  (AMBIEN ) 10 MG tablet TAKE 1 TABLET BY MOUTH AT BEDTIME AS NEEDED FOR SLEEP 12/14/23   Sater, Charlie LABOR, MD    There were no vitals taken for this visit. Exam: General: Communicates without difficulty, well nourished, no acute distress. Head: Normocephalic, no evidence injury, no tenderness, facial buttresses intact without stepoff. Face/sinus: No tenderness to palpation and percussion. Facial movement is normal and symmetric. Eyes: PERRL, EOMI. No scleral icterus, conjunctivae clear. Neuro: CN II exam reveals vision grossly intact.  No nystagmus at any point of gaze. Ears: Auricles well formed without lesions.  Ear canals are intact without mass or lesion.  No erythema or edema is appreciated.  The TMs are intact without fluid. Nose: External evaluation reveals normal support and skin without  lesions.  Dorsum is intact.  Anterior rhinoscopy reveals congested mucosa over anterior aspect of inferior turbinates and intact septum.  No purulence noted. Oral:  Oral cavity and oropharynx are intact, symmetric, without erythema or edema.  Mucosa is moist without lesions. Neck: Full range of motion without pain.  There is no significant lymphadenopathy.  No masses palpable.  Thyroid  bed within normal limits to palpation.  Parotid glands and submandibular glands equal bilaterally without mass.  Trachea is midline. Neuro:  CN 2-12 grossly intact.   Procedure:  Flexible Nasal Endoscopy: Description: Risks, benefits, and alternatives of flexible endoscopy were explained to the patient.  Specific mention was made of the risk of throat numbness with difficulty swallowing, possible bleeding  from the nose and mouth, and pain from the procedure.  The patient gave oral consent to proceed.  The flexible scope was inserted into the right nasal cavity.  Endoscopy of the interior nasal cavity, superior, inferior, and middle meatus was performed. The sphenoid-ethmoid recess was examined. Edematous mucosa was noted.  No polyp, mass, or lesion was appreciated. Nasal septal deviation noted. Olfactory cleft was clear.  Nasopharynx was clear.  Turbinates were hypertrophied but without mass.  The procedure was repeated on the contralateral side with similar findings.  The patient tolerated the procedure well.   Assessment: 1.  Chronic rhinitis with nasal mucosal congestion, nasal septal deviation, and bilateral inferior turbinate hypertrophy. 2.  No polyps, mass, lesion, or purulent drainage is noted today.  Plan: 1.  The physical exam and nasal endoscopy findings are reviewed with the patient. 2.  Rhinocort  nasal spray 2 sprays each nostril daily.  The importance of consistent daily use is discussed. 3.  The technique of applying the steroid nasal spray is demonstrated to the patient. 4.  The patient will return for  reevaluation in 2 months.  Hamish Banks W [place] 01/05/2024, 12:59 PM

## 2024-01-06 ENCOUNTER — Other Ambulatory Visit: Payer: Self-pay | Admitting: Neurology

## 2024-01-06 NOTE — Telephone Encounter (Signed)
 Last seen on 11/04/23 Follow up scheduled on 05/23/24

## 2024-01-27 ENCOUNTER — Other Ambulatory Visit: Payer: Self-pay | Admitting: Neurology

## 2024-01-27 MED ORDER — AMPHETAMINE-DEXTROAMPHETAMINE 20 MG PO TABS: 20.0000 mg | ORAL_TABLET | Freq: Two times a day (BID) | ORAL | 0 refills | Status: DC

## 2024-01-27 NOTE — Telephone Encounter (Signed)
 Last seen 11/04/23 and next f/u 05/23/24. Last refilled 12/15/23 #60.

## 2024-01-27 NOTE — Telephone Encounter (Signed)
 Pt  called to request medication refill  amphetamine -dextroamphetamine  (ADDERALL) 20 MG tablet   Pt would like medication to be sent to ;  [id number] [address], [place] - [address] AT [facility] [address] (Ph: [phone])

## 2024-02-03 DIAGNOSIS — K080 Exfoliation of teeth due to systemic causes: Secondary | ICD-10-CM | POA: Diagnosis not present

## 2024-02-12 ENCOUNTER — Encounter: Payer: Self-pay | Admitting: Neurology

## 2024-02-16 ENCOUNTER — Other Ambulatory Visit: Payer: Self-pay | Admitting: Neurology

## 2024-02-16 NOTE — Telephone Encounter (Signed)
 Last seen on 11/04/23 Follow up scheduled on 05/23/24

## 2024-03-01 ENCOUNTER — Encounter: Payer: Self-pay | Admitting: Neurology

## 2024-03-01 MED ORDER — AMPHETAMINE-DEXTROAMPHETAMINE 20 MG PO TABS: 20.0000 mg | ORAL_TABLET | Freq: Two times a day (BID) | ORAL | 0 refills | Status: DC

## 2024-03-01 NOTE — Telephone Encounter (Signed)
 Last seen on 11/04/23 Follow up scheduled on 05/23/24   Dispensed Days Supply Quantity Provider Pharmacy  D-AMPHETAMINE  SALT COMBO 20MG  TABS 01/27/2024 30 60 each Sater, Charlie LABOR, MD [facility] #...   Rx pending to be signed

## 2024-03-06 ENCOUNTER — Encounter: Payer: Self-pay | Admitting: Neurology

## 2024-03-06 NOTE — Telephone Encounter (Signed)
 Gave signed order below to [facility]:

## 2024-03-07 ENCOUNTER — Ambulatory Visit (INDEPENDENT_AMBULATORY_CARE_PROVIDER_SITE_OTHER): Admitting: Otolaryngology

## 2024-03-07 DIAGNOSIS — G35 Multiple sclerosis: Secondary | ICD-10-CM | POA: Diagnosis not present

## 2024-03-29 ENCOUNTER — Other Ambulatory Visit: Payer: Self-pay | Admitting: Neurology

## 2024-03-29 NOTE — Telephone Encounter (Signed)
 You are work in provider this afternoon Last seen on 11/04/23 Follow up scheduled on 05/23/24   Dispensed Days Supply Quantity Provider Pharmacy  TRAMADOL  50MG  TABLETS 01/28/2024 30 120 each Sater, Charlie LABOR, MD [facility] #...   Rx pending to be signed

## 2024-04-10 ENCOUNTER — Other Ambulatory Visit: Payer: Self-pay | Admitting: Neurology

## 2024-04-10 MED ORDER — AMPHETAMINE-DEXTROAMPHETAMINE 20 MG PO TABS: 20.0000 mg | ORAL_TABLET | Freq: Two times a day (BID) | ORAL | 0 refills | Status: DC

## 2024-04-10 NOTE — Telephone Encounter (Signed)
 Pt is requesting a refill for amphetamine-dextroamphetamine (ADDERALL) 20 MG tablet.  Pharmacy: [facility] [phone]

## 2024-04-10 NOTE — Telephone Encounter (Signed)
 Last seen on 11/04/23 Follow up scheduled on 05/23/24  Dispensed Days Supply Quantity Provider Pharmacy  D-AMPHETAMINE  SALT COMBO 20MG  TABS 03/02/2024 30 60 each Sater, Charlie LABOR, MD [facility] #...     Rx pending to be signed

## 2024-04-25 ENCOUNTER — Encounter: Payer: Self-pay | Admitting: Neurology

## 2024-04-26 ENCOUNTER — Other Ambulatory Visit: Payer: Self-pay | Admitting: Neurology

## 2024-04-26 DIAGNOSIS — G35D Multiple sclerosis, unspecified: Secondary | ICD-10-CM

## 2024-04-26 DIAGNOSIS — R260 Ataxic gait: Secondary | ICD-10-CM

## 2024-04-26 DIAGNOSIS — R296 Repeated falls: Secondary | ICD-10-CM

## 2024-04-26 NOTE — Telephone Encounter (Signed)
 She will need a new office visit to be able to start home health.

## 2024-04-30 DIAGNOSIS — G44229 Chronic tension-type headache, not intractable: Secondary | ICD-10-CM | POA: Diagnosis not present

## 2024-04-30 DIAGNOSIS — F32A Depression, unspecified: Secondary | ICD-10-CM | POA: Diagnosis not present

## 2024-04-30 DIAGNOSIS — Z791 Long term (current) use of non-steroidal anti-inflammatories (NSAID): Secondary | ICD-10-CM | POA: Diagnosis not present

## 2024-04-30 DIAGNOSIS — F988 Other specified behavioral and emotional disorders with onset usually occurring in childhood and adolescence: Secondary | ICD-10-CM | POA: Diagnosis not present

## 2024-04-30 DIAGNOSIS — Z79891 Long term (current) use of opiate analgesic: Secondary | ICD-10-CM | POA: Diagnosis not present

## 2024-04-30 DIAGNOSIS — F419 Anxiety disorder, unspecified: Secondary | ICD-10-CM | POA: Diagnosis not present

## 2024-04-30 DIAGNOSIS — Z90710 Acquired absence of both cervix and uterus: Secondary | ICD-10-CM | POA: Diagnosis not present

## 2024-04-30 DIAGNOSIS — R21 Rash and other nonspecific skin eruption: Secondary | ICD-10-CM | POA: Diagnosis not present

## 2024-04-30 DIAGNOSIS — H8110 Benign paroxysmal vertigo, unspecified ear: Secondary | ICD-10-CM | POA: Diagnosis not present

## 2024-04-30 DIAGNOSIS — Z9089 Acquired absence of other organs: Secondary | ICD-10-CM | POA: Diagnosis not present

## 2024-04-30 DIAGNOSIS — I7381 Erythromelalgia: Secondary | ICD-10-CM | POA: Diagnosis not present

## 2024-04-30 DIAGNOSIS — G500 Trigeminal neuralgia: Secondary | ICD-10-CM | POA: Diagnosis not present

## 2024-04-30 DIAGNOSIS — Z85828 Personal history of other malignant neoplasm of skin: Secondary | ICD-10-CM | POA: Diagnosis not present

## 2024-04-30 DIAGNOSIS — G35C1 Active secondary progressive multiple sclerosis: Secondary | ICD-10-CM | POA: Diagnosis not present

## 2024-04-30 DIAGNOSIS — Z9181 History of falling: Secondary | ICD-10-CM | POA: Diagnosis not present

## 2024-05-01 NOTE — Telephone Encounter (Signed)
 Received below message from [facility], sent MyChart message to patient.

## 2024-05-03 ENCOUNTER — Other Ambulatory Visit: Payer: Self-pay | Admitting: Neurology

## 2024-05-04 NOTE — Telephone Encounter (Signed)
 Follow up scheduled on 11/04/23 Follow up scheduled on 05/23/24   Rx not mentioned in recent note for me to approve.

## 2024-05-06 ENCOUNTER — Other Ambulatory Visit: Payer: Self-pay | Admitting: Neurology

## 2024-05-08 ENCOUNTER — Telehealth: Payer: Self-pay | Admitting: Neurology

## 2024-05-08 NOTE — Telephone Encounter (Signed)
 Hadassah, PT [facility] has called to report they have not gone out to pt yet because they are waiting on approval for VO, Hadassah is aware of pt's upcoming appointment date

## 2024-05-08 NOTE — Telephone Encounter (Signed)
 Pt needed updated visit before being able to start with home health. Pt currently scheduled to see Dr. Vear 05/23/24.

## 2024-05-10 NOTE — Telephone Encounter (Signed)
 Last seen on 11/04/23 Follow up scheduled 05/23/24

## 2024-05-12 ENCOUNTER — Other Ambulatory Visit: Payer: Self-pay | Admitting: Neurology

## 2024-05-15 NOTE — Telephone Encounter (Signed)
 Last seen on 11/04/23 Follow up scheduled on 05/23/24

## 2024-05-17 ENCOUNTER — Other Ambulatory Visit: Payer: Self-pay | Admitting: Neurology

## 2024-05-18 ENCOUNTER — Other Ambulatory Visit: Payer: Self-pay

## 2024-05-23 ENCOUNTER — Ambulatory Visit: Admitting: Neurology

## 2024-05-23 ENCOUNTER — Encounter: Payer: Self-pay | Admitting: Neurology

## 2024-05-23 VITALS — BP 145/82 | HR 74 | Resp 15

## 2024-05-23 DIAGNOSIS — G35C0 Secondary progressive multiple sclerosis, unspecified: Secondary | ICD-10-CM | POA: Diagnosis not present

## 2024-05-23 DIAGNOSIS — R296 Repeated falls: Secondary | ICD-10-CM

## 2024-05-23 DIAGNOSIS — R42 Dizziness and giddiness: Secondary | ICD-10-CM

## 2024-05-23 DIAGNOSIS — I7381 Erythromelalgia: Secondary | ICD-10-CM

## 2024-05-23 DIAGNOSIS — R5383 Other fatigue: Secondary | ICD-10-CM

## 2024-05-23 DIAGNOSIS — R208 Other disturbances of skin sensation: Secondary | ICD-10-CM

## 2024-05-23 DIAGNOSIS — R39198 Other difficulties with micturition: Secondary | ICD-10-CM

## 2024-05-23 DIAGNOSIS — R260 Ataxic gait: Secondary | ICD-10-CM

## 2024-05-23 MED ORDER — AMPHETAMINE-DEXTROAMPHETAMINE 20 MG PO TABS: 20.0000 mg | ORAL_TABLET | Freq: Two times a day (BID) | ORAL | 0 refills | Status: DC

## 2024-05-23 MED ORDER — DIAZEPAM 5 MG PO TABS: 5.0000 mg | ORAL_TABLET | Freq: Three times a day (TID) | ORAL | 5 refills | Status: AC | PRN

## 2024-05-23 NOTE — Progress Notes (Addendum)
 "  [facility]  PATIENT: Tonya Powers DOB: [birth year]  REFERRING CLINICIAN: Joen Gentry is PCP HISTORY FROM: Patient   REASON FOR VISIT: MS and poor gait   HISTORICAL  CHIEF COMPLAINT:  Chief Complaint  Patient presents with   Multiple Sclerosis    Rm10, alone, Pt presents for follow up of MS, ATAXIC GAIT,  & FALLS     HISTORY OF PRESENT ILLNESS:  Tonya Powers is a 63 y.o. woman with an active form of secondary progressive MS.      Update 05/23/2024: Mobility evaluation: She is currently using a scooter but needs to obtain a power wheelchair.  She has bilateral leg weakness and spasticity from her multiple sclerosis.  She is unable to walk with cane and can take just a step or 2 with a walker.  She is able to transfer independently.  Due to weakness and MS related fatigue, she is unable to adequately propel a manual wheelchair.  She sometimes has difficulty getting in and out of the scooter and has had some falls while transferring.  She also requires a smaller turning radius.  Therefore, she needs a power wheelchair.  It is fairly symmetric and hand controls could be on side of choice.  She is off any DMT for MS (has non-relapsing SPMS).   She has no definite exacerbaio but has had additional episodes of vertigo, including one lasting 3 days   When these occur, she will do the B-D exercise    Valium  does not help these spells though t does help if it triggers more anxiety.      Zofran  has helped if nausea is present.   It works better than meclizine   She can often transfer independently but has had some falls.  ,    She has hand controls in her fleeta and feels she drives well but getting in/out is difficult.   She has fallen a couple times getting out of the [place].   She fatigues easily.  She has needed EMT to help her get up a few times.   She has had a couple cuts due to falls incluing one on her toes that bleeds on/off.    She is on a bASA  She has burning  dysesthesias in her feet and abdomen.   She is is on gabapentin , lamotrigine  and tramadol .   Lidocaine  ointment had not helped.  Sometimes she uses ice with some benefit.   Many years ago she was on amitriptyline but it made  her very sleepy.   Feet are very red, especially in the morning.  Unclear if erythromelalgia.    bASA has not helped.    Feet tend to be hot when she wakes up and cold as the day goes on.    They are numb.  She saw [facility] and had studies.    Arteries were fine.    Venous doppler showed slow flow in the superficial vein.    Pentoxifylline was poorly tolerated.  Nifedipine  had not helped.    She is seeing Orthopedics (Dr. Emogene Herring) and may need to have a shoulder replacement.   She and the orthopedist are concerned about recovery due to her weakness f need to use both hands to transfer.    She has put off the surgery  She has MS related ADD and fatigue, helped by Adderall 20 mg po bid.  When she is having a lot more fatigue,  we sometimes do a day of IV Solumedrol.  MS History:  She presented with optic neuritis in 1991 followed shortly by difficulties with leg weakness and by right trigeminal neuralgia. In retrospect, couple years earlier when she had her daughter, has some difficulties with her legs and needed to go on short-term disability. At first she was not diagnosed with MS but after more of the symptoms she underwent MRI testing and had a lumbar puncture by Dr. Eben. The imaging and the CSF was consistent with multiple sclerosis. When Betaseron became available she was placed on that. She was on Betaseron for about 10 years. She felt that she did not have too many exacerbations during that time but she had a lot of difficulty tolerating the Betaseron due to skin reactions. Around 12-15 years ago, she started to use a cane and she has had progressive gait disturbance over the last decade. Around the house for short distances, she uses a walker but uses her scooter  for longer distances. Outside she uses a wheelchair pushed by others   She tried Ocrevus in 2018 but felt worse so stopped in 2020.  She has done Solu-Medrol   .  MRI Brain 02/28/2016 showed T2/FLAIR hyperintense foci in the periventricular, juxtacortical and deep white matter of both hemispheres and in the left cerebellar hemisphere in a pattern and configuration consistent with chronic demyelinating plaque associated with multiple sclerosis. None of the foci enhances. Compared to the MRI from 04/04/2015, there is no interval change.  MRI Cervical spine showed  multiple lesions within the spinal cord adjacent to C2-C3, C3, C4-C5, C5-C6 and C7-T1. When compared to the MRI dated 04/04/2015, these lesions appear to be chronic.  REVIEW OF SYSTEMS:  Constitutional: No fevers, chills, sweats, or change in appetite.  She has Fatigue Eyes: No visual changes, double vision, eye pain Ear, nose and throat: No hearing loss, ear pain, nasal congestion, sore throat Cardiovascular: No chest pain, palpitations Respiratory:  No shortness of breath at rest or with exertion.   No wheezes GastrointestinaI: Mild dysphagia.  Constipation.  No nausea, vomiting, diarrhea.   Genitourinary:  No dysuria but has urinary retention and frequency.  Desmopressin  helps nocturia. Musculoskeletal:  No neck pain,   Has lower back pain and buttocks.  Hips ans other joints ok Integumentary: No rash, pruritus, skin lesions Neurological: as above Psychiatric: see above Endocrine: No palpitations, diaphoresis, change in appetite, change in weigh or increased thirst Hematologic/Lymphatic:  No anemia, purpura, petechiae. Allergic/Immunologic: No itchy/runny eyes, nasal congestion, recent allergic reactions, rashes  ALLERGIES: Allergies  Allergen Reactions   Codeine Nausea And Vomiting and Nausea Only    Other Reaction(s): Not available, Unknown   Duloxetine  Hcl     Other Reaction(s): no taste   Hydrocodone     Other Reaction(s):  Unknown   Imipramine      Vertigo    HOME MEDICATIONS: Outpatient Medications Prior to Visit  Medication Sig Dispense Refill   acetaminophen  (TYLENOL ) 500 MG tablet Take 1,000 mg by mouth daily as needed for pain.     budesonide  (RHINOCORT  AQUA) 32 MCG/ACT nasal spray Place 2 sprays into both nostrils daily. 1 g 10   DULoxetine  (CYMBALTA ) 60 MG capsule TAKE 1 CAPSULE BY MOUTH TWICE DAILY 60 capsule 11   gabapentin  (NEURONTIN ) 800 MG tablet TAKE 1 TABLET(800 MG) BY MOUTH FOUR TIMES DAILY 120 tablet 5   hydrochlorothiazide  (MICROZIDE ) 12.5 MG capsule Take 1 capsule (12.5 mg total) by mouth in the morning. 30 capsule 5   lamoTRIgine  (LAMICTAL ) 200 MG tablet TAKE 1 TABLET  BY MOUTH THREE TIMES DAILY 270 tablet 0   lidocaine  (LIDODERM ) 5 % Place 1 patch daily onto the skin. Remove & Discard patch within 12 hours or as directed by MD 30 patch 11   meclizine  (ANTIVERT ) 25 MG tablet TAKE 1 TABLET(25 MG) BY MOUTH THREE TIMES DAILY AS NEEDED FOR DIZZINESS 30 tablet 2   omeprazole (PRILOSEC) 40 MG capsule Take 40 mg by mouth 2 (two) times daily.     ondansetron  (ZOFRAN ) 4 MG tablet TAKE 1 TABLET(4 MG) BY MOUTH EVERY 8 HOURS AS NEEDED FOR NAUSEA OR VOMITING 90 tablet 0   promethazine  (PHENERGAN ) 25 MG tablet Take 1 tablet (25 mg total) by mouth every 6 (six) hours as needed for nausea (nausea). 30 tablet 3   rOPINIRole  (REQUIP ) 0.5 MG tablet TAKE 1 TO 2 TABLETS(0.5 TO 1 MG) BY MOUTH AT BEDTIME 180 tablet 1   traMADol  (ULTRAM ) 50 MG tablet TAKE 1 TABLET BY MOUTH FOUR TIMES DAILY AS NEEDED FOR MODERATE PAIN 120 tablet 0   zolpidem  (AMBIEN ) 10 MG tablet TAKE 1 TABLET BY MOUTH AT BEDTIME AS NEEDED FOR SLEEP 30 tablet 5   amphetamine -dextroamphetamine  (ADDERALL) 20 MG tablet Take 1 tablet (20 mg total) by mouth 2 (two) times daily. 60 tablet 0   diazepam  (VALIUM ) 5 MG tablet TAKE 1 TABLET(5 MG) BY MOUTH EVERY 8 HOURS AS NEEDED 90 tablet 5   zolpidem  (AMBIEN ) 10 MG tablet TAKE 1 TABLET BY MOUTH AT BEDTIME AS  NEEDED FOR SLEEP 30 tablet 5   zolpidem  (AMBIEN ) 10 MG tablet TAKE 1 TABLET BY MOUTH AT BEDTIME AS NEEDED FOR SLEEP 30 tablet 0   Facility-Administered Medications Prior to Visit  Medication Dose Route Frequency Provider Last Rate Last Admin   bupivacaine  (MARCAINE ) 0.5 % (with pres) injection 2 mL  2 mL Infiltration Once Lailah Marcelli, Charlie LABOR, MD        PAST MEDICAL HISTORY: Past Medical History:  Diagnosis Date   Anxiety    Depression    Gait instability    uses wheelchair or assistance   H/O steroid therapy    IV infusion every 6 to 8 weeks- last 05-31-14 [facility]   History of breast biopsy    Multiple sclerosis    Neuromuscular disorder (HCC)    MS   PONV (postoperative nausea and vomiting)    Skin cancer     PAST SURGICAL HISTORY: Past Surgical History:  Procedure Laterality Date   ABDOMINAL HYSTERECTOMY     BREAST BIOPSY Right 09/20/2012   Procedure: BREAST BIOPSY WITH NEEDLE LOCALIZATION;  Surgeon: Donnice Bury, MD;  Location: [facility];  Service: General;  Laterality: Right;   BREAST EXCISIONAL BIOPSY Right    benign   CESAREAN SECTION     COLONOSCOPY WITH PROPOFOL  N/A 07/13/2014   Procedure: COLONOSCOPY WITH PROPOFOL ;  Surgeon: Belvie JONETTA Just, MD;  Location: [facility];  Service: Endoscopy;  Laterality: N/A;   COLONOSCOPY WITH PROPOFOL  N/A 12/13/2020   Procedure: COLONOSCOPY WITH PROPOFOL ;  Surgeon: Just Belvie, MD;  Location: [facility];  Service: Endoscopy;  Laterality: N/A;   DEBRIDEMENT SKIN / SQ / MUSCLE OF ARM Right 07/13/2003   I & D; debridement of tissue within triceps muscle   ESOPHAGOGASTRODUODENOSCOPY (EGD) WITH PROPOFOL  N/A 12/13/2020   Procedure: ESOPHAGOGASTRODUODENOSCOPY (EGD) WITH PROPOFOL ;  Surgeon: Just Belvie, MD;  Location: [facility];  Service: Endoscopy;  Laterality: N/A;   LEG SURGERY     sx as a child   MELANOMA EXCISION  PARTIAL HYSTERECTOMY     TONSILLECTOMY     as a child    FAMILY  HISTORY: Family History  Problem Relation Age of Onset   Hyperlipidemia Mother    Hypertension Mother    Allergic rhinitis Father    Hypertension Father    Diabetes type II Father    Allergic rhinitis Sister     SOCIAL HISTORY:  Social History   Socioeconomic History   Marital status: Married    Spouse name: Not on file   Number of children: Not on file   Years of education: Not on file   Highest education level: Not on file  Occupational History   Not on file  Tobacco Use   Smoking status: Never    Passive exposure: Never   Smokeless tobacco: Never  Vaping Use   Vaping status: Never Used  Substance and Sexual Activity   Alcohol use: Not Currently    Comment: social   Drug use: No   Sexual activity: Not on file  Other Topics Concern   Not on file  Social History Narrative   Lives at home w/ her husband   Social Drivers of Corporate Investment Banker Strain: Not on file  Food Insecurity: Not on file  Transportation Needs: Not on file  Physical Activity: Not on file  Stress: Not on file  Social Connections: Not on file  Intimate Partner Violence: Not on file     PHYSICAL EXAM  Vitals:   05/23/24 1400 05/23/24 1408  BP: 137/84 (!) 145/82  Pulse:  74  Resp:  15  SpO2:  99%     There is no height or weight on file to calculate BMI.   General: The patient is well-developed and well-nourished and in no acute distress   Skin/Ext/Musculoskeletal: There is pedal edema..  She has a large sore in the fold of skin at the base of the toes in the left foot.  It is not bleeding at the time I examined it.  Did not look infected.  Neurologic Exam  Mental status: The patient is alert and oriented x 3 at the time of the examination. The patient has apparent normal recent and remote memory, with an apparently normal attention span and concentration ability.   Speech is normal.  Cranial nerves: Extraocular movements are full.  Facial strength and sensation was  normal.  Trapezius strength is normal.. No dysarthria is noted.  No obvious hearing deficits are noted.  Motor:  Muscle bulk and tone are normal.  Strength is 2+/5 left and 3/5 right proximal leg strength.  Strength is 4- to 4/5 distally.  The arms are 4+/5.  Sensory: She has normal sensation to touch and vibration in the arms but reduced touch sensation in the left leg relative to the right  Coordination: Cerebellar testing reveals reduced finger-nose-finger and poor heel-to-shin bilaterally.  Gait and station: She needs support to stand.  She needs bilateral support to stand up.  She is able to assist with transfer to the table  Reflexes: Deep tendon reflexes are symmetric and increased in legs bilaterally with spread at the knees.  She has nonsustained ankle clonus bilaterally.         DIAGNOSTIC DATA (LABS, IMAGING, TESTING) - I reviewed patient records, labs, notes, testing and imaging myself where available.  Lab Results  Component Value Date   WBC 8.5 06/07/2019   HGB 13.9 06/07/2019   HCT 40.8 06/07/2019   MCV 91 06/07/2019   PLT  271 06/07/2019      Component Value Date/Time   NA 144 06/07/2019 1519   K 5.2 06/07/2019 1519   CL 100 06/07/2019 1519   CO2 27 06/07/2019 1519   GLUCOSE 95 06/07/2019 1519   GLUCOSE 104 (H) 03/07/2018 1138   BUN 15 06/07/2019 1519   CREATININE 0.70 06/07/2019 1519   CALCIUM 10.0 06/07/2019 1519   PROT 6.8 04/14/2021 1346   ALBUMIN 4.9 06/07/2019 1519   AST 19 06/07/2019 1519   ALT 13 06/07/2019 1519   ALKPHOS 101 06/07/2019 1519   BILITOT 0.2 06/07/2019 1519   GFRNONAA 94 06/07/2019 1519   GFRAA 109 06/07/2019 1519      ASSESSMENT AND PLAN  Secondary progressive multiple sclerosis  Vertigo  Ataxic gait  Falls  Other fatigue  Dysesthesia  Erythromelalgia  Urinary dysfunction  Medically necessary to obtain a powered wheelchair.  She is unable to walk independently or with a cane and can take just a couple steps  with a walker.  She can transfer independently but has had multiple falls from her scooter that she is currently using.  Therefore, a scooter would not allow her to do activities of daily living safely.  Hand controls could be on either side power wheelchair based on her choice.  She has the ability to operate a power wheelchair safely and is motivated to do so. It is also medically necessary for her to get a hospital bed.  This should have power controls for hand elevation.  A trapeze would be helpful to allow her to get in and out and reposition more safely She remains off a disease modifying therapy.  She will Continue IV Solu-Medrol  every 6-8 weeks as needed\ Continue Adderall  for  her fatigue and attention deficit.  New prescription sent in..   Diazepam  nightly for spasticity and insomnia.  Tramadol  for pain.   Ondansetron  one po prn daily for nausea.    Continue duloxetine   Can do  Brandt-Daroff exercises if vertigo persists.  She will return to see me in 6 months or sooner if she has new or worsening neurologic symptoms.    This visit is part of a comprehensive longitudinal care medical relationship regarding the patients primary diagnosis of MS and related concerns.   Quasean Frye A. Vear, MD, PhD 05/23/2024, 2:52 PM Certified in Neurology, Clinical Neurophysiology, Sleep Medicine, Pain Medicine and Neuroimaging  [facility] [address] [place], [place] [address] [phone]  "

## 2024-05-23 NOTE — Telephone Encounter (Signed)
 Maria from [facility] called to follow up about getting verbal for PT  [facility] .   Hadassah stated she was aware pt had appt today  that why she called for verbal order  For PT [id number]  Starting this week  Call back number is  [phone]

## 2024-05-24 DIAGNOSIS — G35C1 Active secondary progressive multiple sclerosis: Secondary | ICD-10-CM | POA: Diagnosis not present

## 2024-05-24 DIAGNOSIS — F419 Anxiety disorder, unspecified: Secondary | ICD-10-CM | POA: Diagnosis not present

## 2024-05-24 DIAGNOSIS — H8110 Benign paroxysmal vertigo, unspecified ear: Secondary | ICD-10-CM | POA: Diagnosis not present

## 2024-05-24 DIAGNOSIS — Z79891 Long term (current) use of opiate analgesic: Secondary | ICD-10-CM | POA: Diagnosis not present

## 2024-05-24 DIAGNOSIS — I7381 Erythromelalgia: Secondary | ICD-10-CM | POA: Diagnosis not present

## 2024-05-24 DIAGNOSIS — G44229 Chronic tension-type headache, not intractable: Secondary | ICD-10-CM | POA: Diagnosis not present

## 2024-05-24 DIAGNOSIS — F988 Other specified behavioral and emotional disorders with onset usually occurring in childhood and adolescence: Secondary | ICD-10-CM | POA: Diagnosis not present

## 2024-05-24 DIAGNOSIS — R21 Rash and other nonspecific skin eruption: Secondary | ICD-10-CM | POA: Diagnosis not present

## 2024-05-24 DIAGNOSIS — Z90710 Acquired absence of both cervix and uterus: Secondary | ICD-10-CM | POA: Diagnosis not present

## 2024-05-24 DIAGNOSIS — Z9181 History of falling: Secondary | ICD-10-CM | POA: Diagnosis not present

## 2024-05-24 DIAGNOSIS — Z791 Long term (current) use of non-steroidal anti-inflammatories (NSAID): Secondary | ICD-10-CM | POA: Diagnosis not present

## 2024-05-24 DIAGNOSIS — F32A Depression, unspecified: Secondary | ICD-10-CM | POA: Diagnosis not present

## 2024-05-24 DIAGNOSIS — Z9089 Acquired absence of other organs: Secondary | ICD-10-CM | POA: Diagnosis not present

## 2024-05-24 DIAGNOSIS — G500 Trigeminal neuralgia: Secondary | ICD-10-CM | POA: Diagnosis not present

## 2024-05-24 DIAGNOSIS — Z85828 Personal history of other malignant neoplasm of skin: Secondary | ICD-10-CM | POA: Diagnosis not present

## 2024-05-24 NOTE — Telephone Encounter (Signed)
 Called and spoke to [place], PT [facility]  and stated that we are agreeable to verbal orders. Hadassah voiced gratitude and understanding

## 2024-05-26 DIAGNOSIS — Z9181 History of falling: Secondary | ICD-10-CM | POA: Diagnosis not present

## 2024-05-26 DIAGNOSIS — Z9089 Acquired absence of other organs: Secondary | ICD-10-CM | POA: Diagnosis not present

## 2024-05-26 DIAGNOSIS — G44229 Chronic tension-type headache, not intractable: Secondary | ICD-10-CM | POA: Diagnosis not present

## 2024-05-26 DIAGNOSIS — G35C1 Active secondary progressive multiple sclerosis: Secondary | ICD-10-CM | POA: Diagnosis not present

## 2024-05-26 DIAGNOSIS — I7381 Erythromelalgia: Secondary | ICD-10-CM | POA: Diagnosis not present

## 2024-05-26 DIAGNOSIS — Z791 Long term (current) use of non-steroidal anti-inflammatories (NSAID): Secondary | ICD-10-CM | POA: Diagnosis not present

## 2024-05-26 DIAGNOSIS — Z85828 Personal history of other malignant neoplasm of skin: Secondary | ICD-10-CM | POA: Diagnosis not present

## 2024-05-26 DIAGNOSIS — Z90710 Acquired absence of both cervix and uterus: Secondary | ICD-10-CM | POA: Diagnosis not present

## 2024-05-26 DIAGNOSIS — H8110 Benign paroxysmal vertigo, unspecified ear: Secondary | ICD-10-CM | POA: Diagnosis not present

## 2024-05-26 DIAGNOSIS — G500 Trigeminal neuralgia: Secondary | ICD-10-CM | POA: Diagnosis not present

## 2024-05-26 DIAGNOSIS — F32A Depression, unspecified: Secondary | ICD-10-CM | POA: Diagnosis not present

## 2024-05-26 DIAGNOSIS — F419 Anxiety disorder, unspecified: Secondary | ICD-10-CM | POA: Diagnosis not present

## 2024-05-26 DIAGNOSIS — Z79891 Long term (current) use of opiate analgesic: Secondary | ICD-10-CM | POA: Diagnosis not present

## 2024-05-26 DIAGNOSIS — R21 Rash and other nonspecific skin eruption: Secondary | ICD-10-CM | POA: Diagnosis not present

## 2024-05-26 DIAGNOSIS — F988 Other specified behavioral and emotional disorders with onset usually occurring in childhood and adolescence: Secondary | ICD-10-CM | POA: Diagnosis not present

## 2024-05-30 ENCOUNTER — Other Ambulatory Visit: Payer: Self-pay | Admitting: Diagnostic Neuroimaging

## 2024-06-14 ENCOUNTER — Other Ambulatory Visit: Payer: Self-pay | Admitting: Neurology

## 2024-06-15 NOTE — Telephone Encounter (Signed)
 Medication: Ambien  Directions: TAKE 1 TABLET BY MOUTH AT BEDTIME AS NEEDED FOR SLEEP,  Last given: 12/14/2023 Number refills: 5 Last o/v:  Follow up:  Labs:    Please review refill request.

## 2024-06-16 ENCOUNTER — Other Ambulatory Visit: Payer: Self-pay | Admitting: Neurology

## 2024-06-26 ENCOUNTER — Telehealth: Payer: Self-pay | Admitting: Neurology

## 2024-06-26 ENCOUNTER — Encounter: Payer: Self-pay | Admitting: Neurology

## 2024-06-26 NOTE — Telephone Encounter (Signed)
 Tonya Powers from [facility] called needing VO for OT for the pt with the frequency of 1x 9w Please advise.

## 2024-06-27 ENCOUNTER — Other Ambulatory Visit: Payer: Self-pay

## 2024-07-05 NOTE — Telephone Encounter (Signed)
 LVM with VO for OT for pt 1x 9w

## 2024-07-06 ENCOUNTER — Encounter: Payer: Self-pay | Admitting: Neurology

## 2024-07-07 NOTE — Addendum Note (Signed)
 Addended by: VEAR ADE A on: 07/07/2024 11:26 AM   Modules accepted: Orders

## 2024-07-10 ENCOUNTER — Encounter: Payer: Self-pay | Admitting: Neurology

## 2024-07-10 MED ORDER — AMPHETAMINE-DEXTROAMPHETAMINE 20 MG PO TABS: 20.0000 mg | ORAL_TABLET | Freq: Two times a day (BID) | ORAL | 0 refills | Status: AC

## 2024-07-10 NOTE — Telephone Encounter (Signed)
 Sent a message to find out who I can route non cpap dme to in epic:

## 2024-07-10 NOTE — Telephone Encounter (Signed)
 Tonya Powers

## 2024-07-10 NOTE — Telephone Encounter (Signed)
 Requested Prescriptions   Pending Prescriptions Disp Refills   amphetamine -dextroamphetamine  (ADDERALL) 20 MG tablet 60 tablet 0    Sig: Take 1 tablet (20 mg total) by mouth 2 (two) times daily.   Last seen 05/23/24 Next appt 12/28/24  Dispenses   Dispensed Days Supply Quantity Provider Pharmacy  D-AMPHETAMINE  SALT COMBO 20MG  TABS 05/23/2024 30 60 each Sater, Charlie LABOR, MD [facility] #...  D-AMPHETAMINE  SALT COMBO 20MG  TABS 04/10/2024 30 60 each Sater, Charlie LABOR, MD [facility] #...  D-AMPHETAMINE  SALT COMBO 20MG  TABS 03/02/2024 30 60 each Sater, Charlie LABOR, MD [facility] #...  D-AMPHETAMINE  SALT COMBO 20MG  TABS 01/27/2024 30 60 each Sater, Charlie LABOR, MD [facility] STORE #...  D-AMPHETAMINE  SALT COMBO 20MG  TABS 12/15/2023 30 60 each Sater, Charlie LABOR, MD [facility] #...  D-AMPHETAMINE  SALT COMBO 20MG  TABS 11/12/2023 30 60 each Dohmeier, Dedra, MD [facility] DRUG STORE #...  D-AMPHETAMINE  SALT COMBO 20MG  TABS 10/12/2023 30 60 each Sater, Charlie LABOR, MD [facility] DRUG STORE #...  D-AMPHETAMINE  SALT COMBO 20MG  TABS 09/07/2023 30 60 each Sater, Charlie LABOR, MD [facility] DRUG STORE #...  D-AMPHETAMINE  SALT COMBO 20MG  TABS 07/28/2023 30 60 each Sater, Charlie LABOR, MD [facility] DR[facility] STORE #.[email or web address]

## 2024-08-02 NOTE — Telephone Encounter (Signed)
 Kristen from [phone]) xt [phone] states re: the order for the Hospital bed and the trapeze bar there must be an office visit or an E visit before the order can be filled.

## 2024-12-28 ENCOUNTER — Ambulatory Visit: Admitting: Neurology
